# Patient Record
Sex: Male | Born: 1955 | Race: Black or African American | Hispanic: No | Smoking: Current every day smoker
Health system: Southern US, Community
[De-identification: ages and names within clinical notes are randomized; demographics above are authoritative.]

## PROBLEM LIST (undated history)

## (undated) DIAGNOSIS — R635 Abnormal weight gain: Secondary | ICD-10-CM

## (undated) DIAGNOSIS — I498 Other specified cardiac arrhythmias: Secondary | ICD-10-CM

## (undated) DIAGNOSIS — I10 Essential (primary) hypertension: Secondary | ICD-10-CM

## (undated) DIAGNOSIS — I509 Heart failure, unspecified: Secondary | ICD-10-CM

## (undated) DIAGNOSIS — J449 Chronic obstructive pulmonary disease, unspecified: Secondary | ICD-10-CM

## (undated) DIAGNOSIS — E785 Hyperlipidemia, unspecified: Secondary | ICD-10-CM

## (undated) DIAGNOSIS — I160 Hypertensive urgency: Secondary | ICD-10-CM

## (undated) DIAGNOSIS — N529 Male erectile dysfunction, unspecified: Secondary | ICD-10-CM

## (undated) DIAGNOSIS — Z720 Tobacco use: Secondary | ICD-10-CM

## (undated) DIAGNOSIS — R05 Cough: Secondary | ICD-10-CM

## (undated) DIAGNOSIS — F1010 Alcohol abuse, uncomplicated: Secondary | ICD-10-CM

## (undated) DIAGNOSIS — M25511 Pain in right shoulder: Secondary | ICD-10-CM

## (undated) HISTORY — DX: Pain in right shoulder: M25.511

## (undated) HISTORY — DX: Essential (primary) hypertension: I10

## (undated) HISTORY — DX: Tobacco use: Z72.0

## (undated) HISTORY — PX: COLONOSCOPY: SHX174

## (undated) HISTORY — DX: Hypertensive urgency: I16.0

## (undated) HISTORY — DX: Hyperlipidemia, unspecified: E78.5

## (undated) HISTORY — DX: Abnormal weight gain: R63.5

## (undated) HISTORY — DX: Heart failure, unspecified: I50.9

## (undated) HISTORY — DX: Male erectile dysfunction, unspecified: N52.9

## (undated) HISTORY — DX: Cough: R05

## (undated) HISTORY — DX: Other specified cardiac arrhythmias: I49.8

## (undated) HISTORY — DX: Alcohol abuse, uncomplicated: F10.10

---

## 1998-01-30 ENCOUNTER — Encounter: Admission: RE | Admit: 1998-01-30 | Discharge: 1998-01-30 | Payer: Self-pay | Admitting: Internal Medicine

## 1998-05-21 ENCOUNTER — Encounter: Admission: RE | Admit: 1998-05-21 | Discharge: 1998-05-21 | Payer: Self-pay | Admitting: Internal Medicine

## 1999-01-25 ENCOUNTER — Encounter: Admission: RE | Admit: 1999-01-25 | Discharge: 1999-01-25 | Payer: Self-pay | Admitting: Internal Medicine

## 1999-02-12 ENCOUNTER — Encounter: Admission: RE | Admit: 1999-02-12 | Discharge: 1999-02-12 | Payer: Self-pay | Admitting: Internal Medicine

## 2000-01-21 ENCOUNTER — Encounter: Admission: RE | Admit: 2000-01-21 | Discharge: 2000-01-21 | Payer: Self-pay | Admitting: Internal Medicine

## 2001-01-12 ENCOUNTER — Encounter: Admission: RE | Admit: 2001-01-12 | Discharge: 2001-01-12 | Payer: Self-pay | Admitting: Internal Medicine

## 2002-01-25 ENCOUNTER — Encounter: Admission: RE | Admit: 2002-01-25 | Discharge: 2002-01-25 | Payer: Self-pay | Admitting: Internal Medicine

## 2002-10-18 ENCOUNTER — Encounter: Admission: RE | Admit: 2002-10-18 | Discharge: 2002-10-18 | Payer: Self-pay | Admitting: Internal Medicine

## 2002-10-28 ENCOUNTER — Encounter: Admission: RE | Admit: 2002-10-28 | Discharge: 2002-10-28 | Payer: Self-pay | Admitting: Internal Medicine

## 2003-02-28 ENCOUNTER — Encounter: Admission: RE | Admit: 2003-02-28 | Discharge: 2003-02-28 | Payer: Self-pay | Admitting: Internal Medicine

## 2003-05-30 ENCOUNTER — Encounter: Admission: RE | Admit: 2003-05-30 | Discharge: 2003-05-30 | Payer: Self-pay | Admitting: Internal Medicine

## 2003-05-30 ENCOUNTER — Ambulatory Visit (HOSPITAL_COMMUNITY): Admission: RE | Admit: 2003-05-30 | Discharge: 2003-05-30 | Payer: Self-pay | Admitting: Internal Medicine

## 2003-06-16 ENCOUNTER — Encounter: Admission: RE | Admit: 2003-06-16 | Discharge: 2003-06-16 | Payer: Self-pay | Admitting: Internal Medicine

## 2003-10-31 ENCOUNTER — Encounter: Admission: RE | Admit: 2003-10-31 | Discharge: 2003-10-31 | Payer: Self-pay | Admitting: Internal Medicine

## 2004-01-01 ENCOUNTER — Encounter: Admission: RE | Admit: 2004-01-01 | Discharge: 2004-01-01 | Payer: Self-pay | Admitting: Internal Medicine

## 2004-01-05 ENCOUNTER — Encounter: Admission: RE | Admit: 2004-01-05 | Discharge: 2004-01-05 | Payer: Self-pay | Admitting: Internal Medicine

## 2004-04-08 ENCOUNTER — Ambulatory Visit: Payer: Self-pay | Admitting: Internal Medicine

## 2004-12-24 ENCOUNTER — Ambulatory Visit: Payer: Self-pay | Admitting: Internal Medicine

## 2004-12-27 ENCOUNTER — Ambulatory Visit: Payer: Self-pay | Admitting: Internal Medicine

## 2005-09-19 ENCOUNTER — Ambulatory Visit: Payer: Self-pay | Admitting: Internal Medicine

## 2005-10-28 ENCOUNTER — Ambulatory Visit (HOSPITAL_COMMUNITY): Admission: RE | Admit: 2005-10-28 | Discharge: 2005-10-28 | Payer: Self-pay | Admitting: Internal Medicine

## 2005-10-28 ENCOUNTER — Ambulatory Visit: Payer: Self-pay | Admitting: Internal Medicine

## 2005-11-11 ENCOUNTER — Ambulatory Visit (HOSPITAL_COMMUNITY): Admission: RE | Admit: 2005-11-11 | Discharge: 2005-11-11 | Payer: Self-pay | Admitting: Internal Medicine

## 2005-11-11 ENCOUNTER — Encounter (INDEPENDENT_AMBULATORY_CARE_PROVIDER_SITE_OTHER): Payer: Self-pay | Admitting: Cardiology

## 2005-11-28 ENCOUNTER — Ambulatory Visit: Payer: Self-pay | Admitting: Internal Medicine

## 2006-07-27 DIAGNOSIS — F17200 Nicotine dependence, unspecified, uncomplicated: Secondary | ICD-10-CM | POA: Insufficient documentation

## 2006-07-27 DIAGNOSIS — E785 Hyperlipidemia, unspecified: Secondary | ICD-10-CM | POA: Insufficient documentation

## 2006-07-27 DIAGNOSIS — F1021 Alcohol dependence, in remission: Secondary | ICD-10-CM | POA: Insufficient documentation

## 2006-07-27 DIAGNOSIS — F528 Other sexual dysfunction not due to a substance or known physiological condition: Secondary | ICD-10-CM | POA: Insufficient documentation

## 2006-07-27 DIAGNOSIS — R635 Abnormal weight gain: Secondary | ICD-10-CM | POA: Insufficient documentation

## 2006-07-27 DIAGNOSIS — I10 Essential (primary) hypertension: Secondary | ICD-10-CM | POA: Insufficient documentation

## 2006-11-16 ENCOUNTER — Encounter (INDEPENDENT_AMBULATORY_CARE_PROVIDER_SITE_OTHER): Payer: Self-pay | Admitting: Internal Medicine

## 2006-11-20 ENCOUNTER — Telehealth (INDEPENDENT_AMBULATORY_CARE_PROVIDER_SITE_OTHER): Payer: Self-pay | Admitting: *Deleted

## 2006-11-26 ENCOUNTER — Telehealth: Payer: Self-pay | Admitting: *Deleted

## 2006-12-15 ENCOUNTER — Ambulatory Visit: Payer: Self-pay | Admitting: Internal Medicine

## 2006-12-23 LAB — CONVERTED CEMR LAB
ALT: 30 units/L (ref 0–53)
AST: 28 units/L (ref 0–37)
Albumin: 4.7 g/dL (ref 3.5–5.2)
Alkaline Phosphatase: 106 units/L (ref 39–117)
BUN: 9 mg/dL (ref 6–23)
CO2: 27 meq/L (ref 19–32)
Calcium: 9.9 mg/dL (ref 8.4–10.5)
Chloride: 104 meq/L (ref 96–112)
Cholesterol: 120 mg/dL (ref 0–200)
Creatinine, Ser: 1.02 mg/dL (ref 0.40–1.50)
Glucose, Bld: 92 mg/dL (ref 70–99)
HDL: 41 mg/dL (ref >39)
LDL Cholesterol: 61 mg/dL (ref 0–99)
Potassium: 4.5 meq/L (ref 3.5–5.3)
Sodium: 142 meq/L (ref 135–145)
Total Bilirubin: 0.4 mg/dL (ref 0.3–1.2)
Total CHOL/HDL Ratio: 2.9
Total Protein: 7.2 g/dL (ref 6.0–8.3)
Triglycerides: 89 mg/dL (ref <150)
VLDL: 18 mg/dL (ref 0–40)

## 2007-01-06 ENCOUNTER — Encounter (INDEPENDENT_AMBULATORY_CARE_PROVIDER_SITE_OTHER): Payer: Self-pay | Admitting: Internal Medicine

## 2007-01-22 ENCOUNTER — Encounter (INDEPENDENT_AMBULATORY_CARE_PROVIDER_SITE_OTHER): Payer: Self-pay | Admitting: Internal Medicine

## 2007-08-24 ENCOUNTER — Ambulatory Visit: Payer: Self-pay | Admitting: Internal Medicine

## 2007-08-24 DIAGNOSIS — T7840XA Allergy, unspecified, initial encounter: Secondary | ICD-10-CM | POA: Insufficient documentation

## 2007-10-15 ENCOUNTER — Telehealth: Payer: Self-pay | Admitting: *Deleted

## 2007-12-20 ENCOUNTER — Telehealth: Payer: Self-pay | Admitting: *Deleted

## 2007-12-28 ENCOUNTER — Ambulatory Visit: Payer: Self-pay | Admitting: Internal Medicine

## 2007-12-31 ENCOUNTER — Ambulatory Visit: Payer: Self-pay | Admitting: Internal Medicine

## 2007-12-31 LAB — CONVERTED CEMR LAB
ALT: 20 units/L (ref 0–53)
AST: 27 units/L (ref 0–37)
Albumin: 4.8 g/dL (ref 3.5–5.2)
Alkaline Phosphatase: 102 units/L (ref 39–117)
BUN: 15 mg/dL (ref 6–23)
CO2: 23 meq/L (ref 19–32)
Calcium: 9.5 mg/dL (ref 8.4–10.5)
Chloride: 108 meq/L (ref 96–112)
Cholesterol: 119 mg/dL (ref 0–200)
Creatinine, Ser: 0.93 mg/dL (ref 0.40–1.50)
Glucose, Bld: 99 mg/dL (ref 70–99)
HDL: 39 mg/dL — ABNORMAL LOW (ref >39)
LDL Cholesterol: 67 mg/dL (ref 0–99)
Potassium: 4.3 meq/L (ref 3.5–5.3)
Sodium: 144 meq/L (ref 135–145)
Total Bilirubin: 0.4 mg/dL (ref 0.3–1.2)
Total CHOL/HDL Ratio: 3.1
Total Protein: 7.5 g/dL (ref 6.0–8.3)
Triglycerides: 67 mg/dL (ref <150)
VLDL: 13 mg/dL (ref 0–40)

## 2008-01-11 ENCOUNTER — Ambulatory Visit: Payer: Self-pay | Admitting: Internal Medicine

## 2008-01-12 LAB — CONVERTED CEMR LAB
BUN: 12 mg/dL (ref 6–23)
CO2: 26 meq/L (ref 19–32)
Calcium: 9.6 mg/dL (ref 8.4–10.5)
Chloride: 107 meq/L (ref 96–112)
Creatinine, Ser: 0.97 mg/dL (ref 0.40–1.50)
Glucose, Bld: 110 mg/dL — ABNORMAL HIGH (ref 70–99)
Potassium: 4 meq/L (ref 3.5–5.3)
Sodium: 143 meq/L (ref 135–145)

## 2008-03-07 ENCOUNTER — Ambulatory Visit: Payer: Self-pay | Admitting: Internal Medicine

## 2008-03-07 DIAGNOSIS — M79609 Pain in unspecified limb: Secondary | ICD-10-CM | POA: Insufficient documentation

## 2008-03-08 LAB — CONVERTED CEMR LAB
BUN: 11 mg/dL (ref 6–23)
CO2: 26 meq/L (ref 19–32)
Calcium: 9.9 mg/dL (ref 8.4–10.5)
Chloride: 107 meq/L (ref 96–112)
Creatinine, Ser: 1.1 mg/dL (ref 0.40–1.50)
Glucose, Bld: 96 mg/dL (ref 70–99)
Magnesium: 2.2 mg/dL (ref 1.5–2.5)
Potassium: 4 meq/L (ref 3.5–5.3)
Sodium: 142 meq/L (ref 135–145)
TSH: 1.227 microintl units/mL (ref 0.350–4.50)
Total CK: 439 units/L — ABNORMAL HIGH (ref 7–232)
Vitamin B-12: 613 pg/mL (ref 211–911)

## 2008-04-25 ENCOUNTER — Ambulatory Visit: Payer: Self-pay | Admitting: Internal Medicine

## 2008-04-28 ENCOUNTER — Ambulatory Visit: Payer: Self-pay | Admitting: Internal Medicine

## 2008-05-05 LAB — CONVERTED CEMR LAB
Cholesterol: 194 mg/dL (ref 0–200)
HDL: 45 mg/dL (ref >39)
LDL Cholesterol: 127 mg/dL — ABNORMAL HIGH (ref 0–99)
Total CHOL/HDL Ratio: 4.3
Total CK: 498 units/L — ABNORMAL HIGH (ref 7–232)
Triglycerides: 110 mg/dL (ref <150)
VLDL: 22 mg/dL (ref 0–40)

## 2008-05-22 ENCOUNTER — Encounter (INDEPENDENT_AMBULATORY_CARE_PROVIDER_SITE_OTHER): Payer: Self-pay | Admitting: Internal Medicine

## 2008-06-30 ENCOUNTER — Ambulatory Visit: Payer: Self-pay | Admitting: Internal Medicine

## 2008-07-03 LAB — CONVERTED CEMR LAB
Cholesterol: 116 mg/dL (ref 0–200)
HDL: 36 mg/dL — ABNORMAL LOW (ref >39)
LDL Cholesterol: 64 mg/dL (ref 0–99)
Total CHOL/HDL Ratio: 3.2
Total CK: 499 units/L — ABNORMAL HIGH (ref 7–232)
Triglycerides: 80 mg/dL (ref <150)
VLDL: 16 mg/dL (ref 0–40)

## 2008-12-26 ENCOUNTER — Ambulatory Visit: Payer: Self-pay | Admitting: Internal Medicine

## 2008-12-27 DIAGNOSIS — R748 Abnormal levels of other serum enzymes: Secondary | ICD-10-CM | POA: Insufficient documentation

## 2008-12-27 LAB — CONVERTED CEMR LAB
ALT: 26 units/L (ref 0–53)
AST: 33 units/L (ref 0–37)
Albumin: 4.3 g/dL (ref 3.5–5.2)
Alkaline Phosphatase: 107 units/L (ref 39–117)
BUN: 12 mg/dL (ref 6–23)
CO2: 24 meq/L (ref 19–32)
Calcium: 9.6 mg/dL (ref 8.4–10.5)
Chloride: 106 meq/L (ref 96–112)
Creatinine, Ser: 1 mg/dL (ref 0.40–1.50)
GFR calc Af Amer: >60 mL/min (ref >60)
GFR calc non Af Amer: >60 mL/min (ref >60)
Glucose, Bld: 101 mg/dL — ABNORMAL HIGH (ref 70–99)
Potassium: 4.1 meq/L (ref 3.5–5.3)
Sodium: 141 meq/L (ref 135–145)
Total Bilirubin: 0.3 mg/dL (ref 0.3–1.2)
Total CK: 606 units/L — ABNORMAL HIGH (ref 7–232)
Total Protein: 6.9 g/dL (ref 6.0–8.3)

## 2009-01-01 ENCOUNTER — Telehealth: Payer: Self-pay

## 2009-02-13 ENCOUNTER — Ambulatory Visit: Payer: Self-pay | Admitting: Internal Medicine

## 2009-02-14 LAB — CONVERTED CEMR LAB
BUN: 12 mg/dL (ref 6–23)
CO2: 26 meq/L (ref 19–32)
Calcium: 9.1 mg/dL (ref 8.4–10.5)
Chloride: 107 meq/L (ref 96–112)
Creatinine, Ser: 0.94 mg/dL (ref 0.40–1.50)
Glucose, Bld: 88 mg/dL (ref 70–99)
Potassium: 4.1 meq/L (ref 3.5–5.3)
Sodium: 138 meq/L (ref 135–145)

## 2009-03-08 ENCOUNTER — Encounter (INDEPENDENT_AMBULATORY_CARE_PROVIDER_SITE_OTHER): Payer: Self-pay | Admitting: Internal Medicine

## 2009-03-08 LAB — HM COLONOSCOPY

## 2009-07-14 HISTORY — PX: LYMPH NODE BIOPSY: SHX201

## 2009-11-15 ENCOUNTER — Telehealth: Payer: Self-pay | Admitting: Internal Medicine

## 2010-02-22 ENCOUNTER — Ambulatory Visit: Payer: Self-pay | Admitting: Internal Medicine

## 2010-02-28 ENCOUNTER — Telehealth: Payer: Self-pay | Admitting: Licensed Clinical Social Worker

## 2010-03-06 ENCOUNTER — Encounter: Payer: Self-pay | Admitting: Licensed Clinical Social Worker

## 2010-03-29 ENCOUNTER — Ambulatory Visit: Payer: Self-pay | Admitting: Internal Medicine

## 2010-04-12 ENCOUNTER — Ambulatory Visit: Payer: Self-pay | Admitting: Internal Medicine

## 2010-04-12 LAB — CONVERTED CEMR LAB
BUN: 9 mg/dL (ref 6–23)
CO2: 27 meq/L (ref 19–32)
Calcium: 9.4 mg/dL (ref 8.4–10.5)
Chloride: 104 meq/L (ref 96–112)
Cholesterol: 100 mg/dL (ref 0–200)
Creatinine, Ser: 1.04 mg/dL (ref 0.40–1.50)
Glucose, Bld: 90 mg/dL (ref 70–99)
HDL: 42 mg/dL (ref >39)
LDL Cholesterol: 48 mg/dL (ref 0–99)
Potassium: 4.5 meq/L (ref 3.5–5.3)
Sodium: 141 meq/L (ref 135–145)
Total CHOL/HDL Ratio: 2.4
Total CK: 453 units/L — ABNORMAL HIGH (ref 7–232)
Triglycerides: 48 mg/dL (ref <150)
VLDL: 10 mg/dL (ref 0–40)

## 2010-07-30 ENCOUNTER — Telehealth (INDEPENDENT_AMBULATORY_CARE_PROVIDER_SITE_OTHER): Payer: Self-pay | Admitting: *Deleted

## 2010-07-30 ENCOUNTER — Telehealth: Payer: Self-pay | Admitting: *Deleted

## 2010-08-05 ENCOUNTER — Emergency Department (HOSPITAL_COMMUNITY)
Admission: EM | Admit: 2010-08-05 | Discharge: 2010-08-05 | Payer: Self-pay | Source: Home / Self Care | Admitting: Family Medicine

## 2010-08-15 NOTE — Progress Notes (Addendum)
Summary: refill/gg  Phone Note Refill Request  on July 30, 2010 4:51 PM  Refills Requested: Medication #1:  NORVASC 10 MG TABS Take 1 tablet by mouth once a day  Medication #2:  CRESTOR 10 MG TABS Take 1 tablet by mouth once a day  Medication #3:  COREG 6.25 MG TABS Take 1 tablet by mouth two times a day  Method Requested: Electronic Initial call taken by: Merrie Roof RN,  July 30, 2010 4:52 PM  Follow-up for Phone Call        Refilled electronically. Please schedule an appointment with patient's primary MD.  Follow-up by: Margarito Liner MD,  August 02, 2010 3:43 PM    Prescriptions: COREG 6.25 MG TABS (CARVEDILOL) Take 1 tablet by mouth two times a day  #62 x 1   Entered and Authorized by:   Margarito Liner MD   Signed by:   Margarito Liner MD on 08/02/2010   Method used:   Electronically to        [facility] (retail)       [address]       [place], [place]  [id number]       Ph: [phone]       Fax: [phone]   RxID:   [id number] CRESTOR 10 MG TABS (ROSUVASTATIN CALCIUM) Take 1 tablet by mouth once a day  #31 Tablet x 1   Entered and Authorized by:   Margarito Liner MD   Signed by:   Margarito Liner MD on 08/02/2010   Method used:   Electronically to        [facility] (retail)       [address]       [place], [place]  [address]       Ph: [phone]       Fax: [phone]   RxID:   [id number] NORVASC 10 MG TABS (AMLODIPINE BESYLATE) Take 1 tablet by mouth once a day  #31 Tablet x 1   Entered and Authorized by:   Margarito Liner MD   Signed by:   Margarito Liner MD on 08/02/2010   Method used:   Electronically to        [facility] (retail)       [address]       [place], [place]  [address]       Ph: [phone]       Fax: [phone]   RxID:   [id number]

## 2010-08-15 NOTE — Progress Notes (Signed)
Summary: medrefill/gp  Phone Note Refill Request Message from:  Fax from Pharmacy on December 20, 2007 9:57 AM  Refills Requested: Medication #1:  CRESTOR 10 MG TABS Take 1 tablet by mouth once a day   Last Refilled: 11/04/2007  Method Requested: electronic Initial call taken by: Chinita Pester RN,  December 20, 2007 9:57 AM  Follow-up for Phone Call        I sent refills but please call pt and tell him he needs to come in for FASTING labs in the next 1-2 weeks. Doesn't have to see me, just get labs done. Thanks. Follow-up by: Ned Grace MD,  December 20, 2007 10:18 AM  Additional Follow-up for Phone Call Additional follow up Details #1::        Pt. called - no one at home/ no answering machine. I will send flag to Chilon's desk top to make an appt. Additional Follow-up by: Chinita Pester RN,  December 20, 2007 3:12 PM      Prescriptions: CRESTOR 10 MG TABS (ROSUVASTATIN CALCIUM) Take 1 tablet by mouth once a day  #30 x 5   Entered and Authorized by:   Ned Grace MD   Signed by:   Ned Grace MD on 12/20/2007   Method used:   Electronically sent to ...       [id number]       [address]       [place], [place]  [address]       Ph: [phone] or [phone]       Fax: [phone]   RxID:   [id number]

## 2010-08-15 NOTE — Consult Note (Signed)
Summary: Ortho-Dr. Yisroel Ramming  Ortho-Dr. Yisroel Ramming   Imported By: Dorice Lamas 12/16/2006 10:31:54  _____________________________________________________________________  External Attachment:    Type:   Image     Comment:   External Document

## 2010-08-15 NOTE — Assessment & Plan Note (Signed)
Summary: Devon Vega/2 WEEK RECHECK/CH   Vital Signs:  Patient profile:   55 year old male Height:      66 inches (167.64 cm) Weight:      167.0 pounds (75.91 kg) BMI:     27.05 Temp:     98.5 degrees F (36.94 degrees C) oral Pulse rate:   65 / minute BP sitting:   130 / 83  (left arm)  Vitals Entered By: Chinita Pester RN (April 12, 2010 11:22 AM) CC: F/u visit- started on new BP at the last visit; need refill. Request something for cold or allergies. Is Patient Diabetic? No Pain Assessment Patient in pain? no      Nutritional Status BMI of 25 - 29 = overweight  Have you ever been in a relationship where you felt threatened, hurt or afraid?No   Does patient need assistance? Functional Status Self care Ambulation Normal   Primary Care Provider:  Vassie Loll MD  CC:  F/u visit- started on new BP at the last visit; need refill. Request something for cold or allergies..  History of Present Illness: 55 y/o man with HTN, HLD return for follow up. Recently started on coreg for BP control after he could not tolerate other 1st line medication. continues to drink 6 beers every weekend. no other complaints.       Depression History:      The patient denies a depressed mood most of the day and a diminished interest in his usual daily activities.         Preventive Screening-Counseling & Management  Alcohol-Tobacco     Alcohol drinks/day: moderation     Smoking Status: current     Smoking Cessation Counseling: yes     Packs/Day: 1/2 a day     Year Started: 35 years  Caffeine-Diet-Exercise     Does Patient Exercise: yes     Type of exercise: BIKE  Current Medications (verified): 1)  Norvasc 10 Mg Tabs (Amlodipine Besylate) .... Take 1 Tablet By Mouth Once A Day 2)  Crestor 10 Mg Tabs (Rosuvastatin Calcium) .... Take 1 Tablet By Mouth Once A Day 3)  Nasonex 50 Mcg/act Susp (Mometasone Furoate) .... 2 Sprays Each Nostril Once Daily As Needed For Allergy Symptoms 4)   Coreg 6.25 Mg Tabs (Carvedilol) .... Take 1 Tablet By Mouth Two Times A Day  Allergies (verified): 1)  ! Codeine 2)  ! Prinivil (Lisinopril) 3)  ! Hydrochlorothiazide (Hydrochlorothiazide)  Review of Systems  The patient denies anorexia, fever, weight loss, weight gain, vision loss, decreased hearing, hoarseness, chest pain, syncope, dyspnea on exertion, peripheral edema, prolonged cough, headaches, hemoptysis, abdominal pain, melena, hematochezia, severe indigestion/heartburn, hematuria, incontinence, genital sores, muscle weakness, suspicious skin lesions, transient blindness, difficulty walking, depression, unusual weight change, abnormal bleeding, enlarged lymph nodes, angioedema, breast masses, and testicular masses.    Physical Exam  General:  alert and well-developed.  alert and well-developed.   Head:  normocephalic.  normocephalic.   Neck:  supple.   Lungs:  normal respiratory effort and normal breath sounds.  normal respiratory effort and normal breath sounds.   Heart:  normal rate and regular rhythm.  normal rate and regular rhythm.   Abdomen:  soft and non-tender.  soft and non-tender.   Pulses:  dorsalis pedis pulses normal bilaterally  Extremities:  no edema Neurologic:  OrientedX3, cranial nerver 2-12 intact,strength good in all extremities, sensations normal to light touch, reflexes 2+ b/l, gait normal    Impression & Recommendations:  Problem #  1:  HYPERTENSION (ICD-401.9) well controlled/   His updated medication list for this problem includes:    Norvasc 10 Mg Tabs (Amlodipine besylate) .Marland Kitchen... Take 1 tablet by mouth once a day    Coreg 6.25 Mg Tabs (Carvedilol) .Marland Kitchen... Take 1 tablet by mouth two times a day  Orders: T-Basic Metabolic Panel ([id number])  BP today: 130/83 Prior BP: 152/87 (03/29/2010)  Labs Reviewed: K+: 4.1 (02/13/2009) Creat: : 0.94 (02/13/2009)   Chol: 116 (06/30/2008)   HDL: 36 (06/30/2008)   LDL: 64 (06/30/2008)   TG: 80  (06/30/2008)  Problem # 2:  HYPERLIPIDEMIA (ICD-272.4) will check lipid profile   His updated medication list for this problem includes:    Crestor 10 Mg Tabs (Rosuvastatin calcium) .Marland Kitchen... Take 1 tablet by mouth once a day  Orders: T-Lipid Profile ([id number])  Problem # 3:  CPK, ABNORMAL (ICD-790.5) will check level again as statin has been restarted.   Orders: T-CK Total [phone])  Problem # 4:  ALCOHOL ABUSE, HX OF (ICD-V11.3) counselled extensively agains alcahol and smoking.   Complete Medication List: 1)  Norvasc 10 Mg Tabs (Amlodipine besylate) .... Take 1 tablet by mouth once a day 2)  Crestor 10 Mg Tabs (Rosuvastatin calcium) .... Take 1 tablet by mouth once a day 3)  Nasonex 50 Mcg/act Susp (Mometasone furoate) .... 2 sprays each nostril once daily as needed for allergy symptoms 4)  Coreg 6.25 Mg Tabs (Carvedilol) .... Take 1 tablet by mouth two times a day 5)  Guaifenesin 100 Mg/29ml Syrp (Guaifenesin) .Marland Kitchen.. 1-2 times a day for cough  Patient Instructions: 1)  Please schedule a follow-up appointment in 2 months. 2)  Get plenty of rest, drink lots of clear liquids, and use Tylenol or Ibuprofen for fever and comfort. Return in 7-10 days if you're not better:sooner if you're feeling worse. Prescriptions: COREG 6.25 MG TABS (CARVEDILOL) Take 1 tablet by mouth two times a day  #62 x 3   Entered and Authorized by:   Bethel Born MD   Signed by:   Bethel Born MD on 04/12/2010   Method used:   Electronically to        [facility] (retail)       [address]       [place], [place]  [id number]       Ph: [phone]       Fax: [phone]   RxID:   [id number] GUAIFENESIN 100 MG/5ML SYRP (GUAIFENESIN) 1-2 times a day for cough  #1 x 1   Entered and Authorized by:   Bethel Born MD   Signed by:   Bethel Born MD on 04/12/2010   Method used:   Electronically to        [facility] (retail)       [address]       [place], [place]  [id number]       Ph: [phone]       Fax: [phone]   RxID:   [id number] CHERATUSSIN AC 100-10 MG/5ML SYRP (GUAIFENESIN-CODEINE) Take 1 tablet by mouth once a day  #15 x 0   Entered and Authorized by:   Bethel Born MD   Signed by:   Bethel Born MD on 04/12/2010   Method used:   Print then Give to Patient   RxID:   [id number]   Prevention & Chronic Care Immunizations   Influenza vaccine: Not documented   Influenza vaccine deferral: Refused  (04/12/2010)  Tetanus booster: Not documented   Td booster deferral: Refused  (04/12/2010)    Pneumococcal vaccine: Not documented  Colorectal Screening   Hemoccult: Not documented    Colonoscopy: Results: Hemorrhoids.  Results: Diverticulosis.   Location:  [facility].     (03/08/2009)   Colonoscopy action/deferral: Repeat colonoscopy in 10 years.    (03/08/2009)  Other Screening   PSA: Not documented   Smoking status: current  (04/12/2010)   Smoking cessation counseling: yes  (04/12/2010)  Lipids   Total Cholesterol: 116  (06/30/2008)   Lipid panel action/deferral: Lipid Panel ordered   LDL: 64  (06/30/2008)   LDL Direct: Not documented   HDL: 36  (06/30/2008)   Triglycerides: 80  (06/30/2008)    SGOT (AST): 33  (12/26/2008)   BMP action: Ordered   SGPT (ALT): 26  (12/26/2008)   Alkaline phosphatase: 107  (12/26/2008)   Total bilirubin: 0.3  (12/26/2008)  Hypertension   Last Blood Pressure: 130 / 83  (04/12/2010)   Serum creatinine: 0.94  (02/13/2009)   BMP action: Ordered   Serum potassium 4.1  (02/13/2009)  Self-Management Support :   Personal Goals (by the next clinic visit) :      Personal blood pressure goal: 140/90  (02/22/2010)     Personal LDL goal: 70  (02/22/2010)    Patient will work on the following items until the next clinic visit to reach self-care goals:     Medications and monitoring: take my medicines every day, check my blood pressure,  bring all of my medications to every visit  (04/12/2010)     Eating: eat more vegetables, use fresh or frozen vegetables, eat foods that are low in salt, eat baked foods instead of fried foods  (04/12/2010)     Activity: take a 30 minute walk every day  (04/12/2010)    Hypertension self-management support: Education handout, Written self-care plan  (04/12/2010)   Hypertension self-care plan printed.   Hypertension education handout printed    Lipid self-management support: Education handout, Written self-care plan  (04/12/2010)   Lipid self-care plan printed.   Lipid education handout printed  Process Orders Check Orders Results:     [facility]: ABN not required for this insurance Tests Sent for requisitioning (April 12, 2010 5:48 PM):     04/12/2010: [facility] -- T-Lipid Profile [id number] (signed)     04/12/2010: [facility] -- T-Basic Metabolic Panel [id number] (signed)     04/12/2010: [facility] -- T-CK Total [[id number]] (signed)     Process Orders Check Orders Results:     [facility]: ABN not required for this insurance Tests Sent for requisitioning (April 12, 2010 5:48 PM):     04/12/2010: [facility] -- T-Lipid Profile [phone] (signed)     04/12/2010: [facility] -- T-Basic Metabolic Panel [phone] (signed)     04/12/2010: [facility] -- T-CK Total [[id number]] (signed)

## 2010-08-15 NOTE — Assessment & Plan Note (Signed)
Summary: physical   Vital Signs:  Patient Profile:   55 Years Old Male Height:     66 inches (167.64 cm) Weight:      172.9 pounds BMI:     28.01 Temp:     97.6 degrees F oral Pulse rate:   66 / minute BP sitting:   150 / 88  (right arm)  Pt. in pain?   no  Vitals Entered By: Filomena Jungling NT II (December 28, 2007 4:54 PM)              Nutritional Status BMI of 25 - 29 = overweight  Have you ever been in a relationship where you felt threatened, hurt or afraid?No   Does patient need assistance? Functional Status Self care Ambulation Normal     Chief Complaint:  CHECKUP.  History of Present Illness: Mr Devon Vega is a 55 yo man who is in today for follow up of his medical problems. 1. HTN - Taking Norvasc 10 mg once daily. Last saw pt 2/09 and BP was elevated. Pt instructed to use his home BP monitor and call with BP's over a 2 week period. He did not do this and is just returning for follow up. Has home BP monitor but only occassionally checks his BP. Thinks the numbers have been   ~ 160-170's/ DBP's  ~ 70-82.  2. Tobacco abuse - Continues to smoke  ~ 1ppd. 3. Hyperlipidemia - Taking statin daily without side effects.     Prior Medications Reviewed Using: Patient Recall  Prior Medication List:  NORVASC 10 MG TABS (AMLODIPINE BESYLATE) Take 1 tablet by mouth once a day CRESTOR 10 MG TABS (ROSUVASTATIN CALCIUM) Take 1 tablet by mouth once a day NASONEX 50 MCG/ACT  SUSP (MOMETASONE FUROATE) 2 sprays each nostril once daily TESSALON PERLES 100 MG  CAPS (BENZONATATE) Take 1 tablet by mouth three times a day FEXOFENADINE HCL 180 MG  TABS (FEXOFENADINE HCL) Take 1 tablet by mouth once a day   Current Allergies: ! CODEINE    Risk Factors: Tobacco use:  current    Year started:  35 years    Cigarettes:  Yes -- 1/2 a day pack(s) per day Alcohol use:  no Exercise:  no   Review of Systems  General      Denies chills and fever.  Eyes      Denies vision loss-both eyes.  CV      Denies chest pain or discomfort and shortness of breath with exertion.  Resp      Denies cough, shortness of breath, and sputum productive.  GI      Denies abdominal pain and change in bowel habits.  GU      Denies discharge and dysuria.  MS      Denies joint pain.  Derm      Denies rash.  Neuro      Denies numbness and weakness.  Psych      Denies depression.   Physical Exam  General:     alert, well-developed, and well-nourished.   Lungs:     normal respiratory effort, normal breath sounds, no crackles, and no wheezes.   Heart:     normal rate and regular rhythm.   Abdomen:     soft, non-tender, and normal bowel sounds.   Extremities:     No edema Neurologic:     alert & oriented X3, strength normal in all extremities, and gait normal.   Skin:     no  edema.   Psych:     Oriented X3, normally interactive, good eye contact, and not depressed appearing.      Impression & Recommendations:  Problem # 1:  HYPERTENSION (ICD-401.9) Pt's BP remains poorly controlled on Norvasc at maximum dose. Will add additional med. Pt had erectile dysfunction on HCTZ/Reserpine. Both meds can cause this and pt may tolerate HCTZ alone. However, he does not want to try this. Therefore, will add Lasix 20 mg once daily. Will return in 3 days for labs (need fasting labs as well). Then will follow up for labs and BP check in 2 weeks. Rx sent. His updated medication list for this problem includes:    Norvasc 10 Mg Tabs (Amlodipine besylate) .Marland Kitchen... Take 1 tablet by mouth once a day    Furosemide 20 Mg Tabs (Furosemide) .Marland Kitchen... Take 1 tablet by mouth once a day   Problem # 2:  TOBACCO ABUSE (ICD-305.1) I once again discussed the importance of smoking cessation with pt, including medications to help. He states he is not interested in trying to quit at this time.  Problem # 3:  HYPERLIPIDEMIA (ICD-272.4) Return for FLP as well as hepatic labs. Will adjust med if needed pending results  of labs. His updated medication list for this problem includes:    Crestor 10 Mg Tabs (Rosuvastatin calcium) .Marland Kitchen... Take 1 tablet by mouth once a day  Future Orders: T-Lipid Profile ([id number]) ... 12/31/2007   Complete Medication List: 1)  Norvasc 10 Mg Tabs (Amlodipine besylate) .... Take 1 tablet by mouth once a day 2)  Crestor 10 Mg Tabs (Rosuvastatin calcium) .... Take 1 tablet by mouth once a day 3)  Nasonex 50 Mcg/act Susp (Mometasone furoate) .... 2 sprays each nostril once daily 4)  Tessalon Perles 100 Mg Caps (Benzonatate) .... Take 1 tablet by mouth three times a day 5)  Fexofenadine Hcl 180 Mg Tabs (Fexofenadine hcl) .... Take 1 tablet by mouth once a day 6)  Furosemide 20 Mg Tabs (Furosemide) .... Take 1 tablet by mouth once a day  Other Orders: Future Orders: T-Comprehensive Metabolic Panel ([id number]) ... 12/31/2007   Patient Instructions: 1)  Please schedule a follow-up appointment in 2 weeks (in June, not July) for a blood pressure check and labs. 2)  Please schedule an appointment for labs on June 19th (Friday). Do not eat anything before you come. 3)  Start taking the Lasix pill once a day in the morning. 4)  Keep taking the Norvasc as well.   Prescriptions: NORVASC 10 MG TABS (AMLODIPINE BESYLATE) Take 1 tablet by mouth once a day  #31 x 5   Entered and Authorized by:   Ned Grace MD   Signed by:   Ned Grace MD on 12/28/2007   Method used:   Electronically sent to ...       [id number]       [address]       [place], [place]  [address]       Ph: [phone] or [phone]       Fax: [phone]   RxID:   [id number] FUROSEMIDE 20 MG  TABS (FUROSEMIDE) Take 1 tablet by mouth once a day  #31 x 5   Entered and Authorized by:   Ned Grace MD   Signed by:   Ned Grace MD on 12/28/2007   Method used:   Electronically sent to .[facility] Kitchen[id number]       [address]       [place], [place]  [address]       Ph: [phone] or [phone]       Fax: [phone]   RxID:   [id number]  ]  Appended Document: physical   Impression & Recommendations:  Problem # 1:  HYPERTENSION (ICD-401.9) Potassium 4.3 and creatinine 0.93. Repeat in 2 weeks after starting Lasix. His updated medication list for this problem includes:    Norvasc 10 Mg Tabs (Amlodipine besylate) .Marland Kitchen... Take 1 tablet by mouth once a day    Furosemide 20 Mg Tabs (Furosemide) .Marland Kitchen... Take 1 tablet by mouth once a day   Problem # 2:  HYPERLIPIDEMIA (ICD-272.4) FLP: total chol 119/ LDL 67/ HDL 39/ TG 67. Hepatic labs WNL. Continue current dose of Crestor. His updated medication list for this problem includes:    Crestor 10 Mg Tabs (Rosuvastatin calcium) .Marland Kitchen... Take 1 tablet by mouth once a day   Complete Medication List: 1)  Norvasc 10 Mg Tabs (Amlodipine besylate) .... Take 1 tablet by mouth once a day 2)  Crestor 10 Mg Tabs (Rosuvastatin calcium) .... Take 1 tablet by mouth once a day 3)  Nasonex 50 Mcg/act Susp (Mometasone furoate) .... 2 sprays each nostril once daily 4)  Tessalon Perles 100 Mg Caps (Benzonatate) .... Take 1 tablet by mouth three times a day 5)  Fexofenadine Hcl 180 Mg Tabs (Fexofenadine hcl) .... Take 1 tablet by mouth once a day 6)  Furosemide 20 Mg Tabs (Furosemide) .... Take 1 tablet by mouth once a day

## 2010-08-15 NOTE — Assessment & Plan Note (Signed)
Summary: Soc. Work  Smoking Cessation Counseling. 40 min.  Met with patient in my office for counseling.   The patient reported smoking since age 55.  He smokes about 10 cigs per day.  He tried to quit about 4 years ago using the patches and had some success.  He quit for 3 months and then went back.   His reasons for quitting include to reduce coughing.  Also, his father died from lung cancer three years ago.  He said that he smokes because of habit and stress.   The patient works second shift and can smoke on breaks.  He lives with his 10 yo aunt who is a non-smoker. He does not have children and is divorced.   The patient is not yet ready to come up with a quit date and so we discussed preparation for a quit date which would include pricing out the patches, coming up with substitute activities, walking, cleaning out both his home and car and making smokefree. The patient also seems receptive to joining the quitline.    Tip sheet given to patient with my card and quitline info.  He also has letter that he can give friends and family so that they know how to best support him in his quest to quit smoking.   Appended Document: Soc. Work Patient will contact me by phone when he comes up with a quitdate for follow-up counseling.

## 2010-08-15 NOTE — Progress Notes (Signed)
Summary: meds/gg  Phone Note Call from Patient   Caller: Patient Summary of Call: Devon Vega does not like the tessalon perles for his cough, they are not helping.  He called and requested a syrup.  please advise. Initial call taken by: Merrie Roof RN,  October 15, 2007 2:06 PM  Follow-up for Phone Call        His cough is due to allergies. Tell him to be sure he is using the allegra and nasonex. He can try on OTC cough syrup if he likes but I don't want to prescribe a Rx cough syrup like Tussionex to be used for seasonal allergies. Follow-up by: Ned Grace MD,  October 15, 2007 4:27 PM  Additional Follow-up for Phone Call Additional follow up Details #1::        Pt informed Additional Follow-up by: Merrie Roof RN,  October 15, 2007 4:33 PM

## 2010-08-15 NOTE — Assessment & Plan Note (Signed)
Summary: follow-uo visit   Vital Signs:  Patient Profile:   55 Years Old Male Height:     66 inches (167.64 cm) Weight:      169.3 pounds (76.95 kg) BMI:     27.42 Temp:     98.1 degrees F (36.72 degrees C) oral Pulse rate:   5 / minute BP sitting:   152 / 88  (right arm)  Pt. in pain?   no  Vitals Entered By: Filomena Jungling NT II (April 25, 2008 11:57 AM)              Is Patient Diabetic? No Nutritional Status BMI of 25 - 29 = overweight  Have you ever been in a relationship where you felt threatened, hurt or afraid?No   Does patient need assistance? Functional Status Self care Ambulation Normal     Chief Complaint:  talkabout cholersterol medications.  History of Present Illness: Devon Vega is a 55 yo man who is in today for follow up of his blood pressure and leg pain.  1. Leg pain - Pt had CK of 439 so Crestor held. Pain has completely gone away after stopping Crestor. 2. HTN - Pt taking Norvasc and Lasix daily. 3. Hyperlipidemia - Currently on no meds. Has also had to stop Lipitor in the past for the same reason (leg pain).  4. Tobacco abuse - Continues to smoke.     Updated Prior Medication List: NORVASC 10 MG TABS (AMLODIPINE BESYLATE) Take 1 tablet by mouth once a day NASONEX 50 MCG/ACT  SUSP (MOMETASONE FUROATE) 2 sprays each nostril once daily TESSALON PERLES 100 MG  CAPS (BENZONATATE) Take 1 tablet by mouth three times a day FEXOFENADINE HCL 180 MG  TABS (FEXOFENADINE HCL) Take 1 tablet by mouth once a day FUROSEMIDE 20 MG  TABS (FUROSEMIDE) Take 1/2 tablet by mouth once a day  Current Allergies: ! CODEINE    Risk Factors: Tobacco use:  current    Year started:  35 years    Cigarettes:  Yes -- 1/2 a day pack(s) per day Alcohol use:  no Exercise:  no   Review of Systems  General      Denies chills and fever.  CV      Denies chest pain or discomfort and palpitations.  Resp      Denies cough and sputum productive.  GI      Denies  abdominal pain and change in bowel habits.  GU      Denies dysuria.  MS      Denies joint pain and muscle aches.  Derm      Denies rash.  Neuro      Denies numbness and weakness.  Psych      Denies depression.   Physical Exam  General:     alert, well-developed, well-nourished, and normal appearance.   Lungs:     normal respiratory effort and normal breath sounds.   Heart:     normal rate and regular rhythm.   Abdomen:     soft, non-tender, and normal bowel sounds.   Msk:     normal ROM, no joint tenderness, and no joint swelling.   Neurologic:     alert & oriented X3, strength normal in all extremities, and gait normal.      Impression & Recommendations:  Problem # 1:  LEG PAIN, RIGHT (ICD-729.5) Suspect related to statin as pain resolved when Crestor stopped.  Check CK to be sure it has normalized. Orders: T-CK Total (  [phone])   Problem # 2:  HYPERTENSION (ICD-401.9) BP today not optimally controlled though was better at previous visit on same meds. Will likely need additional med.  Pt refuses HCTZ (impotence when on HCTZ & Reserpine in past). HR too slow for beta-blocker. Will  try increased dose of Lasix. If this doesn't control BP, will add ACE.  His updated medication list for this problem includes:    Norvasc 10 Mg Tabs (Amlodipine besylate) .Marland Kitchen... Take 1 tablet by mouth once a day    Furosemide 20 Mg Tabs (Furosemide) .Marland Kitchen... Take 1 tablet by mouth once a day  BP today: 152/88 Prior BP: 135/84 (03/07/2008)  Labs Reviewed: Creat: 1.10 (03/07/2008) Chol: 119 (12/31/2007)   HDL: 39 (12/31/2007)   LDL: 67 (12/31/2007)   TG: 67 (12/31/2007)   Problem # 3:  HYPERLIPIDEMIA (ICD-272.4) Suspect pt will have problems with other statins (already failed 2). Discussed the option of Niacin but pt hesitant when side effects described. Will check FLP now that he is off Crestor and then make decision re restarting med.  The following medications were removed from  the medication list:    Crestor 10 Mg Tabs (Rosuvastatin calcium) .Marland Kitchen... Take 1 tablet by mouth once a day  Orders: T-Lipid Profile [phone])  Labs Reviewed: Chol: 119 (12/31/2007)   HDL: 39 (12/31/2007)   LDL: 67 (12/31/2007)   TG: 67 (12/31/2007) SGOT: 27 (12/31/2007)   SGPT: 20 (12/31/2007)   Problem # 4:  TOBACCO ABUSE (ICD-305.1) Once again discussed the importance of smoking cessation and he again expresses understanding.   Complete Medication List: 1)  Norvasc 10 Mg Tabs (Amlodipine besylate) .... Take 1 tablet by mouth once a day 2)  Nasonex 50 Mcg/act Susp (Mometasone furoate) .... 2 sprays each nostril once daily 3)  Tessalon Perles 100 Mg Caps (Benzonatate) .... Take 1 tablet by mouth three times a day 4)  Fexofenadine Hcl 180 Mg Tabs (Fexofenadine hcl) .... Take 1 tablet by mouth once a day 5)  Furosemide 20 Mg Tabs (Furosemide) .... Take 1 tablet by mouth once a day   Patient Instructions: 1)  Please schedule an appointment for Friday, October 16 to have labs done. 2)  Do not eat or drink anything after midnight on Thursday until you have your labs drawn on Friday morning.    ]

## 2010-08-15 NOTE — Progress Notes (Signed)
Summary: Soc. 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 Work Call placed to: Patient Summary of Call: Patient will visit with me on Wed Aug 24th at 11 AM.

## 2010-08-15 NOTE — Assessment & Plan Note (Signed)
Summary: 2WK FU/EST/VS   Vital Signs:  Patient Profile:   55 Years Old Male Height:     66 inches (167.64 cm) Weight:      173.3 pounds (78.77 kg) BMI:     28.07 Temp:     97.0 degrees F (36.11 degrees C) oral Pulse rate:   69 / minute BP sitting:   137 / 81  (right arm)  Pt. in pain?   no  Vitals Entered By: Filomena Jungling NT II (January 11, 2008 4:44 PM)              Is Patient Diabetic? No Nutritional Status BMI of 25 - 29 = overweight  Have you ever been in a relationship where you felt threatened, hurt or afraid?No   Does patient need assistance? Functional Status Self care Ambulation Normal     Chief Complaint:  FOLLOW-UP.  History of Present Illness: Mr Devon Vega is a 55 yo man who is in today for BP check after starting Lasix. Already on Norvasc. Doing well. No problems with Lasix.     Current Allergies: ! 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   Lungs:     normal respiratory effort and normal breath sounds.   Heart:     normal rate and regular rhythm.      Impression & Recommendations:  Problem # 1:  HYPERTENSION (ICD-401.9) Good response to addition of Lasix. Will continue current dose for now. BMET to check K and creatinine. Follow up 4-6 weeks. His updated medication list for this problem includes:    Norvasc 10 Mg Tabs (Amlodipine besylate) .Marland Kitchen... Take 1 tablet by mouth once a day    Furosemide 20 Mg Tabs (Furosemide) .Marland Kitchen... Take 1 tablet by mouth once a day   Problem # 2:  HYPERLIPIDEMIA (ICD-272.4) FLP just done: Total  chol 119 / LDL 67 / HDL 39. Hepatic labs ok. Continue Crestor. His updated medication list for this problem includes:    Crestor 10 Mg Tabs (Rosuvastatin calcium) .Marland Kitchen... Take 1 tablet by mouth once a day   Problem # 3:  TOBACCO ABUSE (ICD-305.1) Once again discussed smoking cessation. Gave 1-800 quit # for help with this. Also pointed out money he would save which seemed to get his attention.  Complete Medication List: 1)  Norvasc 10 Mg Tabs (Amlodipine besylate) .... Take 1 tablet by mouth once a day 2)  Crestor 10 Mg Tabs (Rosuvastatin calcium) .... Take 1 tablet by mouth once a day 3)  Nasonex 50 Mcg/act Susp (Mometasone furoate) .... 2 sprays each nostril once daily 4)  Tessalon Perles 100 Mg Caps (Benzonatate) .... Take 1 tablet by mouth three times a day 5)  Fexofenadine Hcl 180 Mg Tabs (Fexofenadine hcl) .... Take 1 tablet by mouth once a day 6)  Furosemide 20 Mg Tabs (Furosemide) .... Take 1 tablet by mouth once a day  Other Orders: T-Basic Metabolic Panel [id number])   Patient Instructions: 1)  Please schedule a follow-up appointment in 4-6 weeks. 2)  Keep taking both the Lasix and the Norvasc. 3)  Tobacco is very bad for your health and your loved ones! You Should stop smoking!. 4)  Stop Smoking Tips: Choose a Quit date. Cut down before the Quit date. decide what you will do as a substitute when you feel the urge to smoke(gum,toothpick,exercise). 5)  CALL 1-800- QUIT NOW for help when you are ready to quit smoking.   ]

## 2010-08-15 NOTE — Progress Notes (Signed)
  Phone Note Call from Patient Call back at Home Phone [phone]   Caller: Patient Call For: Devon Sine Phifer MD Reason for Call: Talk to Doctor Details for Reason: MEDICATION Summary of Call: MR. Mcgourty CALLED IN ON FRIDAY JUNE 19,2010 about the new medication he was perscribed for his blood pressure is making him cough the patient did stop the medication I told the patient I would let DR.PHIFER know. Initial call taken by: Filomena Jungling NT II,  January 01, 2009 10:32 AM  Follow-up for Phone Call        Please call and ask if the cough went away when he stopped the medicine. If so, I will call him in a different one that won't make him cough. If not, he can continue the medicine. Also, ask if that is why he is requesting a cough medicine?  Follow-up by: Ned Grace MD,  January 01, 2009 3:31 PM  Additional Follow-up for Phone Call Additional follow up Details #1::        patient said even thugh he stoped the medicine he is still coughing he thinks it is the new medicine,he wouldliketotry something else. Additional Follow-up by: Berish Bohman NT II,  January 02, 2009 8:59 AM    Additional Follow-up for Phone Call Additional follow up Details #2::    PATIENT COUGHPlease ask him to wait until the cough is completely gone then retry the Lisinopril. If the cough comes back then, we will change him to a different medicine. I want to be sure it is the Lisinopril causing the cough. I called in Tessalon Perles for him to take for the cough. Follow-up by: Ned Grace MD,  January 02, 2009 12:11 PM

## 2010-08-15 NOTE — Assessment & Plan Note (Signed)
Summary: ACUTE-MEDICATION REFILLS/CFB([facility]ERA)   Vital Signs:  Patient profile:   55 year old male Height:      66 inches (167.64 cm) Weight:      166.5 pounds (75.68 kg) BMI:     26.97 Temp:     98.1 degrees F (36.72 degrees C) oral Pulse rate:   81 / minute BP sitting:   156 / 87  (left arm) Cuff size:   regular  Vitals Entered By: Theotis Barrio NT II (February 22, 2010 3:27 PM) CC: YEARLY MEDICATION REFILL Is Patient Diabetic? No Pain Assessment Patient in pain? no      Nutritional Status BMI of 25 - 29 = overweight  Have you ever been in a relationship where you felt threatened, hurt or afraid?No   Does patient need assistance? Functional Status Self care Ambulation Normal Comments YEARLY MEDICATION REFILL    CC:  YEARLY MEDICATION REFILL.  History of Present Illness: Mr Hitsman is a 55 yo man who is in today for a blood pressure check.   1. HTN - Taking all meds as directed. BP elevated.   3. Tobacco use - Says he now realizes he needs to quit smoking.   Patient is feeling well and denies CP, abdominal pain, nausea, vomiting, HA's, palpitations, blurred vision. fever, chills, diarrhea, constipation or SOB.   Preventive Screening-Counseling & Management  Alcohol-Tobacco     Smoking Status: current     Smoking Cessation Counseling: yes     Packs/Day: 1/2 a day     Year Started: 35 years  Caffeine-Diet-Exercise     Does Patient Exercise: yes     Type of exercise: BIKE  Current Medications (verified): 1)  Norvasc 10 Mg Tabs (Amlodipine Besylate) .... Take 1 Tablet By Mouth Once A Day 2)  Fexofenadine Hcl 180 Mg  Tabs (Fexofenadine Hcl) .... Take 1 Tablet By Mouth Once A Day 3)  Crestor 10 Mg Tabs (Rosuvastatin Calcium) .... Take 1 Tablet By Mouth Once A Day 4)  Nasonex 50 Mcg/act Susp (Mometasone Furoate) .... 2 Sprays Each Nostril Once Daily As Needed For Allergy Symptoms 5)  Losartan Potassium 50 Mg Tabs (Losartan Potassium) .... Take 1 Tablet By Mouth  Once A Day  Allergies: 1)  ! Codeine 2)  ! Prinivil (Lisinopril) 3)  ! Hydrochlorothiazide (Hydrochlorothiazide)  Social History: Does Patient Exercise:  yes  Review of Systems       As per HPI  Physical Exam  General:  alert and healthy-appearing.   Neck:  no cervical lymphadenopathy.   Lungs:  normal respiratory effort, normal breath sounds, and no wheezes.   Heart:  normal rate and regular rhythm.   Abdomen:  soft, non-tender, and normal bowel sounds.   Msk:  normal ROM, no joint tenderness, and no joint swelling.   Extremities:  No edema Neurologic:  alert & oriented X3, strength normal in all extremities, and gait normal.   Skin:  no rashes.   Psych:  Oriented X3 and normally interactive.     Impression & Recommendations:  Problem # 1:  HYPERTENSION (ICD-401.9) BP elevated, will add losartan 50, and bring back in 1 month for BMET, and make any adjustments needed.   The following medications were removed from the medication list:    Furosemide 20 Mg Tabs (Furosemide) .Marland Kitchen... Take 1 tablet by mouth once a day    Lisinopril 20 Mg Tabs (Lisinopril) .Marland Kitchen... Take 1 tablet by mouth once a day His updated medication list for this problem includes:  Norvasc 10 Mg Tabs (Amlodipine besylate) .Marland Kitchen... Take 1 tablet by mouth once a day    Losartan Potassium 50 Mg Tabs (Losartan potassium) .Marland Kitchen... Take 1 tablet by mouth once a day  Future Orders: T-Basic Metabolic Panel [id number]) ... 03/22/2010  BP today: 156/87 Prior BP: 130/80 (02/13/2009)  Labs Reviewed: K+: 4.1 (02/13/2009) Creat: : 0.94 (02/13/2009)   Chol: 116 (06/30/2008)   HDL: 36 (06/30/2008)   LDL: 64 (06/30/2008)   TG: 80 (06/30/2008)  Problem # 2:  TOBACCO ABUSE (ICD-305.1) Patient was counseled on smoking cessation strategies including medications and behavior modification options. Patient said he was ready to stop smoking at this time.  will make SW referral.   Orders: Social Work Referral (Social )  Problem  # 3:  HYPERLIPIDEMIA (ICD-272.4) last checked was well controlled, will bring back in one month for lft and flp.   His updated medication list for this problem includes:    Crestor 10 Mg Tabs (Rosuvastatin calcium) .Marland Kitchen... Take 1 tablet by mouth once a day  Future Orders: T-Lipid Profile ([id number]) ... 03/22/2010  Labs Reviewed: SGOT: 33 (12/26/2008)   SGPT: 26 (12/26/2008)   HDL:36 (06/30/2008), 45 (04/28/2008)  LDL:64 (06/30/2008), 127 (25/36/6440)  Chol:116 (06/30/2008), 194 (04/28/2008)  Trig:80 (06/30/2008), 110 (04/28/2008)  Problem # 4:  IMPOTENCE (ICD-302.72) resolved off hctz.   Complete Medication List: 1)  Norvasc 10 Mg Tabs (Amlodipine besylate) .... Take 1 tablet by mouth once a day 2)  Fexofenadine Hcl 180 Mg Tabs (Fexofenadine hcl) .... Take 1 tablet by mouth once a day 3)  Crestor 10 Mg Tabs (Rosuvastatin calcium) .... Take 1 tablet by mouth once a day 4)  Nasonex 50 Mcg/act Susp (Mometasone furoate) .... 2 sprays each nostril once daily as needed for allergy symptoms 5)  Losartan Potassium 50 Mg Tabs (Losartan potassium) .... Take 1 tablet by mouth once a day  Other Orders: Future Orders: T-Hepatic Function ([id number]) ... 03/22/2010  Patient Instructions: 1)  Please schedule a follow-up appointment in 1 month. Prescriptions: LOSARTAN POTASSIUM 50 MG TABS (LOSARTAN POTASSIUM) Take 1 tablet by mouth once a day  #30 x 0   Entered and Authorized by:   Darnelle Maffucci MD   Signed by:   Darnelle Maffucci MD on 02/22/2010   Method used:   Electronically to        [facility] [address] (retail)       [address]       [place], [place]  [id number]       Ph: [phone]       Fax: [phone]   RxID:   [id number]  Process Orders Check Orders Results:     [facility]: ABN not required for this insurance Tests Sent for requisitioning (February 22, 2010 4:38 PM):     03/22/2010: [facility] -- T-Lipid Profile  [phone] (signed)     03/22/2010: [facility] -- T-Hepatic Function [phone] (signed)     03/22/2010: [facility] -- T-Basic Metabolic Panel [phone] (signed)     Prevention & Chronic Care Immunizations   Influenza vaccine: Not documented    Tetanus booster: Not documented    Pneumococcal vaccine: Not documented  Colorectal Screening   Hemoccult: Not documented    Colonoscopy: Results: Hemorrhoids.  Results: Diverticulosis.   Location:  [facility].     (03/08/2009)   Colonoscopy action/deferral: Repeat colonoscopy in 10 years.    (03/08/2009)  Other Screening   PSA: Not documented  Smoking status: current  (02/22/2010)   Smoking cessation counseling: yes  (02/22/2010)  Lipids   Total Cholesterol: 116  (06/30/2008)   Lipid panel action/deferral: Lipid Panel ordered   LDL: 64  (06/30/2008)   LDL Direct: Not documented   HDL: 36  (06/30/2008)   Triglycerides: 80  (06/30/2008)    SGOT (AST): 33  (12/26/2008)   BMP action: Ordered   SGPT (ALT): 26  (12/26/2008)   Alkaline phosphatase: 107  (12/26/2008)   Total bilirubin: 0.3  (12/26/2008)    Lipid flowsheet reviewed?: Yes   Progress toward LDL goal: At goal  Hypertension   Last Blood Pressure: 156 / 87  (02/22/2010)   Serum creatinine: 0.94  (02/13/2009)   Serum potassium 4.1  (02/13/2009)    Hypertension flowsheet reviewed?: Yes   Progress toward BP goal: Unchanged  Self-Management Support :   Personal Goals (by the next clinic visit) :      Personal blood pressure goal: 140/90  (02/22/2010)     Personal LDL goal: 70  (02/22/2010)    Patient will work on the following items until the next clinic visit to reach self-care goals:     Medications and monitoring: take my medicines every day  (02/22/2010)     Eating: drink diet soda or water instead of juice or soda, eat more vegetables, use fresh or frozen vegetables, eat foods that are low in salt,  eat baked foods instead of fried foods, eat fruit for snacks and desserts, limit or avoid alcohol  (02/22/2010)    Hypertension self-management support: Resources for patients handout, Written self-care plan  (02/22/2010)   Hypertension self-care plan printed.    Lipid self-management support: Resources for patients handout, Written self-care plan  (02/22/2010)   Lipid self-care plan printed.    Self-management comments: PATIENT RIDE A Stage manager.

## 2010-08-15 NOTE — Miscellaneous (Signed)
  Clinical Lists Changes  Medications: Added new medication of CRESTOR 10 MG TABS (ROSUVASTATIN CALCIUM) Take 1 tablet by mouth once a day

## 2010-08-15 NOTE — Miscellaneous (Signed)
Summary: Orders Update  Clinical Lists Changes  Orders: Added new Test order of T-Lipid Profile [phone]) - Signed Added new Test order of T-CK Total [phone]) - Signed

## 2010-08-15 NOTE — Consult Note (Signed)
Summary: [facility]:Dr. Jerl Santos  [facility]:Dr. Jerl Santos   Imported By: Florinda Marker 03/05/2007 09:34:59  _____________________________________________________________________  External Attachment:    Type:   Image     Comment:   External Document

## 2010-08-15 NOTE — Progress Notes (Signed)
Summary: Refill/gh  Phone Note Refill Request Message from:  Fax from Pharmacy on Nov 15, 2009 11:08 AM  Refills Requested: Medication #1:  NORVASC 10 MG TABS Take 1 tablet by mouth once a day  Medication #2:  LISINOPRIL 20 MG TABS Take 1 tablet by mouth once a day  Medication #3:  CRESTOR 10 MG TABS Take 1 tablet by mouth once a day  Method Requested: Electronic Initial call taken by: Angelina Ok RN,  Nov 15, 2009 11:09 AM  Follow-up for Phone Call        Refill approved. Make sure patient is follow in the clinic near soon; he has not been follow since August 2010.    Prescriptions: LISINOPRIL 20 MG TABS (LISINOPRIL) Take 1 tablet by mouth once a day  #31 x 3   Entered and Authorized by:   Vassie Loll MD   Signed by:   Vassie Loll MD on 11/15/2009   Method used:   Electronically to        [facility] (retail)       [address]       [place], [place]  [id number]       Ph: [phone]       Fax: [phone]   RxID:   [id number] CRESTOR 10 MG TABS (ROSUVASTATIN CALCIUM) Take 1 tablet by mouth once a day  #31 Tablet x 3   Entered and Authorized by:   Vassie Loll MD   Signed by:   Vassie Loll MD on 11/15/2009   Method used:   Electronically to        [facility] (retail)       [address]       [place], [place]  [id number]       Ph: [phone]       Fax: [phone]   RxID:   [id number] NORVASC 10 MG TABS (AMLODIPINE BESYLATE) Take 1 tablet by mouth once a day  #31 Tablet x 3   Entered and Authorized by:   Vassie Loll MD   Signed by:   Vassie Loll MD on 11/15/2009   Method used:   Electronically to        [facility] (retail)       [address]       [place], [place]  [id number]       Ph: [phone]       Fax: [phone]   RxID:   [id number]

## 2010-08-15 NOTE — Assessment & Plan Note (Signed)
Summary: ACUTE/MADERA/1 MONTH F/U VISIT PER TOBBIA/CH   Vital Signs:  Patient profile:   55 year old male Height:      66 inches Weight:      167.5 pounds BMI:     27.13 Temp:     98.7 degrees F oral Pulse rate:   71 / minute BP sitting:   152 / 87  (right arm)  Vitals Entered By: Filomena Jungling NT II (March 29, 2010 3:34 PM) CC: BLOOD PRESSURE FOLLOW-UP Is Patient Diabetic? No Pain Assessment Patient in pain? no      Nutritional Status BMI of 25 - 29 = overweight  Does patient need assistance? Functional Status Self care Ambulation Normal   CC:  BLOOD PRESSURE FOLLOW-UP.  History of Present Illness: Devon Vega is a 55 yo man with pmh significant for HTN and HLD who presents today for a blood pressure check.   1. HTN - Pt is taking Norvasc as directed but was not able to take the losartan due to nausea and sluggish feeling. States he took losartan for 1 week then stopped.  In the past patient has taken lisinopril (stopped due to cough) and HCTZ (also stopped due to impotence).  Patient is feeling well and denies CP, abdominal pain, nausea, vomiting, HA's, palpitations, blurred vision. fever, chills, diarrhea, constipation or SOB.   Preventive Screening-Counseling & Management  Alcohol-Tobacco     Smoking Status: current     Smoking Cessation Counseling: yes     Packs/Day: 1/2 a day     Year Started: 35 years  Caffeine-Diet-Exercise     Does Patient Exercise: yes     Type of exercise: BIKE  Current Medications (verified): 1)  Norvasc 10 Mg Tabs (Amlodipine Besylate) .... Take 1 Tablet By Mouth Once A Day 2)  Crestor 10 Mg Tabs (Rosuvastatin Calcium) .... Take 1 Tablet By Mouth Once A Day 3)  Nasonex 50 Mcg/act Susp (Mometasone Furoate) .... 2 Sprays Each Nostril Once Daily As Needed For Allergy Symptoms  Allergies (verified): 1)  ! Codeine 2)  ! Prinivil (Lisinopril) 3)  ! 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) Current Smoker- 1 PPD x 40 years Alcohol use-6 pk beer over the weekend  Regular exercise-walks  ~30 minutes daily Divorced - married 1yrs, no children  Risk Factors: Exercise: yes (03/29/2010)  Risk Factors: Smoking Status: current (03/29/2010) Packs/Day: 1/2 a day (03/29/2010)  Social History: 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  alert and well-developed.   Head:  normocephalic.  normocephalic.   Neck:  supple.  supple.   Lungs:  normal respiratory effort and normal breath sounds.  normal respiratory effort and normal breath sounds.   Heart:  normal rate and regular rhythm.  normal rate and regular rhythm.   Abdomen:  soft and non-tender.  soft and non-tender.   Msk:  normal  ROM.  normal ROM.   Extremities:  no LE edema present  Neurologic:  alert & oriented X3.  alert & oriented X3.     Impression & Recommendations:  Problem # 1:  HYPERTENSION (ICD-401.9) Assessment Unchanged BP unchanged due to pt not taking 2nd antihypertensive. As mentioned in HPI, Losartan was causing nausea and "sluggish" feelings. Unsure if this is a true medication side effect. Will start patient on Coreg at low dose, and have pt follow up in 2 weeks. Would also consider telmisartan if pt does not tolerate Coreg, as  I'm not sure if his side effects is secondary to Losartan itself or family of ARBs.  Will check BMet at next visit.   The following medications were removed from the medication list:    Losartan Potassium 50 Mg Tabs (Losartan potassium) .Marland Kitchen... Take 1 tablet by mouth once a day His updated medication list for this problem includes:    Norvasc 10 Mg Tabs (Amlodipine besylate) .Marland Kitchen... Take 1 tablet by mouth once a day    Coreg 6.25 Mg Tabs (Carvedilol) .Marland Kitchen... Take 1 tablet by mouth two times a day  Problem # 2:  TOBACCO ABUSE (ICD-305.1) Once again stressed the importance of smoking cessation. Pt voices understanding and states he is trying to cut back.  Complete Medication List: 1)  Norvasc 10 Mg Tabs (Amlodipine besylate) .... Take 1 tablet by mouth once a day 2)  Crestor 10 Mg Tabs (Rosuvastatin calcium) .... Take 1 tablet by mouth once a day 3)  Nasonex 50 Mcg/act Susp (Mometasone furoate) .... 2 sprays each nostril once daily as needed for allergy symptoms 4)  Coreg 6.25 Mg Tabs (Carvedilol) .... Take 1 tablet by mouth two times a day  Patient Instructions: 1)  Please schedule a follow-up appointment in 2 weeks with DR [id number].  2)  You will also have blood work done at appointment.  Prescriptions: COREG 6.25 MG TABS (CARVEDILOL) Take 1 tablet by mouth two times a day  #14 x 0   Entered and Authorized by:   Melida Quitter MD   Signed by:   Melida Quitter MD on 03/29/2010   Method used:   Electronically to        [facility] (retail)       [address]       [place], [place]  [id number]       Ph: [phone]       Fax: [phone]   RxID:   [id number]   Prevention & Chronic Care Immunizations   Influenza vaccine: Not documented   Influenza vaccine deferral: Deferred  (03/29/2010)    Tetanus booster: Not documented    Pneumococcal vaccine: Not documented    Immunization comments: gets flu shot at work  Colorectal Screening   Hemoccult: Not  documented    Colonoscopy: Results: Hemorrhoids.  Results: Diverticulosis.   Location:  [facility].     (03/08/2009)   Colonoscopy action/deferral: Repeat colonoscopy in 10 years.    (03/08/2009)  Other Screening   PSA: Not documented   Smoking status: current  (03/29/2010)   Smoking cessation counseling: yes  (03/29/2010)  Lipids   Total Cholesterol: 116  (06/30/2008)   Lipid panel action/deferral: Lipid Panel ordered   LDL: 64  (06/30/2008)   LDL Direct: Not documented   HDL: 36  (06/30/2008)   Triglycerides: 80  (06/30/2008)    SGOT (AST): 33  (12/26/2008)   BMP action: Ordered   SGPT (ALT): 26  (  12/26/2008)   Alkaline phosphatase: 107  (12/26/2008)   Total bilirubin: 0.3  (12/26/2008)  Hypertension   Last Blood Pressure: 152 / 87  (03/29/2010)   Serum creatinine: 0.94  (02/13/2009)   BMP action: Ordered   Serum potassium 4.1  (02/13/2009)    Hypertension flowsheet reviewed?: Yes   Progress toward BP goal: Unchanged  Self-Management Support :   Personal Goals (by the next clinic visit) :      Personal blood pressure goal: 140/90  (02/22/2010)     Personal LDL goal: 70  (02/22/2010)    Hypertension self-management support: Resources for patients handout, Written self-care plan  (02/22/2010)    Lipid self-management support: Resources for patients handout, Written self-care plan  (02/22/2010)

## 2010-08-15 NOTE — Consult Note (Signed)
Summary: [facility].: Colonoscopy  [facility].: Colonoscopy   Imported By: Florinda Marker 03/09/2009 14:27:53  _____________________________________________________________________  External Attachment:    Type:   Image     Comment:   External Document  Appended Document: [facility].: Colonoscopy   Colonoscopy  Procedure date:  03/08/2009  Findings:      Results: Hemorrhoids.  Results: Diverticulosis.   Location:  [facility]     Comments:      Repeat colonoscopy in 10 years.     Colonoscopy  Procedure date:  03/08/2009  Findings:      Results: Hemorrhoids.  Results: Diverticulosis.   Location:  [facility]     Comments:      Repeat colonoscopy in 10 years.

## 2010-08-15 NOTE — Consult Note (Signed)
Summary: Ortho: Dr. Jerl Santos  Ortho: Dr. Jerl Santos   Imported By: Florinda Marker 01/21/2007 10:30:29  _____________________________________________________________________  External Attachment:    Type:   Image     Comment:   External Document

## 2010-08-15 NOTE — Progress Notes (Signed)
Summary: Refill request  Phone Note Refill Request Message from:  Fax from Pharmacy on Nov 20, 2006 9:18 AM  Refills Requested: Medication #1:  NORVASC 10 MG TABS Take 1 tablet by mouth once a day   Dosage confirmed as above?Dosage Confirmed   Brand Name Necessary? No   Last Refilled: 10/14/2006 Last office visit and BMET was 10/28/05.   Method Requested: Fax to Local Pharmacy Initial call taken by: Henderson Cloud,  Nov 20, 2006 9:18 AM  Follow-up for Phone Call        When was pt last seen in our office?  See prior page.  Long time.  Needs visit. Follow-up by: Ulyess Mort MD,  Nov 20, 2006 12:37 PM  Additional Follow-up for Phone Call Additional follow up Details #1::        Rx faxed to pharmacy  flag sent to [facility] to schedule visit Additional Follow-up by: Merrie Roof RN,  Nov 20, 2006 2:53 PM    Prescriptions: NORVASC 10 MG TABS (AMLODIPINE BESYLATE) Take 1 tablet by mouth once a day  #32 x 1   Entered and Authorized by:   Ulyess Mort MD   Signed by:   Ulyess Mort MD on 11/20/2006   Method used:   Telephoned to ...       [facility]       [address] [place], [place]  [address]  [place]       Ph: [phone]       Fax: [phone]   RxID:   [id number]

## 2010-08-15 NOTE — Assessment & Plan Note (Signed)
Summary: checkup   Vital Signs:  Patient profile:   55 year old male Height:      66 inches (167.64 cm) Weight:      166.5 pounds (75.68 kg) BMI:     26.97 O2 Sat:      100 % on Room air Temp:     99.1 degrees F (37.28 degrees C) oral Pulse rate:   82 / minute BP sitting:   163 / 83  (right arm)  Vitals Entered By: Filomena Jungling NT II (December 26, 2008 9:02 AM)  O2 Flow:  Room air Is Patient Diabetic? No Pain Assessment Patient in pain? no      Nutritional Status BMI of 25 - 29 = overweight  Have you ever been in a relationship where you felt threatened, hurt or afraid?No   Does patient need assistance? Functional Status Self care Ambulation Normal   History of Present Illness: Mr Devon Vega is a 55 yo man who is in today for follow up of his medical problems. 1. Allergies - Taking Allegra with some relief. Used nasal steroid inhaler in the past with relief but no longer has Rx for this. 2. Hyperlipidemia - Taking Crestor without any side effects. Denies leg pain, cramping.  3. HTN - Taking Lasix and Norvasc as directed.  4. Tobacco abuse - Still smoking. Smokes  ~1 pack/ 2 days.  Preventive Screening-Counseling & Management  Alcohol-Tobacco     Smoking Status: current     Smoking Cessation Counseling: yes     Packs/Day: 1/2 a day     Year Started: 35 years  Caffeine-Diet-Exercise     Does Patient Exercise: no  Current Medications (verified): 1)  Norvasc 10 Mg Tabs (Amlodipine Besylate) .... Take 1 Tablet By Mouth Once A Day 2)  Nasonex 50 Mcg/act  Susp (Mometasone Furoate) .... 2 Sprays Each Nostril Once Daily 3)  Tessalon Perles 100 Mg  Caps (Benzonatate) .... Take 1 Tablet By Mouth Three Times A Day 4)  Fexofenadine Hcl 180 Mg  Tabs (Fexofenadine Hcl) .... Take 1 Tablet By Mouth Once A Day 5)  Furosemide 20 Mg  Tabs (Furosemide) .... Take 1 Tablet By Mouth Once A Day 6)  Crestor 10 Mg Tabs (Rosuvastatin Calcium) .... Take 1 Tablet By Mouth Once A Day  Allergies  (verified): 1)  ! Codeine  Review of Systems General:  Denies chills and fever. CV:  Denies chest pain or discomfort and palpitations. Resp:  Denies cough and shortness of breath. GI:  Denies abdominal pain and change in bowel habits. GU:  Denies dysuria. MS:  Denies joint pain. Psych:  Denies depression.  Physical Exam  General:  alert, well-developed, and well-nourished.   Lungs:  normal respiratory effort and normal breath sounds.   Heart:  normal rate and regular rhythm.   Abdomen:  soft, non-tender, and normal bowel sounds.   Psych:  Oriented X3, memory intact for recent and remote, and normally interactive.     Impression & Recommendations:  Problem # 1:  HYPERTENSION (ICD-401.9) Uncontrolled on current regimen. Will add Lisinopril. Check K and creatinine today and in 2 weeks. Return visit for BP check in 1 month. His updated medication list for this problem includes:    Norvasc 10 Mg Tabs (Amlodipine besylate) .Marland Kitchen... Take 1 tablet by mouth once a day    Furosemide 20 Mg Tabs (Furosemide) .Marland Kitchen... Take 1 tablet by mouth once a day    Lisinopril 20 Mg Tabs (Lisinopril) .Marland Kitchen... Take 1 tablet by  mouth once a day  BP today: 163/83 Prior BP: 152/88 (04/25/2008)  Labs Reviewed: K+: 4.0 (03/07/2008) Creat: : 1.10 (03/07/2008)   Chol: 116 (06/30/2008)   HDL: 36 (06/30/2008)   LDL: 64 (06/30/2008)   TG: 80 (06/30/2008)  Problem # 2:  HYPERLIPIDEMIA (ICD-272.4) Good response to Crestor and pt tolerating med. Check transaminases today.  His updated medication list for this problem includes:    Crestor 10 Mg Tabs (Rosuvastatin calcium) .Marland Kitchen... Take 1 tablet by mouth once a day  Labs Reviewed: SGOT: 27 (12/31/2007)   SGPT: 20 (12/31/2007)   HDL:36 (06/30/2008), 45 (04/28/2008)  LDL:64 (06/30/2008), 127 (16/04/9603)  Chol:116 (06/30/2008), 194 (04/28/2008)  Trig:80 (06/30/2008), 110 (04/28/2008)  Problem # 3:  ALLERGY, ENVIRONMENTAL (ICD-995.3) Rx for Nasonex sent. Continue Allegra.    Problem # 4:  TOBACCO ABUSE (ICD-305.1) Once again discussed importance of smoking cessation at length. Gave pt information on smoking cessation class at [facility] in July and encouraged him to attend. Not interested in medications for help.  Problem # 5:  LEG PAIN, RIGHT (ICD-729.5) Unknown etiology and now resolved. Statin stopped with relief of pain but restarted and leg pain did not recur. Total CK was elevated but did not normalize with stopping statin. Will check CK today. Continue statin.   Problem # 6:  Preventive Health Care (ICD-V70.0) Refer for screening colonoscopy.   Complete Medication List: 1)  Norvasc 10 Mg Tabs (Amlodipine besylate) .... Take 1 tablet by mouth once a day 2)  Fexofenadine Hcl 180 Mg Tabs (Fexofenadine hcl) .... Take 1 tablet by mouth once a day 3)  Furosemide 20 Mg Tabs (Furosemide) .... Take 1 tablet by mouth once a day 4)  Crestor 10 Mg Tabs (Rosuvastatin calcium) .... Take 1 tablet by mouth once a day 5)  Lisinopril 20 Mg Tabs (Lisinopril) .... Take 1 tablet by mouth once a day 6)  Nasonex 50 Mcg/act Susp (Mometasone furoate) .... 2 sprays each nostril once daily as needed for allergy symptoms  Other Orders: T-CK Total ([id number]) T-Comprehensive Metabolic Panel ([id number]) Future Orders: T-Basic Metabolic Panel [id number]) ... 01/02/2009  Patient Instructions: 1)  Please schedule a follow-up appointment in 1- 2 weeks for labs. 2)  Please schedule a follow-up appointment to check your blood pressure in 1 month. 3)  Start taking Lisinopril once a day for your blood pressure. Keep taking your other blood pressure medicines as well.  4)  Use the Nasonex nasal spray 2 sprays once a day for your allergies. 5)  Tobacco is very bad for your health and your loved ones! You Should stop smoking!. 6)  Stop Smoking Tips: Choose a Quit date. Cut down before the Quit date. decide what you will do as a substitute when you feel the urge to  smoke(gum,toothpick,exercise). Prescriptions: NASONEX 50 MCG/ACT SUSP (MOMETASONE FUROATE) 2 sprays each nostril once daily as needed for allergy symptoms  #1 inhaler x 3   Entered and Authorized by:   Harriett Sine Aadhira Heffernan MD   Signed by:   Ned Grace MD on 12/26/2008   Method used:   Electronically to        [id number] (retail)       [address]       [place], [place]  [address]       Ph: [phone] or [phone]       Fax: [phone]   RxID:   [id number] LISINOPRIL 20  MG TABS (LISINOPRIL) Take 1 tablet by mouth once a day  #30 x 5   Entered and Authorized by:   Ned Grace MD   Signed by:   Ned Grace MD on 12/26/2008   Method used:   Electronically to        [id number] (retail)       [address]       [place], [place]  [address]       Ph: [phone] or [phone]       Fax: [phone]   RxID:   [id number]

## 2010-08-15 NOTE — Assessment & Plan Note (Signed)
Summary: followup visit   Vital Signs:  Patient Profile:   55 Years Old Male Height:     66 inches (167.64 cm) Weight:      170.7 pounds (77.59 kg) BMI:     27.65 Temp:     97.5 degrees F (36.39 degrees C) oral Pulse rate:   68 / minute BP sitting:   135 / 84  (right arm)  Pt. in pain?   no  Vitals Entered By: Filomena Jungling NT II (March 07, 2008 11:41 AM)              Is Patient Diabetic? No Nutritional Status BMI of 25 - 29 = overweight  Have you ever been in a relationship where you felt threatened, hurt or afraid?No   Does patient need assistance? Functional Status Self care Ambulation Normal     Chief Complaint:  BONES HURT and FEELS LIKE CRAMPS .  History of Present Illness: Devon Vega is a 55 yo man who is in today complaining of "bone pain". States that R. leg aches "to the bone". Not leg cramps. Stiffness of leg, NOT joint pain. Pt associates sx's with starting lasix. No rash. Has continued to take the Lasix. Also on Norvasc and Crestor (neither new).     Prior Medications Reviewed Using: Patient Recall  Prior Medication List:  NORVASC 10 MG TABS (AMLODIPINE BESYLATE) Take 1 tablet by mouth once a day CRESTOR 10 MG TABS (ROSUVASTATIN CALCIUM) Take 1 tablet by mouth once a day NASONEX 50 MCG/ACT  SUSP (MOMETASONE FUROATE) 2 sprays each nostril once daily TESSALON PERLES 100 MG  CAPS (BENZONATATE) Take 1 tablet by mouth three times a day FEXOFENADINE HCL 180 MG  TABS (FEXOFENADINE HCL) Take 1 tablet by mouth once a day FUROSEMIDE 20 MG  TABS (FUROSEMIDE) Take 1 tablet by mouth once a day   Current Allergies: ! CODEINE    Risk Factors: Tobacco use:  current    Year started:  33 years    Cigarettes:  Yes -- 1/2 a day pack(s) per day Alcohol use:  no Exercise:  no   Review of Systems  General      Denies chills and fever.  MS      Complains of stiffness.      Denies joint pain, cramps, and muscle weakness.      see hpi  Derm      Denies  rash.  Neuro      Denies falling down, numbness, and weakness.   Physical Exam  General:     alert, well-developed, and healthy-appearing.   Msk:     normal ROM, no joint tenderness, no joint swelling, no joint warmth, no redness over joints, and no muscle atrophy.   Neurologic:     alert & oriented X3, strength normal in all extremities, sensation intact to light touch, and gait normal.   Skin:     no rashes.      Impression & Recommendations:  Problem # 1:  LEG PAIN, RIGHT (ICD-729.5) It is not at all clear to me what is causing the pt's sx's. I cannot imagine that this is due to the lasix but the pt is convinced that it is. Agrees to try 1/2 dose lasix (see #2). Will check labs (K, Mag, CK, B-12, TSH). Follow up 1 month. Exam entirely WNL. Orders: T-CK Total [id number]) T-TSH [id number]) T-Vitamin B12 [id number])   Problem # 2:  HYPERTENSION (ICD-401.9) BP controlled on current meds but pt may not tolerate  lasix (see #1). Pt took HCTZ (in combination with reserpine) in past and had problem with impotence so not willing to try HCTZ alone. HR 60 so beta blocker not an option. Will likely add ACE if need to stop Lasix. RTC 1 mo for follow up. His updated medication list for this problem includes:    Norvasc 10 Mg Tabs (Amlodipine besylate) .Marland Kitchen... Take 1 tablet by mouth once a day    Furosemide 20 Mg Tabs (Furosemide) .Marland Kitchen... Take 1/2 tablet by mouth once a day  BP today: 135/84 Prior BP: 137/81 (01/11/2008)  Labs Reviewed: Creat: 0.97 (01/11/2008) Chol: 119 (12/31/2007)   HDL: 39 (12/31/2007)   LDL: 67 (12/31/2007)   TG: 67 (12/31/2007)   Problem # 3:  HYPERLIPIDEMIA (ICD-272.4) Has been on Crestor for sometime without any SE's. Continue.  His updated medication list for this problem includes:    Crestor 10 Mg Tabs (Rosuvastatin calcium) .Marland Kitchen... Take 1 tablet by mouth once a day   Complete Medication List: 1)  Norvasc 10 Mg Tabs (Amlodipine besylate) .... Take 1  tablet by mouth once a day 2)  Crestor 10 Mg Tabs (Rosuvastatin calcium) .... Take 1 tablet by mouth once a day 3)  Nasonex 50 Mcg/act Susp (Mometasone furoate) .... 2 sprays each nostril once daily 4)  Tessalon Perles 100 Mg Caps (Benzonatate) .... Take 1 tablet by mouth three times a day 5)  Fexofenadine Hcl 180 Mg Tabs (Fexofenadine hcl) .... Take 1 tablet by mouth once a day 6)  Furosemide 20 Mg Tabs (Furosemide) .... Take 1/2 tablet by mouth once a day  Other Orders: T-Basic Metabolic Panel [id number]) T-Magnesium [phone] [phone])   Patient Instructions: 1)  Please schedule a follow-up appointment in 1 month for a blood pressure check. 2)  Take 1/2 of a lasix pill and see if your symptoms resolve. 3)  Keep taking your other medicines as before.   ]  Vital Signs:  Patient Profile:   55 Years Old Male Height:     66 inches (167.64 cm) Weight:      170.7 pounds (77.59 kg) BMI:     27.65 Temp:     97.5 degrees F (36.39 degrees C) oral Pulse rate:   68 / minute BP sitting:   135 / 84

## 2010-08-15 NOTE — Progress Notes (Signed)
Summary: need refills on his blood pressure and cholesterol  Phone Note Call from Patient   Caller: Patient Reason for Call: Refill Medication Details for Reason: refill meds Summary of Call: Devon Vega called in for refills on his blood pressure medicines and his cholesterole mmedicine. i will send this to the refill nurse. Initial call taken by: [facility] NT II,  July 30, 2010 12:16 PM

## 2010-08-19 ENCOUNTER — Emergency Department (HOSPITAL_COMMUNITY)
Admission: EM | Admit: 2010-08-19 | Discharge: 2010-08-19 | Disposition: A | Discharge status: Home / Self Care | Payer: 59 | Attending: Emergency Medicine | Admitting: Emergency Medicine

## 2010-08-19 DIAGNOSIS — I10 Essential (primary) hypertension: Secondary | ICD-10-CM | POA: Insufficient documentation

## 2010-08-19 DIAGNOSIS — M79609 Pain in unspecified limb: Secondary | ICD-10-CM | POA: Insufficient documentation

## 2010-08-19 DIAGNOSIS — E7800 Pure hypercholesterolemia, unspecified: Secondary | ICD-10-CM | POA: Insufficient documentation

## 2010-08-19 DIAGNOSIS — M542 Cervicalgia: Secondary | ICD-10-CM | POA: Insufficient documentation

## 2010-09-09 ENCOUNTER — Other Ambulatory Visit: Payer: Self-pay | Admitting: Dermatology

## 2010-09-25 ENCOUNTER — Encounter: Payer: Self-pay | Admitting: Internal Medicine

## 2010-10-02 ENCOUNTER — Encounter: Payer: Self-pay | Admitting: Internal Medicine

## 2010-10-02 ENCOUNTER — Ambulatory Visit (INDEPENDENT_AMBULATORY_CARE_PROVIDER_SITE_OTHER): Payer: 59 | Admitting: Internal Medicine

## 2010-10-02 DIAGNOSIS — I10 Essential (primary) hypertension: Secondary | ICD-10-CM

## 2010-10-02 DIAGNOSIS — M5030 Other cervical disc degeneration, unspecified cervical region: Secondary | ICD-10-CM | POA: Insufficient documentation

## 2010-10-02 DIAGNOSIS — IMO0002 Reserved for concepts with insufficient information to code with codable children: Secondary | ICD-10-CM

## 2010-10-02 MED ORDER — AMLODIPINE BESYLATE 10 MG PO TABS: 10.0000 mg | ORAL_TABLET | Freq: Every day | ORAL | Status: DC

## 2010-10-02 MED ORDER — HYDROCODONE-ACETAMINOPHEN 5-500 MG PO TABS: 1.0000 | ORAL_TABLET | Freq: Every day | ORAL | Status: AC

## 2010-10-02 NOTE — Assessment & Plan Note (Addendum)
His BP is moderately elevated today, but he states that he hasn't taken his pills. Will ressess during next visit after 2 month. Also will refill Amlodipine today.

## 2010-10-02 NOTE — Assessment & Plan Note (Signed)
Devon Vega has Cervical Disk Degeneration since 2005 and is been followed by Orthopedic surgeons for that and also had MRI's.  He has intermittent painful episodes and his last one started in 07/2010 as per HPI. He was prescribed Oxycodone by Dr. Regino Schultze, Orthopedic and is on Gabapentin and Meloxicam prescibed by the same. Considering his pain, which is getting better, but also hampering his work, will give 30 Vicodin tablets which he intends to take 1 a day to help him work pain free. He has a f/u appointment with Dr. Regino Schultze after 2months. He is also getting PT for his neck pain now.

## 2010-10-02 NOTE — Progress Notes (Signed)
  Subjective:    Patient ID: Devon Vega, male    DOB: Nov 18, 1955, 55 y.o.   MRN: [id number]  HPI Devon Vega is a 55 yo man with PMH of HTN, HLD, cervical spine DDD , who comes to the clinic with CC of L side of neck since 07/2010.  He is been followed by Dr. Regino Schultze, Orthopedic surgeon for this and he saw him last week. He c/o the pain not letting him do his work properly and also Gabapentin makes him sleepy at work and so he doesn't want to take that in morning. His pain is dull, on L side of neck, doesn't radiate anywhere else. He had tingling and numbness in her forearm and hand initially in 07/2010, but has none now.     Review of Systems  Constitutional: Negative.   HENT: Positive for neck pain. Negative for hearing loss, ear pain, facial swelling and neck stiffness.   Eyes: Negative for pain, discharge and itching.  Respiratory: Negative.   Cardiovascular: Negative.   Gastrointestinal: Negative.   Genitourinary: Negative.   Neurological: Negative.        Objective:   Physical Exam  Constitutional: He is oriented to person, place, and time. He appears well-developed and well-nourished.  HENT:  Head: Normocephalic and atraumatic.  Nose: Nose normal.  Mouth/Throat: Oropharynx is clear and moist.  Eyes: Conjunctivae are normal. Pupils are equal, round, and reactive to light.  Neck: Normal range of motion. Neck supple. No JVD present.       Pain on increased flexion or extension.  Cardiovascular: Normal rate, regular rhythm and normal heart sounds.   No murmur heard. Pulmonary/Chest: Effort normal and breath sounds normal. No respiratory distress. He has no wheezes.  Abdominal: Soft. Bowel sounds are normal. He exhibits no distension. There is no tenderness.  Musculoskeletal: He exhibits no edema and no tenderness.  Neurological: He is alert and oriented to person, place, and time.  Skin: Skin is warm and dry.          Assessment & Plan:

## 2010-10-02 NOTE — Patient Instructions (Signed)
Please make a f/u appointment after 2 months. Please take all your medicines regularly. Also go for the physical therapy as per your Orthopedic doctor.

## 2010-10-08 ENCOUNTER — Encounter: Payer: Self-pay | Admitting: Internal Medicine

## 2010-11-04 ENCOUNTER — Other Ambulatory Visit: Payer: Self-pay | Admitting: Internal Medicine

## 2010-11-05 ENCOUNTER — Other Ambulatory Visit: Payer: Self-pay | Admitting: Internal Medicine

## 2010-11-08 NOTE — Telephone Encounter (Signed)
Rx called in 

## 2010-12-02 ENCOUNTER — Encounter: Payer: 59 | Admitting: Internal Medicine

## 2010-12-02 ENCOUNTER — Encounter: Payer: Self-pay | Admitting: Internal Medicine

## 2010-12-02 ENCOUNTER — Ambulatory Visit (INDEPENDENT_AMBULATORY_CARE_PROVIDER_SITE_OTHER): Payer: 59 | Admitting: Internal Medicine

## 2010-12-02 DIAGNOSIS — F17200 Nicotine dependence, unspecified, uncomplicated: Secondary | ICD-10-CM

## 2010-12-02 DIAGNOSIS — IMO0002 Reserved for concepts with insufficient information to code with codable children: Secondary | ICD-10-CM

## 2010-12-02 DIAGNOSIS — I10 Essential (primary) hypertension: Secondary | ICD-10-CM

## 2010-12-02 MED ORDER — CARVEDILOL 12.5 MG PO TABS: 12.5000 mg | ORAL_TABLET | Freq: Two times a day (BID) | ORAL | Status: DC

## 2010-12-02 NOTE — Patient Instructions (Addendum)
Please make a f/u Appointment in 4-6 weeks for a blood pressure check and 4-5 months followup with Dr. Allena Katz. Please continue taking all your medications regularly as you do. Please follow low salt diet and do exercise. Please start taking Coreg 12.5 mg twice daily instead of 6.5.

## 2010-12-02 NOTE — Assessment & Plan Note (Addendum)
Highly motivated to quit smoking. Already talked with Dorothe Pea for smoking cessation counseling. He says that he is working on that and has tried once but he got back up to smoking and he said that he'll try again. Explained him to call any time for help if needed for his smoking cessation.

## 2010-12-02 NOTE — Assessment & Plan Note (Signed)
Blood pressure severely elevated-180/90 and repeat blood pressure 171/100. Will increase dose of Coreg to 12.5 mg by mouth twice a day and unfortunately cannot add HCTZ or lisinopril due to allergies. Explained him to follow a low-salt diet and to do exercise yeast 30 minutes 5 times a week and also quit smoking-all of this will help lowering his blood pressure with medications. He is highly motivated to follow diet and exercise and quit smoking. Will call him back in 4-6 weeks for blood pressure check and if his elevated he will see a doctor. Otherwise I will see him back in 4-5 months.

## 2010-12-02 NOTE — Assessment & Plan Note (Signed)
Been followed by Dr. Regino Schultze for management. Getting steroid injections. Has next appointment in June 1st week.

## 2010-12-02 NOTE — Progress Notes (Signed)
  Subjective:    Patient ID: Devon Vega, male    DOB: 1955/10/17, 55 y.o.   MRN: [id number]  HPI Devon Vega is a pleasant 55 gentleman with past with a history of cervical DDD, hypertension, hyper lipidemia who comes for regular followup visit. History has some neck stiffness on the left side and tingling starting from the neck and going of his fingers. He is been followed by Dr. Regino Schultze, orthopedics and had one steroid injection recently and is going to have one more in June. The injections are helping him and he is taking meloxicam as needed for his pain. Denies any weakness of the left upper extremity. Denies any fever, chills, chest pain, short of breath, abdominal pain, cough, headache, nausea, vomiting, recent weight loss.    Review of Systems    as per history of present illness Objective:   Physical Exam    Constitutional: Vital signs reviewed.  Patient is a well-developed and well-nourishedin no acute distress and cooperative with exam. Alert and oriented x3.  Mouth: no erythema or exudates, MMM Eyes: PERRL, EOMI, conjunctivae normal, No scleral icterus.  Neck: Supple, Trachea midline normal ROM, No JVD, mass, thyromegaly, or carotid bruit present.  Cardiovascular: RRR, S1 normal, S2 normal, no MRG, pulses symmetric and intact bilaterally Pulmonary/Chest: CTAB, no wheezes, rales, or rhonchi Abdominal: Soft. Non-tender, non-distended, bowel sounds are normal, no masses, organomegaly, or guarding present.  GU: no CVA tenderness Musculoskeletal: No joint deformities, erythema, or stiffness, ROM full and no nontender Neurological: A&O x3, Strenght is normal and symmetric bilaterally, cranial nerve II-XII are grossly intact, no focal motor deficit, sensory intact to light touch bilaterally.  Skin: Warm, dry and intact. No rash, cyanosis, or clubbing.  Psychiatric: Normal mood and affect. speech and behavior is normal. Judgment and thought content normal. Cognition and memory  are normal.       Assessment & Plan:

## 2010-12-09 ENCOUNTER — Encounter: Payer: Self-pay | Admitting: Internal Medicine

## 2010-12-18 NOTE — Telephone Encounter (Signed)
Called to pharmacy 

## 2010-12-31 ENCOUNTER — Other Ambulatory Visit: Payer: Self-pay | Admitting: Internal Medicine

## 2011-01-06 ENCOUNTER — Encounter: Payer: 59 | Admitting: Internal Medicine

## 2011-01-30 NOTE — Telephone Encounter (Signed)
Refill was called to [facility] on [address].

## 2011-03-19 ENCOUNTER — Ambulatory Visit (INDEPENDENT_AMBULATORY_CARE_PROVIDER_SITE_OTHER): Payer: 59 | Admitting: Internal Medicine

## 2011-03-19 ENCOUNTER — Encounter: Payer: Self-pay | Admitting: Internal Medicine

## 2011-03-19 DIAGNOSIS — I10 Essential (primary) hypertension: Secondary | ICD-10-CM

## 2011-03-19 DIAGNOSIS — M549 Dorsalgia, unspecified: Secondary | ICD-10-CM

## 2011-03-19 DIAGNOSIS — IMO0002 Reserved for concepts with insufficient information to code with codable children: Secondary | ICD-10-CM

## 2011-03-19 MED ORDER — AMLODIPINE BESYLATE 10 MG PO TABS: 10.0000 mg | ORAL_TABLET | Freq: Every day | ORAL | Status: DC

## 2011-03-19 MED ORDER — CARVEDILOL 12.5 MG PO TABS: 12.5000 mg | ORAL_TABLET | Freq: Two times a day (BID) | ORAL | Status: DC

## 2011-03-19 MED ORDER — SPIRONOLACTONE 25 MG PO TABS: 25.0000 mg | ORAL_TABLET | Freq: Every day | ORAL | Status: DC

## 2011-03-19 MED ORDER — ROSUVASTATIN CALCIUM 10 MG PO TABS: 10.0000 mg | ORAL_TABLET | Freq: Every day | ORAL | Status: DC

## 2011-03-19 MED ORDER — HYDROCODONE-ACETAMINOPHEN 5-500 MG PO TABS: 1.0000 | ORAL_TABLET | Freq: Every day | ORAL | Status: DC | PRN

## 2011-03-19 MED ORDER — GABAPENTIN 300 MG PO CAPS: 300.0000 mg | ORAL_CAPSULE | Freq: Every day | ORAL | Status: DC

## 2011-03-19 NOTE — Progress Notes (Signed)
  Subjective:    Patient ID: Devon Vega, male    DOB: 12-17-1955, 55 y.o.   MRN: [id number]  HPI Devon Vega is a pleasant 55 year old man with past with history significant for hypertension, degenerative disc disease of cervical spine who comes the clinic for regular followup and medication refills. He denies any complaints and says that he is pretty active working regularly full-time, does walk for exercise as much as he can almost 3 to 4 times a week. Although his blood pressure today is 18293 and was 180/91 in may 2012. At that time he was advised to follow low salt diet and continue exercise but his blood pressure seems to be persistently elevated today. He denies any significant symptoms in terms of chest pain, short of breath, headache, vision changes, abdominal pain, fever, nausea, vomiting, chills. He is going to see Dr. Regino Schultze, orthopedics on coming Friday for his DDD for which he had a steroid injection earlier this year.    Review of Systems    as per history of present illness, all other systems reviewed and negative Objective:   Physical Exam Constitutional: Vital signs reviewed.  Patient is a well-developed and well-nourishedin no acute distress and cooperative with exam. Alert and oriented x3.  Head: Normocephalic and atraumatic Mouth: no erythema or exudates, MMM Eyes: PERRL, EOMI, conjunctivae normal, No scleral icterus.  Neck: Supple, Trachea midline normal ROM, No JVD, mass, thyromegaly, or carotid bruit present.  Cardiovascular: RRR, S1 normal, S2 normal, no MRG, pulses symmetric and intact bilaterally Pulmonary/Chest: CTAB, no wheezes, rales, or rhonchi Abdominal: Soft. Non-tender, non-distended, bowel sounds are normal, no masses, organomegaly, or guarding present.  Musculoskeletal: No joint deformities, erythema, or stiffness, ROM full and no nontender Neurological: A&O x3, Strenght is normal and symmetric bilaterally, cranial nerve II-XII are grossly intact, no  focal motor deficit, sensory intact to light touch bilaterally.  Skin: Warm, dry and intact. No rash, cyanosis, or clubbing.          Assessment & Plan:

## 2011-03-19 NOTE — Assessment & Plan Note (Signed)
Followed by Dr. Romero Liner appointment coming Friday. I will prescribe 30 tablets of Vicodin today for breakthrough pain-of which he requires one tablet a day. Also refill his Neurontin which was originally prescribed by Dr. Regino Schultze.

## 2011-03-19 NOTE — Assessment & Plan Note (Signed)
Conservative management after severely elevated systolic blood pressure during last visit in may 2012 did not lower her blood pressure. He says that he is rushing to go to work today and that might be the reason for his blood pressure being high-showed him the flow sheet showing his last 3 readings from March 2012 bring persistent elevated. Discussed with him the need to add new antihypertensive and unfortunately cannot add HCTZ or lisinopril due to allergies. I will check a BMP today and start him on spironolactone 25 mg by mouth daily and recheck his potassium level after a week. He will come on 03/28/2011 at 1:30 PM to get his lab test done and check his blood pressure. He would not see a doctor at that time unless otherwise required.

## 2011-03-19 NOTE — Patient Instructions (Addendum)
Please start taking Spirinolactone 25 mg once daily. Come Friday 9/14 for the blood test and also blood pressure check. Continue exercise as much as you can an and low salt diet. Make an appointment in 3-4 months.

## 2011-03-20 LAB — BASIC METABOLIC PANEL WITH GFR
BUN: 10 mg/dL (ref 6–23)
CO2: 26 mEq/L (ref 19–32)
Calcium: 9.4 mg/dL (ref 8.4–10.5)
Chloride: 104 mEq/L (ref 96–112)
Creat: 0.86 mg/dL (ref 0.50–1.35)
GFR, Est African American: >60 mL/min (ref >60)
GFR, Est Non African American: >60 mL/min (ref >60)
Glucose, Bld: 100 mg/dL — ABNORMAL HIGH (ref 70–99)
Potassium: 4.7 mEq/L (ref 3.5–5.3)
Sodium: 140 mEq/L (ref 135–145)

## 2011-03-28 ENCOUNTER — Ambulatory Visit (INDEPENDENT_AMBULATORY_CARE_PROVIDER_SITE_OTHER): Payer: 59 | Admitting: Internal Medicine

## 2011-03-28 ENCOUNTER — Encounter: Payer: Self-pay | Admitting: Internal Medicine

## 2011-03-28 VITALS — BP 150/82 | HR 69 | Temp 98.5°F | Ht 65.0 in | Wt 158.6 lb

## 2011-03-28 DIAGNOSIS — E785 Hyperlipidemia, unspecified: Secondary | ICD-10-CM

## 2011-03-28 DIAGNOSIS — I10 Essential (primary) hypertension: Secondary | ICD-10-CM

## 2011-03-28 LAB — BASIC METABOLIC PANEL WITH GFR
BUN: 9 mg/dL (ref 6–23)
CO2: 30 mEq/L (ref 19–32)
Calcium: 9.8 mg/dL (ref 8.4–10.5)
Chloride: 104 mEq/L (ref 96–112)
Creat: 0.82 mg/dL (ref 0.50–1.35)
GFR, Est African American: >60 mL/min (ref >60)
GFR, Est Non African American: >60 mL/min (ref >60)
Glucose, Bld: 100 mg/dL — ABNORMAL HIGH (ref 70–99)
Potassium: 4 mEq/L (ref 3.5–5.3)
Sodium: 140 mEq/L (ref 135–145)

## 2011-03-28 MED ORDER — FUROSEMIDE 20 MG PO TABS: 20.0000 mg | ORAL_TABLET | Freq: Two times a day (BID) | ORAL | Status: DC

## 2011-03-28 MED ORDER — FUROSEMIDE 40 MG PO TABS: 40.0000 mg | ORAL_TABLET | Freq: Every day | ORAL | Status: DC

## 2011-03-28 NOTE — Patient Instructions (Signed)
Please make followup appointment with Dr. Allena Katz in 10-14 days. Please stop taking all the tone from today and start taking Lasix 40 mg by mouth daily. Also take some more banana and drink orange juice until the next visit to keep your potassium level balanced in body as Lasix makes you pee out more potassium. Will check lab test next appointment and discuss medication.

## 2011-03-28 NOTE — Assessment & Plan Note (Signed)
Starting Aldactone on 03/19/2011 worked pretty well for Mr. Paluch is his blood pressure dropped 150/82. In any case, he probably needed a diuretic to control her blood pressure- it may be Lasix, HCTZ or Aldactone-which is the weakest diuretic of 3. Even though he reported using bathroom more frequently since he started taking Aldactone. Considering the fact that diuresis might have helped him more than renin angiotensin system effect of Aldactone- I will change Aldactone to Lasix 40 mg by mouth daily- considering it to be more effective diuretic and him having issues of impotence with HCTZ in past. I discussed this with Dr. Coralee Pesa and then discussed with Mr. Geisen and he agreed to plan. I will see him back in 10-14 days we'll check his blood pressure and BMP. If his blood pressure goes back high, I would start him back on Aldactone.

## 2011-03-28 NOTE — Progress Notes (Signed)
  Subjective:    Patient ID: Devon Vega, male    DOB: Sep 13, 1955, 55 y.o.   MRN: [id number]  HPI Devon Vega is a pleasant 55 woman with past with history of hypertension,  hyperlipidemia who comes the clinic for followup for hypertension and lab work. During last visit he was started on Aldactone due to 3 uncontrolled blood pressure readings. His blood pressure today is 150/82 and is in great control compared to previous tracings. He is feeling better and says that he uses bathroom more frequent after starting Aldactone. He denies any chest pain, short of breath abdominal pain, nausea vomiting, headache, diarrhea, and fever, chills.   Review of Systems    as per history of present illness, all other systems reviewed and negative. Objective:   Physical Exam Constitutional: Vital signs reviewed.  Patient is a well-developed and well-nourished in no acute distress and cooperative with exam. Alert and oriented x3.  Head: Normocephalic and atraumatic Mouth: no erythema or exudates, MMM Eyes: PERRL, EOMI, conjunctivae normal, No scleral icterus.  Neck: Supple, Trachea midline normal ROM Cardiovascular: RRR, S1 normal, S2 normal, no MRG, pulses symmetric and intact bilaterally Pulmonary/Chest: CTAB, no wheezes, rales, or rhonchi Abdominal: Soft. Non-tender, non-distended, bowel sounds are normal, no masses, organomegaly, or guarding present.  Musculoskeletal: No joint deformities, erythema, or stiffness, ROM full and no nontender Neurological: A&O x3, Strenght is normal and symmetric bilaterally, cranial nerve II-XII are grossly intact, no focal motor deficit, sensory intact to light touch bilaterally.  Skin: Warm, dry and intact. No rash, cyanosis, or clubbing.           Assessment & Plan:

## 2011-03-28 NOTE — Assessment & Plan Note (Signed)
Continue Crestor 

## 2011-04-11 ENCOUNTER — Encounter: Payer: Self-pay | Admitting: Internal Medicine

## 2011-04-11 ENCOUNTER — Ambulatory Visit (INDEPENDENT_AMBULATORY_CARE_PROVIDER_SITE_OTHER): Payer: 59 | Admitting: Internal Medicine

## 2011-04-11 VITALS — BP 148/81 | HR 53 | Temp 98.5°F | Ht 65.0 in | Wt 158.2 lb

## 2011-04-11 DIAGNOSIS — E785 Hyperlipidemia, unspecified: Secondary | ICD-10-CM

## 2011-04-11 DIAGNOSIS — I10 Essential (primary) hypertension: Secondary | ICD-10-CM

## 2011-04-11 LAB — BASIC METABOLIC PANEL WITH GFR
BUN: 11 mg/dL (ref 6–23)
CO2: 30 mEq/L (ref 19–32)
Calcium: 9.7 mg/dL (ref 8.4–10.5)
Chloride: 104 mEq/L (ref 96–112)
Creat: 0.95 mg/dL (ref 0.50–1.35)
GFR, Est African American: >60 mL/min (ref >60)
GFR, Est Non African American: >60 mL/min (ref >60)
Glucose, Bld: 121 mg/dL — ABNORMAL HIGH (ref 70–99)
Potassium: 4.1 mEq/L (ref 3.5–5.3)
Sodium: 140 mEq/L (ref 135–145)

## 2011-04-11 NOTE — Assessment & Plan Note (Signed)
Continue Crestor 

## 2011-04-11 NOTE — Assessment & Plan Note (Addendum)
Blood pressure beautifully responded to diuretics. Spironolactone stopped during last visit and Lasix started. Systolic Blood pressure mildly elevated today, but is much better than before and so I will not make any changes today. I will continue him on Lasix, Coreg and Norvasc. I will check his BMP today to check electrolyte levels and effect on creatinine. Also advised him to eat few bananas everyday. I will see him back in 2 to 3 months.

## 2011-04-11 NOTE — Patient Instructions (Signed)
Please make followup appointment in 2-3 months. Continue taking Lasix 40 mg daily.  Eat one or 2 bananas daily.  I will call you if anything needs to be changed after the blood test. If I don't call you then everything is okay. Meanwhile if you need an early appointment call the clinic. Also if you want to get a flu shot, just come to the clinic and you can get the shot. You don't have to see a Dr. for that.

## 2011-04-11 NOTE — Progress Notes (Signed)
  Subjective:    Patient ID: Devon Vega, male    DOB: 08-09-1955, 55 y.o.   MRN: [id number]  HPI Devon Vega is a pleasant 35 year man with past with history of hypertension, cervical DDD, hyperlipidemia who comes to the clinic for followup visit for hypertension.  I saw him 2 times this month on 03/19/2011 to 03/28/2011. His blood pressure was severely elevated on previous visits- so I started him on spironolactone to which he beautifully responded and pressure dropped to 150 over 80s. This was considered to be effect of diuresis more than RAS effect and so we decided to change his diuretic to Lasix during last visit. So I stopped his spironolactone 03/28/2011 and started him on Lasix 40 mg daily. Today his blood pressure is 148/81 and is under much better control than before. He denies any symptoms as usual. Denies any fever, chills, nausea vomiting, abdominal pain, chest pain, short of breath.     Review of Systems     as per history of present illness, all other systems reviewed and negative.  Objective:   Physical Exam Constitutional: Vital signs reviewed.  Patient is a well-developed and well-nourished in no acute distress and cooperative with exam. Alert and oriented x3.  Head: Normocephalic and atraumatic Mouth: no erythema or exudates, MMM Eyes: PERRL, EOMI, conjunctivae normal, No scleral icterus.  Neck: Supple, Trachea midline normal ROM, No JVD Cardiovascular: RRR, S1 normal, S2 normal, no MRG, pulses symmetric and intact bilaterally Pulmonary/Chest: CTAB, no wheezes, rales, or rhonchi Abdominal: Soft. Non-tender, non-distended, bowel sounds are normal Musculoskeletal: No joint deformities, erythema, or stiffness, ROM full and no nontender Neurological: A&O x3, Strenght is normal and symmetric bilaterally, cranial nerve II-XII are grossly intact, no focal motor deficit, sensory intact to light touch bilaterally.  Skin: Warm, dry and intact. No rash, cyanosis, or  clubbing.         Assessment & Plan:

## 2011-04-15 ENCOUNTER — Other Ambulatory Visit: Payer: Self-pay | Admitting: Internal Medicine

## 2011-04-15 ENCOUNTER — Telehealth: Payer: Self-pay | Admitting: *Deleted

## 2011-04-15 DIAGNOSIS — M549 Dorsalgia, unspecified: Secondary | ICD-10-CM

## 2011-04-15 MED ORDER — HYDROCODONE-ACETAMINOPHEN 5-500 MG PO TABS: 1.0000 | ORAL_TABLET | Freq: Every day | ORAL | Status: DC | PRN

## 2011-04-15 NOTE — Telephone Encounter (Signed)
Hydrocodone-ACET 5-500mg  rxs x 3 faxed to [facility] pharmacy per Dr. Allena Katz. Do not refill before 10/5, 11/4, and 12/3.

## 2011-05-26 ENCOUNTER — Other Ambulatory Visit: Payer: Self-pay | Admitting: Internal Medicine

## 2011-05-29 NOTE — Telephone Encounter (Signed)
Refill for Vicodin 5/500 # 30 called to the [facility] per order of Dr. Allena Katz.

## 2011-07-10 ENCOUNTER — Other Ambulatory Visit: Payer: Self-pay | Admitting: Internal Medicine

## 2011-07-10 NOTE — Telephone Encounter (Signed)
Vicodin rx faxed to [facility] pharmacy.

## 2011-08-13 ENCOUNTER — Other Ambulatory Visit: Payer: Self-pay | Admitting: Internal Medicine

## 2011-08-14 ENCOUNTER — Other Ambulatory Visit: Payer: Self-pay | Admitting: *Deleted

## 2011-08-14 NOTE — Telephone Encounter (Signed)
Vicodin 5/500mg  rx faxed to [facility].

## 2011-08-15 NOTE — Telephone Encounter (Signed)
Called to pharm 

## 2011-10-16 ENCOUNTER — Other Ambulatory Visit: Payer: Self-pay | Admitting: Internal Medicine

## 2011-10-23 ENCOUNTER — Telehealth: Payer: Self-pay | Admitting: *Deleted

## 2011-10-23 NOTE — Telephone Encounter (Signed)
Rx generic Vicodin called to [facility]. Pt aware. Dr Allena Katz had signed written Rx for same medication - written Rx destroyed.  Stanton Kidney Aalliyah Kilker RN 10/23/11 1:50PM

## 2011-10-29 ENCOUNTER — Ambulatory Visit (INDEPENDENT_AMBULATORY_CARE_PROVIDER_SITE_OTHER): Payer: 59 | Admitting: Family Medicine

## 2011-10-29 VITALS — BP 155/89 | HR 72 | Temp 98.2°F | Resp 16 | Ht 65.0 in | Wt 168.0 lb

## 2011-10-29 DIAGNOSIS — J309 Allergic rhinitis, unspecified: Secondary | ICD-10-CM

## 2011-10-29 DIAGNOSIS — I10 Essential (primary) hypertension: Secondary | ICD-10-CM

## 2011-10-29 DIAGNOSIS — R05 Cough: Secondary | ICD-10-CM

## 2011-10-29 DIAGNOSIS — R059 Cough, unspecified: Secondary | ICD-10-CM

## 2011-10-29 MED ORDER — HYDROCODONE-HOMATROPINE 5-1.5 MG/5ML PO SYRP: 5.0000 mL | ORAL_SOLUTION | Freq: Every evening | ORAL | Status: AC | PRN

## 2011-10-29 NOTE — Progress Notes (Signed)
[facility]:  Office Visit  Chief Complaint:  Chief Complaint  Patient presents with  . Sore Throat    x 4 days  . Cough    x 4 days    HPI: Devon Vega is a 56 y.o. male who complains of  Sore throat x 4 days, odynophagia with food and liquids, smoker ( 1/2 ppd x 40). White sputum, productive cough. Denies fevers, chills, sinus pain, or ear pain. Took Robitussin cough syrup with minimal releif. Has a h/o allergies but does not take his Nasonex.  Past Medical History  Diagnosis Date  . Alcohol abuse     hx of , sober since 1997  . Tobacco abuse     failed zyban  . 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 Weight gain   . Hyperlipidemia   . Hypertension    History reviewed. No pertinent past surgical history. History   Social History  . Marital Status: Single    Spouse Name: N/A    Number of Children: N/A  . Years of Education: N/A   Social History Main Topics  . Smoking status: Current Everyday Smoker -- 0.5 packs/day    Types: Cigarettes  . Smokeless tobacco: None  . Alcohol Use: Yes     1 -2 beers on the w/e  . Drug Use: No  . Sexually Active: None   Other Topics Concern  . None   Social History Narrative   Works for [employer]. As an Heritage manager (primarily a Librarian, academic smoker- 1ppdx40 yrsAlcohol use- 6 pk beer over a weekendRegular exercise- walks about 30 miny=utes a dayDivorced, married 17 yrs- no children   Family History  Problem Relation Age of Onset  . Cancer Father    Allergies  Allergen Reactions  . Codeine     REACTION: Unknown reaction  . Hydrochlorothiazide     REACTION: impotence  . Lisinopril     REACTION: cough   Prior to Admission medications   Medication Sig Start Date End Date Taking? Authorizing Provider  amLODipine (NORVASC) 10 MG tablet Take 1 tablet (10 mg total) by mouth daily. 03/19/11  Yes Sunday Spillers, MD  carvedilol (COREG) 12.5 MG tablet Take 1 tablet (12.5 mg total) by mouth  2 (two) times daily with a meal. 03/19/11  Yes Sunday Spillers, MD  furosemide (LASIX) 40 MG tablet Take 1 tablet (40 mg total) by mouth daily. 03/28/11 03/27/12 Yes Sunday Spillers, MD  gabapentin (NEURONTIN) 300 MG capsule Take 1 capsule (300 mg total) by mouth daily. 03/19/11  Yes Sunday Spillers, MD  guaifenesin (ROBITUSSIN) 100 MG/5ML syrup Take 100 mg by mouth 3 (three) times daily as needed.     Yes Historical Provider, MD  HYDROcodone-acetaminophen (VICODIN) 5-500 MG per tablet take 1 tablet by mouth once daily if needed for pain 10/16/11  Yes Sunday Spillers, MD  mometasone (NASONEX) 50 MCG/ACT nasal spray Place 2 sprays into the nose daily. As needed for allergy symptoms    Yes Historical Provider, MD  rosuvastatin (CRESTOR) 10 MG tablet Take 1 tablet (10 mg total) by mouth daily. 03/19/11  Yes Sunday Spillers, MD  meloxicam (MOBIC) 7.5 MG tablet  09/28/10   Historical Provider, MD     ROS: The patient denies fevers, chills, night sweats, unintentional weight loss, chest pain, palpitations, wheezing, dyspnea on exertion, nausea, vomiting, abdominal pain, dysuria, hematuria, melena, numbness, weakness, or tingling.   All other  systems have been reviewed and were otherwise negative with the exception of those mentioned in the HPI and as above.    PHYSICAL EXAM: Filed Vitals:   10/29/11 1543  BP: 155/89  Pulse: 72  Temp: 98.2 F (36.8 C)  Resp: 16   Filed Vitals:   10/29/11 1543  Height: 5\' 5"  (1.651 m)  Weight: 168 lb (76.204 kg)   Body mass index is 27.96 kg/(m^2).  General: Alert, no acute distress HEENT:  Normocephalic, atraumatic, oropharynx patent. PND, no exudates, erythematous throat, tonsils nl, TM nl, no sinus tenderness Cardiovascular:  Regular rate and rhythm, no rubs murmurs or gallops.  No Carotid bruits, radial pulse intact. No pedal edema.  Respiratory: Clear to auscultation bilaterally.  No wheezes, rales, or rhonchi.  No cyanosis, no use of accessory musculature GI: No organomegaly,  abdomen is soft and non-tender, positive bowel sounds.  No masses. Skin: No rashes. Neurologic: Facial musculature symmetric. Psychiatric: Patient is appropriate throughout our interaction. Lymphatic: No cervical lymphadenopathy Musculoskeletal: Gait intact.   LABS:    EKG/XRAY:   Primary read interpreted by Dr. Conley Rolls at [facility].   ASSESSMENT/PLAN: Encounter Diagnoses  Name Primary?  . Cough Yes  . Allergic rhinitis    Patient decline Tessalon Perles. Wants only Hycodan. Cough most likeley related to smoking and also allergies. He does not appear to have bronchitis based on exam and sxs Advise to do nasal flushes and use Nasonex and also OTC antihistamine without the D.     Crystalmarie Yasin PHUONG, DO 10/29/2011 4:13 PM

## 2011-11-23 ENCOUNTER — Other Ambulatory Visit: Payer: Self-pay | Admitting: Internal Medicine

## 2011-11-23 DIAGNOSIS — IMO0002 Reserved for concepts with insufficient information to code with codable children: Secondary | ICD-10-CM

## 2011-11-27 NOTE — Telephone Encounter (Signed)
vicodin 5/500 # 30 tabs, not 5/325

## 2011-11-27 NOTE — Telephone Encounter (Signed)
Written script given for Vicodin 5/325- 30 tabs with 0 refills.

## 2012-01-12 ENCOUNTER — Other Ambulatory Visit: Payer: Self-pay | Admitting: *Deleted

## 2012-01-12 DIAGNOSIS — IMO0002 Reserved for concepts with insufficient information to code with codable children: Secondary | ICD-10-CM

## 2012-01-12 NOTE — Telephone Encounter (Signed)
Last refill per pharmacy 5/16 # 30

## 2012-01-19 MED ORDER — HYDROCODONE-ACETAMINOPHEN 5-500 MG PO TABS: 1.0000 | ORAL_TABLET | Freq: Every day | ORAL | Status: DC | PRN

## 2012-01-19 NOTE — Telephone Encounter (Signed)
Refill given to [facility]. Thanks.

## 2012-02-23 ENCOUNTER — Other Ambulatory Visit: Payer: Self-pay | Admitting: Internal Medicine

## 2012-02-25 NOTE — Telephone Encounter (Signed)
Called to pharm 

## 2012-03-31 ENCOUNTER — Other Ambulatory Visit: Payer: Self-pay | Admitting: Internal Medicine

## 2012-04-01 ENCOUNTER — Other Ambulatory Visit: Payer: Self-pay | Admitting: Internal Medicine

## 2012-04-02 NOTE — Telephone Encounter (Signed)
Rx called in to pharmacy. 

## 2012-05-09 ENCOUNTER — Other Ambulatory Visit: Payer: Self-pay | Admitting: Internal Medicine

## 2012-05-17 NOTE — Telephone Encounter (Signed)
Medication called into pharmacy with no refills.  Pt needs to make appointment to re-eval pain and sign pain contract.Devon Spittle Cassady11/4/20134:41 PM

## 2012-05-28 ENCOUNTER — Encounter: Payer: Self-pay | Admitting: Internal Medicine

## 2012-05-28 ENCOUNTER — Ambulatory Visit (INDEPENDENT_AMBULATORY_CARE_PROVIDER_SITE_OTHER): Payer: 59 | Admitting: Internal Medicine

## 2012-05-28 VITALS — BP 153/75 | HR 70 | Temp 97.9°F | Resp 20 | Ht 65.5 in | Wt 162.6 lb

## 2012-05-28 DIAGNOSIS — F17200 Nicotine dependence, unspecified, uncomplicated: Secondary | ICD-10-CM

## 2012-05-28 DIAGNOSIS — IMO0002 Reserved for concepts with insufficient information to code with codable children: Secondary | ICD-10-CM

## 2012-05-28 DIAGNOSIS — E785 Hyperlipidemia, unspecified: Secondary | ICD-10-CM

## 2012-05-28 DIAGNOSIS — M5030 Other cervical disc degeneration, unspecified cervical region: Secondary | ICD-10-CM

## 2012-05-28 DIAGNOSIS — I10 Essential (primary) hypertension: Secondary | ICD-10-CM

## 2012-05-28 NOTE — Patient Instructions (Addendum)
Please make followup appointment in 6-8 months or earlier if needed.  I will call you if the lab report shows any abnormalities. We have signed the pain contract today for Vicodin. Continue taking Coreg and Norvasc for blood pressure and Crestor for cholesterol.

## 2012-05-28 NOTE — Assessment & Plan Note (Signed)
Lab Results  Component Value Date   NA 140 04/11/2011   K 4.1 04/11/2011   CL 104 04/11/2011   CO2 30 04/11/2011   BUN 11 04/11/2011   CREATININE 0.95 04/11/2011   CREATININE 1.04 04/12/2010    BP Readings from Last 3 Encounters:  05/28/12 153/75  10/29/11 155/89  04/11/11 148/81    Assessment: Hypertension control:  mildly elevated  Progress toward goals:  unchanged Barriers to meeting goals:  no barriers identified  Plan: Hypertension treatment:  continue current medications. Continue Norvasc, Coreg.

## 2012-05-28 NOTE — Assessment & Plan Note (Addendum)
Was followed by Dr. Regino Schultze with orthopedics- he saw him a year before. He had a steroid injections about 2 years before. Chronic stable disease.  Well-controlled one Vicodin tablet as needed daily. Although he is not taking Neurontin anymore as it makes him sleepy. Will change prescription to Vicodin 5-325- 30 tablets every month. Will sign a pain contract today and get UDS. He watched the pain contract video.

## 2012-05-28 NOTE — Progress Notes (Signed)
  Subjective:    Patient ID: Devon Vega, male    DOB: [birth year], 56 y.o.   MRN: [id number]  HPI patient is a pleasant 56 year old man with hypertension, hyperlipidemia, chronic neck pain from cervical DDD, the clinic for yearly visit and signing pain contract. I saw him last in September 2012 and his blood pressure was getting better at that time. He reports his pain has been under good control with one Vicodin everyday. He still reports intermittent worsening pain of cervical spine. He saw Dr. Regino Schultze year before and his last steroid injection was about 2 years before. He's not taking Neurontin anymore as it makes him sleepy. Today his blood pressure is 153/75 and he says that he smoked a cigar just before coming to clinic, which he does not smoke frequently, and might have elevated his blood pressure.  He denies any new major problems for last one year since I saw him. He denies any fever, chills, nausea vomiting, abdominal pain, chest pain, short of breath, diarrhea or urinary abnormalities. He denies using illicit drugs.     Review of Systems     as per history of present illness.  Objective:   Physical Exam  General: NAD HEENT: PERRL, EOMI, no scleral icterus Cardiac: S1, S2, RRR, no rubs, murmurs or gallops Pulm: clear to auscultation bilaterally, moving normal volumes of air Abd: soft, nontender, nondistended, BS present Ext: warm and well perfused, no pedal edema Neuro: alert and oriented X3, cranial nerves II-XII grossly intact       Assessment & Plan:

## 2012-05-28 NOTE — Assessment & Plan Note (Signed)
Will not check cholesterol today. Continue Crestor.

## 2012-05-28 NOTE — Assessment & Plan Note (Signed)
Still smoking. Encouraged to quit.

## 2012-05-29 LAB — PRESCRIPTION ABUSE MONITORING 15P, URINE
Amphetamine/Meth: NEGATIVE ng/mL
Barbiturate Screen, Urine: NEGATIVE ng/mL
Benzodiazepine Screen, Urine: NEGATIVE ng/mL
Buprenorphine, Urine: NEGATIVE ng/mL
Cannabinoid Scrn, Ur: NEGATIVE ng/mL
Carisoprodol, Urine: NEGATIVE ng/mL
Cocaine Metabolites: NEGATIVE ng/mL
Creatinine, Urine: 100.54 mg/dL (ref >20.0)
Fentanyl, Ur: NEGATIVE ng/mL
Meperidine, Ur: NEGATIVE ng/mL
Methadone Screen, Urine: NEGATIVE ng/mL
Oxycodone Screen, Ur: NEGATIVE ng/mL
Propoxyphene: NEGATIVE ng/mL
Tramadol Scrn, Ur: NEGATIVE ng/mL
Zolpidem, Urine: NEGATIVE ng/mL

## 2012-06-01 LAB — OPIATES/OPIOIDS (LC/MS-MS)
Codeine Urine: NEGATIVE ng/mL
Heroin (6-AM), UR: NEGATIVE ng/mL
Hydrocodone: 50 ng/mL
Hydromorphone: 66 ng/mL
Morphine Urine: NEGATIVE ng/mL
Norhydrocodone, Ur: 65 ng/mL
Noroxycodone, Ur: NEGATIVE ng/mL
Oxycodone, ur: NEGATIVE ng/mL
Oxymorphone: NEGATIVE ng/mL

## 2012-06-16 ENCOUNTER — Other Ambulatory Visit: Payer: Self-pay | Admitting: Internal Medicine

## 2012-06-17 NOTE — Telephone Encounter (Signed)
Vicodin called to [facility].

## 2012-07-19 ENCOUNTER — Other Ambulatory Visit: Payer: Self-pay | Admitting: *Deleted

## 2012-07-19 MED ORDER — HYDROCODONE-ACETAMINOPHEN 5-325 MG PO TABS: 1.0000 | ORAL_TABLET | Freq: Every day | ORAL | Status: DC | PRN

## 2012-07-19 NOTE — Telephone Encounter (Signed)
Rx called in to pharmacy. 

## 2012-09-03 ENCOUNTER — Other Ambulatory Visit: Payer: Self-pay | Admitting: Internal Medicine

## 2012-12-01 ENCOUNTER — Ambulatory Visit (INDEPENDENT_AMBULATORY_CARE_PROVIDER_SITE_OTHER): Payer: 59 | Admitting: Internal Medicine

## 2012-12-01 ENCOUNTER — Encounter: Payer: Self-pay | Admitting: Internal Medicine

## 2012-12-01 VITALS — BP 120/80 | HR 64 | Temp 98.6°F | Ht 65.5 in | Wt 161.1 lb

## 2012-12-01 DIAGNOSIS — D1730 Benign lipomatous neoplasm of skin and subcutaneous tissue of unspecified sites: Secondary | ICD-10-CM

## 2012-12-01 DIAGNOSIS — D219 Benign neoplasm of connective and other soft tissue, unspecified: Secondary | ICD-10-CM

## 2012-12-01 DIAGNOSIS — I10 Essential (primary) hypertension: Secondary | ICD-10-CM

## 2012-12-01 DIAGNOSIS — D1739 Benign lipomatous neoplasm of skin and subcutaneous tissue of other sites: Secondary | ICD-10-CM

## 2012-12-01 HISTORY — DX: Benign neoplasm of connective and other soft tissue, unspecified: D21.9

## 2012-12-01 NOTE — Assessment & Plan Note (Signed)
Fourth finger dorsal middle phalanx.  Dermatology referral for removal.

## 2012-12-01 NOTE — Assessment & Plan Note (Signed)
BP Readings from Last 3 Encounters:  12/01/12 120/80  05/28/12 153/75  10/29/11 155/89    Lab Results  Component Value Date   NA 140 04/11/2011   K 4.1 04/11/2011   CREATININE 0.95 04/11/2011    Assessment: Blood pressure control:   at goal Progress toward BP goal:    improved Comments:   Plan: Medications:  continue current medications, Coreg and amlodipine. Educational resources provided:   Self management tools provided:   Other plans: No changes in medications.

## 2012-12-01 NOTE — Progress Notes (Signed)
  Subjective:    Patient ID: Devon Vega, male    DOB: Jan 23, 1956, 57 y.o.   MRN: [id number]  HPI patient is a pleasant 57 year old man with history of cervical disc disease, hypertension, hyperlipidemia and other problems as per problem list who comes the clinic for regular followup visit.  He denies any new complaints or problems. Denies any fever, chills, nausea vomiting, vomiting, chest pain, short of breath, diarrhea, headache, palpitations.  Still has the neck pain which is well-controlled but on Vicodin daily. Hasn't seen his orthopedic surgeon for about a year. No steroid needed for more than a year now. Is been able to work properly with one Vicodin daily without any worsening pain or any side effects.  He has a cyst on his fourth right finger on second phalanx- lipoma- which he would like to get removed for cosmetic reasons. Does not have any symptoms of pain, bleeding or infection.  Review of Systems    as per history of present illness. Objective:   Physical Exam  General: resting in bed HEENT: PERRL, EOMI, no scleral icterus Cardiac: RRR, no rubs, murmurs or gallops Pulm: clear to auscultation bilaterally, moving normal volumes of air Abd: soft, nontender, nondistended, BS present Ext: warm and well perfused, no pedal edema Skin: No rashes. Right fourth finger lipoma on the dorsal aspect of middle phalanx fourth finger. Nontender, no redness or discharge. Neuro: alert and oriented X3, cranial nerves II-XII grossly intact       Assessment & Plan:

## 2012-12-01 NOTE — Patient Instructions (Signed)
Please make a followup appointment in 8-12 months.  Continue taking medications regularly as you do.  Call earlier if needed for any concerns or questions.

## 2012-12-01 NOTE — Assessment & Plan Note (Signed)
Stable cervical disc disease. Stable with one Vicodin daily.  Continue Vicodin. Intermittent followup with orthopedics if needed for flareups.

## 2013-01-17 ENCOUNTER — Other Ambulatory Visit: Payer: Self-pay | Admitting: Internal Medicine

## 2013-01-18 ENCOUNTER — Other Ambulatory Visit: Payer: Self-pay | Admitting: Orthopedic Surgery

## 2013-01-18 NOTE — Telephone Encounter (Signed)
Review of Narcotic database reveals compliance with pain contract.  Will prescribe with 2 refills until he is able to see his new PCP Dr. Aundria Rud.

## 2013-01-19 ENCOUNTER — Encounter (HOSPITAL_BASED_OUTPATIENT_CLINIC_OR_DEPARTMENT_OTHER): Payer: Self-pay | Admitting: *Deleted

## 2013-01-19 NOTE — Progress Notes (Signed)
To come in for bmet-ekg-goes to [facility]

## 2013-01-20 ENCOUNTER — Encounter (HOSPITAL_BASED_OUTPATIENT_CLINIC_OR_DEPARTMENT_OTHER)
Admission: RE | Admit: 2013-01-20 | Discharge: 2013-01-20 | Disposition: A | Discharge status: Home / Self Care | Payer: 59 | Source: Ambulatory Visit | Attending: Orthopedic Surgery | Admitting: Orthopedic Surgery

## 2013-01-20 LAB — BASIC METABOLIC PANEL
BUN: 9 mg/dL (ref 6–23)
CO2: 30 mEq/L (ref 19–32)
Calcium: 9.5 mg/dL (ref 8.4–10.5)
Chloride: 102 mEq/L (ref 96–112)
Creatinine, Ser: 0.93 mg/dL (ref 0.50–1.35)
GFR calc Af Amer: >90 mL/min (ref >90)
GFR calc non Af Amer: >90 mL/min (ref >90)
Glucose, Bld: 93 mg/dL (ref 70–99)
Potassium: 4 mEq/L (ref 3.5–5.1)
Sodium: 138 mEq/L (ref 135–145)

## 2013-01-20 MED ORDER — CHLORHEXIDINE GLUCONATE 4 % EX LIQD: 60.0000 mL | Freq: Once | CUTANEOUS | Status: DC

## 2013-01-21 ENCOUNTER — Ambulatory Visit (HOSPITAL_BASED_OUTPATIENT_CLINIC_OR_DEPARTMENT_OTHER)
Admission: RE | Admit: 2013-01-21 | Discharge: 2013-01-21 | Disposition: A | Discharge status: Home / Self Care | Payer: 59 | Source: Ambulatory Visit | Attending: Orthopedic Surgery | Admitting: Orthopedic Surgery

## 2013-01-21 ENCOUNTER — Encounter (HOSPITAL_BASED_OUTPATIENT_CLINIC_OR_DEPARTMENT_OTHER): Payer: Self-pay | Admitting: Anesthesiology

## 2013-01-21 ENCOUNTER — Ambulatory Visit (HOSPITAL_BASED_OUTPATIENT_CLINIC_OR_DEPARTMENT_OTHER): Payer: 59 | Admitting: Anesthesiology

## 2013-01-21 ENCOUNTER — Encounter (HOSPITAL_BASED_OUTPATIENT_CLINIC_OR_DEPARTMENT_OTHER)
Admission: RE | Disposition: A | Discharge status: Home / Self Care | Payer: Self-pay | Source: Ambulatory Visit | Attending: Orthopedic Surgery

## 2013-01-21 ENCOUNTER — Encounter (HOSPITAL_BASED_OUTPATIENT_CLINIC_OR_DEPARTMENT_OTHER): Payer: Self-pay | Admitting: *Deleted

## 2013-01-21 DIAGNOSIS — D2360 Other benign neoplasm of skin of unspecified upper limb, including shoulder: Secondary | ICD-10-CM | POA: Insufficient documentation

## 2013-01-21 DIAGNOSIS — I10 Essential (primary) hypertension: Secondary | ICD-10-CM | POA: Insufficient documentation

## 2013-01-21 DIAGNOSIS — F17200 Nicotine dependence, unspecified, uncomplicated: Secondary | ICD-10-CM | POA: Insufficient documentation

## 2013-01-21 DIAGNOSIS — E785 Hyperlipidemia, unspecified: Secondary | ICD-10-CM | POA: Insufficient documentation

## 2013-01-21 HISTORY — PX: MASS EXCISION: SHX2000

## 2013-01-21 LAB — POCT HEMOGLOBIN-HEMACUE: Hemoglobin: 15.8 g/dL (ref 13.0–17.0)

## 2013-01-21 SURGERY — EXCISION MASS
Anesthesia: Monitor Anesthesia Care | Site: Finger | Laterality: Right | Wound class: Clean

## 2013-01-21 MED ORDER — HYDROMORPHONE HCL 2 MG PO TABS: 2.0000 mg | ORAL_TABLET | ORAL | Status: DC | PRN

## 2013-01-21 MED ORDER — LIDOCAINE HCL 2 % IJ SOLN
INTRAMUSCULAR | Status: DC | PRN
  Administered 2013-01-21: 6 mL

## 2013-01-21 MED ORDER — DEXAMETHASONE SODIUM PHOSPHATE 4 MG/ML IJ SOLN
INTRAMUSCULAR | Status: DC | PRN
  Administered 2013-01-21: 10 mg via INTRAVENOUS

## 2013-01-21 MED ORDER — MIDAZOLAM HCL 5 MG/5ML IJ SOLN
INTRAMUSCULAR | Status: DC | PRN
  Administered 2013-01-21: 2 mg via INTRAVENOUS

## 2013-01-21 MED ORDER — LACTATED RINGERS IV SOLN
INTRAVENOUS | Status: DC
  Administered 2013-01-21: 11:00:00 via INTRAVENOUS

## 2013-01-21 MED ORDER — PROPOFOL INFUSION 10 MG/ML OPTIME
INTRAVENOUS | Status: DC | PRN
  Administered 2013-01-21: 100 ug/kg/min via INTRAVENOUS

## 2013-01-21 MED ORDER — FENTANYL CITRATE 0.05 MG/ML IJ SOLN
INTRAMUSCULAR | Status: DC | PRN
  Administered 2013-01-21 (×2): 100 ug via INTRAVENOUS

## 2013-01-21 MED ORDER — FENTANYL CITRATE 0.05 MG/ML IJ SOLN: 25.0000 ug | INTRAMUSCULAR | Status: DC | PRN

## 2013-01-21 SURGICAL SUPPLY — 48 items
BANDAGE ADHESIVE 1X3 (GAUZE/BANDAGES/DRESSINGS) IMPLANT
BANDAGE ELASTIC 3 VELCRO ST LF (GAUZE/BANDAGES/DRESSINGS) IMPLANT
BANDAGE GAUZE ELAST BULKY 4 IN (GAUZE/BANDAGES/DRESSINGS) IMPLANT
BLADE MINI RND TIP GREEN BEAV (BLADE) IMPLANT
BLADE SURG 15 STRL LF DISP TIS (BLADE) ×1 IMPLANT
BLADE SURG 15 STRL SS (BLADE) ×2
BNDG CMPR 9X4 STRL LF SNTH (GAUZE/BANDAGES/DRESSINGS) ×1
BNDG CMPR MD 5X2 ELC HKLP STRL (GAUZE/BANDAGES/DRESSINGS)
BNDG COHESIVE 1X5 TAN STRL LF (GAUZE/BANDAGES/DRESSINGS) IMPLANT
BNDG ELASTIC 2 VLCR STRL LF (GAUZE/BANDAGES/DRESSINGS) IMPLANT
BNDG ESMARK 4X9 LF (GAUZE/BANDAGES/DRESSINGS) ×1 IMPLANT
BRUSH SCRUB EZ PLAIN DRY (MISCELLANEOUS) ×2 IMPLANT
CLOTH BEACON ORANGE TIMEOUT ST (SAFETY) ×2 IMPLANT
CORDS BIPOLAR (ELECTRODE) ×1 IMPLANT
COVER MAYO STAND STRL (DRAPES) ×2 IMPLANT
COVER TABLE BACK 60X90 (DRAPES) ×2 IMPLANT
CUFF TOURNIQUET SINGLE 18IN (TOURNIQUET CUFF) IMPLANT
DECANTER SPIKE VIAL GLASS SM (MISCELLANEOUS) IMPLANT
DRAIN PENROSE 1/2X12 LTX STRL (WOUND CARE) IMPLANT
DRAIN PENROSE 1/4X12 LTX STRL (WOUND CARE) IMPLANT
DRAPE EXTREMITY T 121X128X90 (DRAPE) ×2 IMPLANT
DRAPE SURG 17X23 STRL (DRAPES) ×2 IMPLANT
GAUZE XEROFORM 1X8 LF (GAUZE/BANDAGES/DRESSINGS) IMPLANT
GLOVE BIOGEL M STRL SZ7.5 (GLOVE) ×2 IMPLANT
GLOVE ORTHO TXT STRL SZ7.5 (GLOVE) ×2 IMPLANT
GOWN BRE IMP PREV XXLGXLNG (GOWN DISPOSABLE) ×3 IMPLANT
GOWN PREVENTION PLUS XLARGE (GOWN DISPOSABLE) ×2 IMPLANT
NDL BLUNT 17GA (NEEDLE) IMPLANT
NEEDLE 27GAX1X1/2 (NEEDLE) ×1 IMPLANT
NEEDLE BLUNT 17GA (NEEDLE) IMPLANT
NS IRRIG 1000ML POUR BTL (IV SOLUTION) IMPLANT
PACK BASIN DAY SURGERY FS (CUSTOM PROCEDURE TRAY) ×2 IMPLANT
PADDING CAST ABS 4INX4YD NS (CAST SUPPLIES) ×1
PADDING CAST ABS COTTON 4X4 ST (CAST SUPPLIES) ×1 IMPLANT
PADDING UNDERCAST 2  STERILE (CAST SUPPLIES) IMPLANT
SPONGE GAUZE 4X4 12PLY (GAUZE/BANDAGES/DRESSINGS) IMPLANT
STOCKINETTE 4X48 STRL (DRAPES) ×2 IMPLANT
STRIP CLOSURE SKIN 1/2X4 (GAUZE/BANDAGES/DRESSINGS) IMPLANT
SUT ETHILON 5 0 P 3 18 (SUTURE)
SUT MERSILENE 4 0 P 3 (SUTURE) IMPLANT
SUT NYLON ETHILON 5-0 P-3 1X18 (SUTURE) IMPLANT
SUT PROLENE 3 0 PS 2 (SUTURE) IMPLANT
SYR 20CC LL (SYRINGE) IMPLANT
SYR 3ML 23GX1 SAFETY (SYRINGE) IMPLANT
SYR CONTROL 10ML LL (SYRINGE) ×1 IMPLANT
TOWEL OR 17X24 6PK STRL BLUE (TOWEL DISPOSABLE) ×4 IMPLANT
TRAY DSU PREP LF (CUSTOM PROCEDURE TRAY) ×2 IMPLANT
UNDERPAD 30X30 INCONTINENT (UNDERPADS AND DIAPERS) ×2 IMPLANT

## 2013-01-21 NOTE — H&P (Signed)
Devon Vega is an 57 y.o. male.   Chief Complaint: c/o mass dorsum right ring finger HPI: .  Devon Vega is a 57 year-old gentleman who is employed by [employer].  He presented for evaluation of a firm mass on the dorsal aspect of his right ring finger overlying the middle phalangeal segment.  He was advised to undergo an incisional biopsy.  This was accomplished under local anesthesia at your office on 12/31/12.  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  The slides were reviewed by Dr. 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  The mass had been present for two years.  He is now referred for an upper extremity orthopaedic consult.    Past Medical History  Diagnosis Date  . Alcohol abuse     hx of , sober since 1997  . Tobacco abuse     failed zyban  . 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 Weight gain   . Hyperlipidemia   . Hypertension     Past Surgical History  Procedure Laterality Date  . Lymph node biopsy  2011    lt axilla-negative  . Colonoscopy      Family History  Problem Relation Age of Onset  . Cancer Father    Social History:  reports that he has been smoking Cigarettes and Cigars.  He has been smoking about 0.50 packs per day. He does not have any smokeless tobacco history on file. He reports that  drinks alcohol. He reports that he does not use illicit drugs.  Allergies:  Allergies  Allergen Reactions  . Codeine     REACTION: Unknown reaction  . Hydrochlorothiazide     REACTION: impotence  . Lisinopril     REACTION: cough    No prescriptions prior to admission    Results for orders placed during the hospital encounter of 01/21/13 (from the past 48 hour(s))  BASIC METABOLIC PANEL     Status: None   Collection Time    01/20/13  2:58 PM      Result Value Range   Sodium 138  135 - 145 mEq/L   Potassium 4.0  3.5 - 5.1 mEq/L   Chloride 102  96 -  112 mEq/L   CO2 30  19 - 32 mEq/L   Glucose, Bld 93  70 - 99 mg/dL   BUN 9  6 - 23 mg/dL   Creatinine, Ser 1.61  0.50 - 1.35 mg/dL   Calcium 9.5  8.4 - 09.6 mg/dL   GFR calc non Af Amer >90  >90 mL/min   GFR calc Af Amer >90  >90 mL/min   Comment:            The eGFR has been calculated     using the CKD EPI equation.     This calculation has not been     validated in all clinical     situations.     eGFR's persistently     <90 mL/min signify     possible Chronic Kidney Disease.    No results found.   Pertinent items are noted in HPI.  Height 5' 5.5" (1.664 m), weight 72.576 kg (160 lb).  General appearance: alert Head: Normocephalic, without obvious abnormality Neck: supple, symmetrical, trachea midline Resp: clear to auscultation bilaterally Cardio: regular rate and rhythm GI: normal findings: bowel sounds normal Extremities:Inspection of his hand reveals  a healing biopsy site.  He has a firm mass measuring approximately 15 x 8 mm..  He has full range of motion in his fingers and thumb.  Pulse and cap refill are intact.    Four views of his finger demonstrate a soft tissue mass. There is no sign of bony erosion.    Pulses: 2+ and symmetric Skin: normal Neurologic: Grossly normal    Assessment/Plan Impression: Biopsy-proven granular cell tumor, right ring finger.   Plan:To the OR for excisional biopsy under local anesthesia and sedation.  We should be able to primarily close the wound. If there is a problem we could perform a rotation flap for closure.   The surgery and aftercare were described in detail. We will accomplish this under local anesthesia and sedation at the [facility] on 01/21/13. DASNOIT,Chastity Noland J 01/21/2013, 8:58 AM   H&P documentation: 01/21/2013  -History and Physical Reviewed  -Patient has been re-examined  -No change in the plan of care  Wyn Forster, MD

## 2013-01-21 NOTE — Transfer of Care (Signed)
Immediate Anesthesia Transfer of Care Note  Patient: Devon Vega  Procedure(s) Performed: Procedure(s): BIOPSY OF MASS RIGHT RING FINGER (Right)  Patient Location: PACU  Anesthesia Type:MAC  Level of Consciousness: awake and alert   Airway & Oxygen Therapy: Patient Spontanous Breathing and Patient connected to face mask oxygen  Post-op Assessment: Report given to PACU RN and Post -op Vital signs reviewed and stable  Post vital signs: Reviewed and stable  Complications: No apparent anesthesia complications

## 2013-01-21 NOTE — Brief Op Note (Signed)
01/21/2013  12:44 PM  PATIENT:  Devon Vega  57 y.o. 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  POST-OPERATIVE DIAGNOSIS:  granular cell tumor right ring finger  PROCEDURE:  Procedure(s): BIOPSY OF MASS RIGHT RING FINGER (Right)  SURGEON:  Surgeon(s) and Role:    * Wyn Forster., MD - Primary  PHYSICIAN ASSISTANT:   ASSISTANTS: surgical technician  ANESTHESIA:   IV sedation  EBL:  Total I/O In: 700 [I.V.:700] Out: -   BLOOD ADMINISTERED:none  DRAINS: none   LOCAL MEDICATIONS USED:  XYLOCAINE   SPECIMEN:  Biopsy / Limited Resection  DISPOSITION OF SPECIMEN:  PATHOLOGY  COUNTS:  YES  TOURNIQUET:   Total Tourniquet Time Documented: Upper Arm (Right) - 23 minutes Total: Upper Arm (Right) - 23 minutes   DICTATION: .Other Dictation: Dictation Number [id number]  PLAN OF CARE: Discharge to home after PACU  PATIENT DISPOSITION:  PACU - hemodynamically stable.   Delay start of Pharmacological VTE agent (>24hrs) due to surgical blood loss or risk of bleeding: not applicable

## 2013-01-21 NOTE — Anesthesia Postprocedure Evaluation (Signed)
  Anesthesia Post-op Note  Patient: Devon Vega  Procedure(s) Performed: Procedure(s): BIOPSY OF MASS RIGHT RING FINGER (Right)  Patient Location: PACU  Anesthesia Type:MAC  Level of Consciousness: awake and alert   Airway and Oxygen Therapy: Patient Spontanous Breathing  Post-op Pain: mild  Post-op Assessment: Post-op Vital signs reviewed, Patient's Cardiovascular Status Stable, Respiratory Function Stable, Patent Airway, No signs of Nausea or vomiting and Pain level controlled  Post-op Vital Signs: stable  Complications: No apparent anesthesia complications

## 2013-01-21 NOTE — Anesthesia Preprocedure Evaluation (Addendum)
Anesthesia Evaluation  Patient identified by MRN, date of birth, ID band Patient awake    Reviewed: Allergy & Precautions, H&P , NPO status , Patient's Chart, lab work & pertinent test results, reviewed documented beta blocker date and time   Airway Mallampati: II TM Distance: >3 FB Neck ROM: Full    Dental no notable dental hx. (+) Teeth Intact and Dental Advisory Given   Pulmonary neg pulmonary ROS,    Pulmonary exam normal       Cardiovascular hypertension, On Medications and On Home Beta Blockers     Neuro/Psych PSYCHIATRIC DISORDERS negative neurological ROS     GI/Hepatic negative GI ROS, Neg liver ROS,   Endo/Other  negative endocrine ROS  Renal/GU negative Renal ROS  negative genitourinary   Musculoskeletal   Abdominal   Peds  Hematology negative hematology ROS (+)   Anesthesia Other Findings   Reproductive/Obstetrics negative OB ROS                          Anesthesia Physical Anesthesia Plan  ASA: II  Anesthesia Plan: MAC   Post-op Pain Management:    Induction: Intravenous  Airway Management Planned: Mask  Additional Equipment:   Intra-op Plan:   Post-operative Plan:   Informed Consent: I have reviewed the patients History and Physical, chart, labs and discussed the procedure including the risks, benefits and alternatives for the proposed anesthesia with the patient or authorized representative who has indicated his/her understanding and acceptance.   Dental advisory given  Plan Discussed with: CRNA  Anesthesia Plan Comments:         Anesthesia Quick Evaluation

## 2013-01-24 ENCOUNTER — Encounter (HOSPITAL_BASED_OUTPATIENT_CLINIC_OR_DEPARTMENT_OTHER): Payer: Self-pay | Admitting: Orthopedic Surgery

## 2013-01-24 NOTE — Op Note (Signed)
NAME:  Devon Vega, Devon Vega NO.:  [id number]  MEDICAL RECORD NO.:  [id number]  LOCATION:                               FACILITY:  [facility]  PHYSICIAN:  Devon Vega. Devon Vega, M.D. DATE OF BIRTH:  06-Jun-1956  DATE OF PROCEDURE:  01/21/2013 DATE OF DISCHARGE:  01/21/2013                              OPERATIVE REPORT   PREOPERATIVE DIAGNOSIS:  Biopsy-proven granular cell tumor, right ring finger, dorsal middle phalanx.  POSTOPERATIVE DIAGNOSIS:  Significant granular cell tumor, undermining skin margins and tracking along the dorsal, radial, sensory branch of finger.  OPERATION:  Wide marginal section of granular cell tumor with partial primary closure.  OPERATING SURGEON:  Devon Mayer, MD.  ASSISTANT:  Surgical technician.  ANESTHESIA:  Lidocaine 2% metacarpal head level block of right ring finger supplemented by IV sedation.  SUPERVISING ANESTHESIOLOGIST:  Devon Mayo, MD.  INDICATION:  Devon Vega is a 57 year old gentleman referred through the courtesy of Dr. Cherlyn Vega, dermatologist for evaluation and management of a granular cell tumor of the right ring finger middle phalangeal segment.  Devon Vega had consulted Dr. Terri Vega due to a finger bump.  A shave biopsy was performed in the office confirming a granular cell tumor. The deep and lateral margins were still involved.  Devon Vega was referred for Hand Surgery consult for definitive resection.  We pointed out to Devon Vega and Devon Vega that this tumor has a relatively complex behavior.  It can track along nerves.  The recurrence rate for benign lesions is approximately 8% to 10%, the recurrence rate for malignant granular cell tumors can approach at 50% after proper resection.  We advised Devon Vega to undergo wide marginal resection with proper histopathology with a marked biopsy specimen for proper geometric orientation.  We planned a primary closure at this time  with the possibility of need for wider margins and skin graft or rotation flap at a later date.  After detailed informed consent, he was brought to the operating room at this time.  PROCEDURE IN DETAIL:  Devon Vega was interviewed in the holding area and the proper surgical site identified on the right ring finger with a marking pen.  After detailed anesthesia and informed consent, he selected monitored anesthesia care.  He was brought to room 1 of the [facility] and placed in supine position on the operating table.  Following IV sedation, the right hand and finger were prepped with Betadine followed by infiltration of 5 mL of 2% plain lidocaine at metacarpal head level to obtain a digital block.  After 5 minutes, excellent anesthesia of the right ring finger was achieved.  The right hand and arm were then prepped with Betadine soap and solution, sterilely draped.  A pneumatic tourniquet was applied to the proximal right brachium.  Following exsanguination, right arm with Esmarch bandage, arterial tourniquet was inflated to 250 mmHg due to mild systolic hypertension.  The procedure commenced with planning of 5 mm margins.  These were mapped out with a marking pencil, followed by planning a possible rotation flap.  The procedure commenced with a scalpel incisions down to the level of the extensor tendon.  There was noted to be  significant undermining of the tumor across the dorsum of the finger to the midline and approximately the dorsoradial sensory branch of the finger was noted be involved with tumor tracking along the nerve approximately 1 cm.  The lesion was excised on mass at the end with the cautery of the adjacent vessels.  This was passed off in formalin for pathologic evaluation.  We then carefully repaired the wound about 80% with trauma sutures of 4- 0 Prolene and placed an intradermal 4-0 Prolene suture for secondary closure in the office after  a biopsy was reviewed.  The concern of this lesion is possible tracking along the sensory branch.  We will wait margins before we were able to decide a further coverage is necessary or wider margins are necessary.  Devon Vega wound was dressed with Devon Vega sterile gauze and a Coban finger and wrist dressing.  There were no apparent complications.     Devon Vega, M.D.     RVS/MEDQ  D:  01/21/2013  T:  01/21/2013  Job:  [id number]

## 2013-01-28 ENCOUNTER — Other Ambulatory Visit: Payer: Self-pay | Admitting: Orthopedic Surgery

## 2013-01-28 ENCOUNTER — Encounter (HOSPITAL_BASED_OUTPATIENT_CLINIC_OR_DEPARTMENT_OTHER): Payer: Self-pay | Admitting: *Deleted

## 2013-01-28 NOTE — Progress Notes (Signed)
Pt here for bx finger 01/21/13-had ekg and bmet-no more labs needed

## 2013-02-03 ENCOUNTER — Encounter (HOSPITAL_BASED_OUTPATIENT_CLINIC_OR_DEPARTMENT_OTHER): Payer: Self-pay | Admitting: Orthopedic Surgery

## 2013-02-03 ENCOUNTER — Encounter (HOSPITAL_BASED_OUTPATIENT_CLINIC_OR_DEPARTMENT_OTHER)
Admission: RE | Disposition: A | Discharge status: Home / Self Care | Payer: Self-pay | Source: Ambulatory Visit | Attending: Orthopedic Surgery

## 2013-02-03 ENCOUNTER — Ambulatory Visit (HOSPITAL_BASED_OUTPATIENT_CLINIC_OR_DEPARTMENT_OTHER)
Admission: RE | Admit: 2013-02-03 | Discharge: 2013-02-03 | Disposition: A | Discharge status: Home / Self Care | Payer: 59 | Source: Ambulatory Visit | Attending: Orthopedic Surgery | Admitting: Orthopedic Surgery

## 2013-02-03 ENCOUNTER — Encounter (HOSPITAL_BASED_OUTPATIENT_CLINIC_OR_DEPARTMENT_OTHER): Payer: Self-pay | Admitting: Anesthesiology

## 2013-02-03 ENCOUNTER — Ambulatory Visit (HOSPITAL_BASED_OUTPATIENT_CLINIC_OR_DEPARTMENT_OTHER): Payer: 59 | Admitting: Anesthesiology

## 2013-02-03 DIAGNOSIS — D2360 Other benign neoplasm of skin of unspecified upper limb, including shoulder: Secondary | ICD-10-CM | POA: Insufficient documentation

## 2013-02-03 DIAGNOSIS — F1011 Alcohol abuse, in remission: Secondary | ICD-10-CM | POA: Insufficient documentation

## 2013-02-03 DIAGNOSIS — E785 Hyperlipidemia, unspecified: Secondary | ICD-10-CM | POA: Insufficient documentation

## 2013-02-03 DIAGNOSIS — I10 Essential (primary) hypertension: Secondary | ICD-10-CM | POA: Insufficient documentation

## 2013-02-03 DIAGNOSIS — Z79899 Other long term (current) drug therapy: Secondary | ICD-10-CM | POA: Insufficient documentation

## 2013-02-03 DIAGNOSIS — N529 Male erectile dysfunction, unspecified: Secondary | ICD-10-CM | POA: Insufficient documentation

## 2013-02-03 DIAGNOSIS — Z888 Allergy status to other drugs, medicaments and biological substances status: Secondary | ICD-10-CM | POA: Insufficient documentation

## 2013-02-03 DIAGNOSIS — Z885 Allergy status to narcotic agent status: Secondary | ICD-10-CM | POA: Insufficient documentation

## 2013-02-03 DIAGNOSIS — F17200 Nicotine dependence, unspecified, uncomplicated: Secondary | ICD-10-CM | POA: Insufficient documentation

## 2013-02-03 HISTORY — PX: SKIN FULL THICKNESS GRAFT: SHX442

## 2013-02-03 HISTORY — PX: EXCISION METACARPAL MASS: SHX6372

## 2013-02-03 LAB — POCT HEMOGLOBIN-HEMACUE: Hemoglobin: 15.1 g/dL (ref 13.0–17.0)

## 2013-02-03 SURGERY — EXCISION METACARPAL MASS
Anesthesia: General | Site: Hand | Laterality: Right | Wound class: Clean

## 2013-02-03 MED ORDER — LIDOCAINE HCL 2 % IJ SOLN
INTRAMUSCULAR | Status: DC | PRN
  Administered 2013-02-03: 5 mL

## 2013-02-03 MED ORDER — ONDANSETRON HCL 4 MG/2ML IJ SOLN
INTRAMUSCULAR | Status: DC | PRN
  Administered 2013-02-03: 4 mg via INTRAVENOUS

## 2013-02-03 MED ORDER — FENTANYL CITRATE 0.05 MG/ML IJ SOLN
INTRAMUSCULAR | Status: DC | PRN
  Administered 2013-02-03: 100 ug via INTRAVENOUS

## 2013-02-03 MED ORDER — DEXAMETHASONE SODIUM PHOSPHATE 10 MG/ML IJ SOLN
INTRAMUSCULAR | Status: DC | PRN
  Administered 2013-02-03: 8 mg via INTRAVENOUS

## 2013-02-03 MED ORDER — HYDROMORPHONE HCL 2 MG PO TABS: 2.0000 mg | ORAL_TABLET | ORAL | Status: DC | PRN

## 2013-02-03 MED ORDER — CHLORHEXIDINE GLUCONATE 4 % EX LIQD: 60.0000 mL | Freq: Once | CUTANEOUS | Status: DC

## 2013-02-03 MED ORDER — MIDAZOLAM HCL 5 MG/5ML IJ SOLN
INTRAMUSCULAR | Status: DC | PRN
  Administered 2013-02-03: 2 mg via INTRAVENOUS

## 2013-02-03 MED ORDER — PROPOFOL 10 MG/ML IV BOLUS
INTRAVENOUS | Status: DC | PRN
  Administered 2013-02-03: 200 mg via INTRAVENOUS

## 2013-02-03 MED ORDER — LACTATED RINGERS IV SOLN
INTRAVENOUS | Status: DC
  Administered 2013-02-03 (×2): via INTRAVENOUS

## 2013-02-03 MED ORDER — LIDOCAINE HCL (CARDIAC) 20 MG/ML IV SOLN
INTRAVENOUS | Status: DC | PRN
  Administered 2013-02-03: 80 mg via INTRAVENOUS

## 2013-02-03 SURGICAL SUPPLY — 58 items
BANDAGE ADHESIVE 1X3 (GAUZE/BANDAGES/DRESSINGS) IMPLANT
BANDAGE ELASTIC 3 VELCRO ST LF (GAUZE/BANDAGES/DRESSINGS) IMPLANT
BLADE MINI RND TIP GREEN BEAV (BLADE) ×1 IMPLANT
BLADE SURG 15 STRL LF DISP TIS (BLADE) ×1 IMPLANT
BLADE SURG 15 STRL SS (BLADE) ×2
BNDG CMPR 9X4 STRL LF SNTH (GAUZE/BANDAGES/DRESSINGS) ×1
BNDG CMPR MD 5X2 ELC HKLP STRL (GAUZE/BANDAGES/DRESSINGS) ×1
BNDG COHESIVE 1X5 TAN STRL LF (GAUZE/BANDAGES/DRESSINGS) ×1 IMPLANT
BNDG ELASTIC 2 VLCR STRL LF (GAUZE/BANDAGES/DRESSINGS) ×1 IMPLANT
BNDG ESMARK 4X9 LF (GAUZE/BANDAGES/DRESSINGS) ×1 IMPLANT
BRUSH SCRUB EZ PLAIN DRY (MISCELLANEOUS) ×2 IMPLANT
CLOTH BEACON ORANGE TIMEOUT ST (SAFETY) ×2 IMPLANT
CORDS BIPOLAR (ELECTRODE) ×1 IMPLANT
COVER MAYO STAND STRL (DRAPES) ×2 IMPLANT
COVER TABLE BACK 60X90 (DRAPES) ×2 IMPLANT
CUFF TOURNIQUET SINGLE 18IN (TOURNIQUET CUFF) ×1 IMPLANT
DECANTER SPIKE VIAL GLASS SM (MISCELLANEOUS) IMPLANT
DRAIN PENROSE 1/4X12 LTX STRL (WOUND CARE) IMPLANT
DRAPE EXTREMITY T 121X128X90 (DRAPE) ×2 IMPLANT
DRAPE SURG 17X23 STRL (DRAPES) ×2 IMPLANT
DRESSING TELFA 8X3 (GAUZE/BANDAGES/DRESSINGS) IMPLANT
DRSG EMULSION OIL 3X3 NADH (GAUZE/BANDAGES/DRESSINGS) ×1 IMPLANT
DRSG TEGADERM 4X4.75 (GAUZE/BANDAGES/DRESSINGS) ×1 IMPLANT
GLOVE BIOGEL M STRL SZ7.5 (GLOVE) ×1 IMPLANT
GLOVE ECLIPSE 6.5 STRL STRAW (GLOVE) ×2 IMPLANT
GLOVE EPREMIER NITRL EXT CFF L (GLOVE) IMPLANT
GLOVE EXAM NITRILE EXT CFF LRG (GLOVE) ×2 IMPLANT
GLOVE ORTHO TXT STRL SZ7.5 (GLOVE) ×2 IMPLANT
GOWN BRE IMP PREV XXLGXLNG (GOWN DISPOSABLE) ×3 IMPLANT
GOWN PREVENTION PLUS XLARGE (GOWN DISPOSABLE) ×2 IMPLANT
NDL SAFETY ECLIPSE 18X1.5 (NEEDLE) IMPLANT
NEEDLE 27GAX1X1/2 (NEEDLE) ×1 IMPLANT
NEEDLE HYPO 18GX1.5 SHARP (NEEDLE) ×2
NS IRRIG 1000ML POUR BTL (IV SOLUTION) ×2 IMPLANT
PACK BASIN DAY SURGERY FS (CUSTOM PROCEDURE TRAY) ×2 IMPLANT
PAD CAST 3X4 CTTN HI CHSV (CAST SUPPLIES) IMPLANT
PADDING CAST ABS 4INX4YD NS (CAST SUPPLIES)
PADDING CAST ABS COTTON 4X4 ST (CAST SUPPLIES) ×1 IMPLANT
PADDING CAST COTTON 3X4 STRL (CAST SUPPLIES)
SPONGE GAUZE 4X4 12PLY (GAUZE/BANDAGES/DRESSINGS) ×2 IMPLANT
STOCKINETTE 4X48 STRL (DRAPES) ×2 IMPLANT
STRIP CLOSURE SKIN 1/2X4 (GAUZE/BANDAGES/DRESSINGS) ×2 IMPLANT
SUT CHROMIC 5 0 P 3 (SUTURE) ×1 IMPLANT
SUT ETHILON 5 0 P 3 18 (SUTURE)
SUT ETHILON 5 0 PS 2 18 (SUTURE) IMPLANT
SUT NYLON ETHILON 5-0 P-3 1X18 (SUTURE) ×1 IMPLANT
SUT PROLENE 3 0 PS 2 (SUTURE) ×3 IMPLANT
SUT PROLENE 4 0 P 3 18 (SUTURE) ×1 IMPLANT
SUT VIC AB 2-0 SH 27 (SUTURE)
SUT VIC AB 2-0 SH 27XBRD (SUTURE) IMPLANT
SUT VIC AB 4-0 P-3 18XBRD (SUTURE) IMPLANT
SUT VIC AB 4-0 P3 18 (SUTURE) ×2
SUT VICRYL 4-0 PS2 18IN ABS (SUTURE) IMPLANT
SYR 3ML 23GX1 SAFETY (SYRINGE) IMPLANT
SYR CONTROL 10ML LL (SYRINGE) ×1 IMPLANT
TOWEL OR 17X24 6PK STRL BLUE (TOWEL DISPOSABLE) ×4 IMPLANT
TRAY DSU PREP LF (CUSTOM PROCEDURE TRAY) ×2 IMPLANT
UNDERPAD 30X30 INCONTINENT (UNDERPADS AND DIAPERS) ×2 IMPLANT

## 2013-02-03 NOTE — Anesthesia Procedure Notes (Signed)
Procedure Name: LMA Insertion Performed by: Malcolm Hetz, [place] Pre-anesthesia Checklist: Patient identified, Emergency Drugs available, Suction available and Patient being monitored Patient Re-evaluated:Patient Re-evaluated prior to inductionOxygen Delivery Method: Circle System Utilized Preoxygenation: Pre-oxygenation with 100% oxygen Intubation Type: IV induction Ventilation: Mask ventilation without difficulty LMA: LMA inserted LMA Size: 4.0 Number of attempts: 1 Airway Equipment and Method: bite block Placement Confirmation: positive ETCO2 Tube secured with: Tape Dental Injury: Teeth and Oropharynx as per pre-operative assessment      

## 2013-02-03 NOTE — Brief Op Note (Signed)
02/03/2013  10:22 AM  PATIENT:  Devon Vega  57 y.o. male  PRE-OPERATIVE DIAGNOSIS:  RESIDUAL GRANULAR CELL TUMOR right ring finger  POST-OPERATIVE DIAGNOSIS:  RESIDUAL GRANULAR CELL TUMOR right ring finger  PROCEDURE:  Procedure(s): WIDE RESECTION OF GRANULAR CELL TUMOR (Right) SKIN GRAFT FULL THICKNESS RIGHT RING FINGER (Right)  SURGEON:  Surgeon(s) and Role:    * Wyn Forster., MD - Primary  PHYSICIAN ASSISTANT:   ASSISTANTS: Surgical technician   ANESTHESIA:   general  EBL:  Total I/O In: 1000 [I.V.:1000] Out: -   BLOOD ADMINISTERED:none  DRAINS: none   LOCAL MEDICATIONS USED:  XYLOCAINE   SPECIMEN:  Biopsy / Limited Resection  DISPOSITION OF SPECIMEN:  PATHOLOGY  COUNTS:  YES  TOURNIQUET:   Total Tourniquet Time Documented: Upper Arm (Right) - 39 minutes Total: Upper Arm (Right) - 39 minutes   DICTATION: .[id number]  PLAN OF CARE: Discharge to home after PACU  PATIENT DISPOSITION:  PACU - hemodynamically stable.   Delay start of Pharmacological VTE agent (>24hrs) due to surgical blood loss or risk of bleeding: not applicable

## 2013-02-03 NOTE — Anesthesia Preprocedure Evaluation (Signed)
Anesthesia Evaluation  Patient identified by MRN, date of birth, ID band Patient awake    Reviewed: Allergy & Precautions, H&P , NPO status , Patient's Chart, lab work & pertinent test results, reviewed documented beta blocker date and time   Airway Mallampati: I TM Distance: >3 FB Neck ROM: Full    Dental  (+) Teeth Intact and Dental Advisory Given   Pulmonary  breath sounds clear to auscultation        Cardiovascular hypertension, Pt. on medications and Pt. on home beta blockers Rhythm:Regular Rate:Normal     Neuro/Psych    GI/Hepatic   Endo/Other    Renal/GU      Musculoskeletal   Abdominal   Peds  Hematology   Anesthesia Other Findings   Reproductive/Obstetrics                           Anesthesia Physical Anesthesia Plan  ASA: II  Anesthesia Plan: General   Post-op Pain Management:    Induction: Intravenous  Airway Management Planned: LMA  Additional Equipment:   Intra-op Plan:   Post-operative Plan: Extubation in OR  Informed Consent: I have reviewed the patients History and Physical, chart, labs and discussed the procedure including the risks, benefits and alternatives for the proposed anesthesia with the patient or authorized representative who has indicated his/her understanding and acceptance.   Dental advisory given  Plan Discussed with: CRNA, Anesthesiologist and Surgeon  Anesthesia Plan Comments:         Anesthesia Quick Evaluation

## 2013-02-03 NOTE — H&P (Addendum)
  Mr Easterday's biopsy revealed a inadequate margin on the dorsal and radial aspect from index biopsy. We need a wider resection margin and full thickness skin graft for coverage. Detailed informed consent was accomplished in the office.  The biopsy data has been reviewed with Mr Hemmelgarn. H&P documentation: 02/03/2013  -History and Physical Reviewed  -Patient has been re-examined  -No change in the plan of care  Wyn Forster, MD

## 2013-02-03 NOTE — Anesthesia Postprocedure Evaluation (Signed)
  Anesthesia Post-op Note  Patient: Devon Vega  Procedure(s) Performed: Procedure(s): WIDE RESECTION OF GRANULAR CELL TUMOR (Right) SKIN GRAFT FULL THICKNESS RIGHT RING FINGER (Right)  Patient Location: PACU  Anesthesia Type:General  Level of Consciousness: awake, alert  and oriented  Airway and Oxygen Therapy: Patient Spontanous Breathing  Post-op Pain: none  Post-op Assessment: Post-op Vital signs reviewed  Post-op Vital Signs: Reviewed  Complications: No apparent anesthesia complications

## 2013-02-03 NOTE — Op Note (Signed)
[id number] 

## 2013-02-03 NOTE — Transfer of Care (Signed)
Immediate Anesthesia Transfer of Care Note  Patient: Devon Vega  Procedure(s) Performed: Procedure(s): WIDE RESECTION OF GRANULAR CELL TUMOR (Right) SKIN GRAFT FULL THICKNESS RIGHT RING FINGER (Right)  Patient Location: PACU  Anesthesia Type:General  Level of Consciousness: awake and alert   Airway & Oxygen Therapy: Patient Spontanous Breathing and Patient connected to face mask oxygen  Post-op Assessment: Report given to PACU RN and Post -op Vital signs reviewed and stable  Post vital signs: Reviewed and stable  Complications: No apparent anesthesia complications

## 2013-02-04 ENCOUNTER — Encounter (HOSPITAL_BASED_OUTPATIENT_CLINIC_OR_DEPARTMENT_OTHER): Payer: Self-pay | Admitting: Orthopedic Surgery

## 2013-02-04 NOTE — Op Note (Signed)
NAME:  Devon, Vega                 ACCOUNT NO.:  MEDICAL RECORD NO.:  [id number]  LOCATION:                                 FACILITY:  PHYSICIAN:  Katy Fitch. Biannca Scantlin, M.D.      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  OPERATION: 1. Harvest of 3 x 2 cm full-thickness skin graft, with primary closure     of donor site. 2. Wide resection of the dorsal radial aspect of granular cell tumor     site, dorsum, right ring finger, middle phalanx. 3.  Full thickness graft coverage os biopsy site right dorsal ring finger,     middle phalanx.  SURGEON:  Katy Fitch. Ary Rudnick, M.D.  ASSISTANT:  Surgical tech.  ANESTHESIA:  General by LMA.  SUPERVISING ANESTHESIOLOGIST:  Sheldon Silvan, M.D.  INDICATIONS:  Devon Vega is a 57 year old gentleman referred through the courtesy of Dr. Cherlyn Roberts, dermatologist, for management of a granular cell tumor of the right ring finger.  Dr. Terri Piedra had performed a shave biopsy confirming the histopathologic diagnosis.  He referred Devon Vega for definitive resection.  On January 21, 2013, we performed resection of the mass with "normal" gross margins and a 5-mm clinical margin.  The biopsy specimen was labeled with Prolene suture at 12 o'clock.  We awaited the pathologic evaluation of our clinical pathology colleagues.  The specimen was reviewed by Dr. Colonel Bald and he noted some infiltrating intradermal tumor on the dorsal radial margin at 9 o'clock.  I contacted  the pathology department and had Dr. Hollice Espy perform a second look at the biopsy to determine margins.  I was particularly concerned about possible tracking along veins or nerves on the dorsum Where, grossly, there appeared to be some concern.  Dr. Dierdre Searles reported that the deep margins; the 12 o'clock margin, 3 o'clock margin, and 6 o'clock margin were clean but there was some infiltrating granular cell tumor at the 9 o'clock margin.  We advised Devon Vega that we did not have microscopic margins of his tumor and recommended repeat wide resection.  After informed consent, he was brought to the operating room at this time anticipating a full-thickness skin graft harvested from the medial right brachium for coverage.  DESCRIPTION OF PROCEDURE:  Devon Vega was interviewed in the holding area and the surgical plan reviewed once again.  His right upper extremity was marked per the proper site identification protocol with a marking pen.  Devon Vega was interviewed by Dr. Ivin Booty, who recommended general anesthesia by LMA technique.  He was brought to room 1 of the [facility] and placed in supine position on the operating table.  Under Dr. Ivin Booty direct supervision, general anesthesia by LMA technique was induced followed by routine Betadine  scrub and paint of the right upper extremity.  A pneumatic tourniquet was applied high on the proximal brachium.  Sterile stockinette and impervious arthroscopy drapes was applied followed by a routine surgical time-out.  The margins of the anticipated donor graft were marked with a marking pen followed by exsanguination of the right arm and hand with an Esmarch bandage, inflation of the arterial tourniquet to 220 mmHg.  The procedure commenced with the use of virgin instruments to harvest the skin graft from the medial brachium above the medial epicondyle.  A 3 x 2 cm full-thickness graft was harvested.  This was thinned to  a thick partial-thickness graft by defatting with scissors.  The graft was placed in a saline-soaked sponge and passed off.  The wound margins were undermined, and the wound repaired with subcutaneous 4-0 Vicryl and intradermal 3-0 Prolene segmental suture with Steri-Strips.  Lidocaine 2% was infiltrated into the margins of the donor site for postoperative analgesia.  Attention was then directed to the ring finger.  With the aid of a sterile ruler, we measured 5-mm margins around the radial aspect of the lesion and also took a 2 mm margin at the previous resection site.  The incisions were taken sharply to the level of the paratenon.  We carefully undermined the former biopsy site directly off the paratenon of the extensor mechanism.  All veins crossing the region were electrocauterized and transected and removed en bloc with the biopsy specimen.  With the aid of loupe magnification, I carefully examined the specimen as well as the resected skin margin.  I could identify no evidence of tumor along the margin.  By palpation and inspection this appeared to be clear.  An alternative to this strategy would be to perform Mohs surgery should there still be issues with margin.  Based on my discussion with Dr. Dierdre Searles preoperatively, she felt that the infiltration was microscopic along this margin, therefore, we should have an adequate margin with this resection.  The donor site was inspect for bleeding points followed by application of a full-thickness skin graft, with corner sutures of 4-0 Prolene and running 5 0 chromic.  The graft was then fixed with a bolster of folded Adaptic followed by Adaptic, sterile gauze, and a Coban finger dressing.  There were no apparent complications.  The finger had a digital block placed for postoperative comfort followed by release of the tourniquet.  Devon Vega was awakened from general anesthesia and transferred to recovery room with stable vital  signs.  For aftercare, he was provided prescription for Percocet 5 mg 1 or 2 tablets p.o. q.4-6 hours p.r.n. pain.  We will see him back for followup in our office in 7-8 days for dressing change and discussion of his pathology.     Katy Fitch Eira Alpert, M.D.     RVS/MEDQ  D:  02/03/2013  T:  02/04/2013  Job:  [id number]  cc:   Gelene Mink A. Worthy Rancher, M.D.

## 2013-04-22 ENCOUNTER — Encounter: Payer: Self-pay | Admitting: Internal Medicine

## 2013-04-22 ENCOUNTER — Ambulatory Visit (INDEPENDENT_AMBULATORY_CARE_PROVIDER_SITE_OTHER): Payer: 59 | Admitting: Internal Medicine

## 2013-04-22 VITALS — BP 182/97 | HR 69 | Temp 98.5°F | Ht 65.5 in | Wt 161.7 lb

## 2013-04-22 DIAGNOSIS — E785 Hyperlipidemia, unspecified: Secondary | ICD-10-CM

## 2013-04-22 DIAGNOSIS — I10 Essential (primary) hypertension: Secondary | ICD-10-CM

## 2013-04-22 DIAGNOSIS — IMO0002 Reserved for concepts with insufficient information to code with codable children: Secondary | ICD-10-CM

## 2013-04-22 DIAGNOSIS — D219 Benign neoplasm of connective and other soft tissue, unspecified: Secondary | ICD-10-CM

## 2013-04-22 DIAGNOSIS — F17200 Nicotine dependence, unspecified, uncomplicated: Secondary | ICD-10-CM

## 2013-04-22 MED ORDER — BLOOD PRESSURE CUFF MISC: 1.0000 | Freq: Two times a day (BID) | Status: DC

## 2013-04-22 MED ORDER — ROSUVASTATIN CALCIUM 10 MG PO TABS: 10.0000 mg | ORAL_TABLET | Freq: Every day | ORAL | Status: DC

## 2013-04-22 MED ORDER — AMLODIPINE BESYLATE 10 MG PO TABS: 10.0000 mg | ORAL_TABLET | Freq: Every day | ORAL | Status: DC

## 2013-04-22 MED ORDER — HYDROCODONE-ACETAMINOPHEN 5-325 MG PO TABS: 1.0000 | ORAL_TABLET | Freq: Every day | ORAL | Status: DC | PRN

## 2013-04-22 NOTE — Assessment & Plan Note (Signed)
No red flags. Patient has a pain contract. Refill Vicodin. Will follow up with his new PCP, Dr. Aundria Rud in one month.

## 2013-04-22 NOTE — Assessment & Plan Note (Signed)
BP Readings from Last 3 Encounters:  04/22/13 182/97  02/03/13 165/84  02/03/13 165/84    Lab Results  Component Value Date   NA 138 01/20/2013   K 4.0 01/20/2013   CREATININE 0.93 01/20/2013    Assessment: Blood pressure control: severely elevated Progress toward BP goal:  deteriorated Comments: Out of medications for 2 days. Smoked 5 cigarettes prior to clinic visit. Previous BPs have been fairly controlled.  Plan: Medications:  continue current medications Educational resources provided: brochure Self management tools provided: home blood pressure logbook Other plans:  1. Will not change his medications today, given noncompliance and cigarette smoking just before his clinic visit. 2. Showed video for management of hypertension 3. Called in her blood pressure cuff, and encouraged him to check his blood pressure twice daily, and bring up her pressure log to his clinic appointment in 2 weeks. 4. Patient expressed concern about co-pays for his clinic visits here. I informed him that if his home blood pressure is not more than 140/90 he may in 1 month instead of 2 weeks.

## 2013-04-22 NOTE — Patient Instructions (Signed)
General Instructions: Please take your medications  If your blood pressure is high than 140/90 at home, you should call for an appointment in 2 weeks. Otherwise, you will see Dr Aundria Rud in 1-2 months.  Please stop smoking before smoking stops you. I have called in your medications at [facility].  Treatment Goals:  Goals (1 Years of Data) as of 04/22/13   None      Progress Toward Treatment Goals:  Treatment Goal 04/22/2013  Blood pressure deteriorated  Stop smoking smoking the same amount    Self Care Goals & Plans:  Self Care Goal 04/22/2013  Manage my medications take my medicines as prescribed; bring my medications to every visit; refill my medications on time  Eat healthy foods drink diet soda or water instead of juice or soda; eat more vegetables; eat foods that are low in salt; eat baked foods instead of fried foods    No flowsheet data found.   Care Management & Community Referrals:  No flowsheet data found.

## 2013-04-22 NOTE — Assessment & Plan Note (Addendum)
  Assessment: Progress toward smoking cessation:  smoking the same amount Barriers to progress toward smoking cessation:  stress Comments: 5 cig prior to OV  Plan: Instruction/counseling given:  I counseled patient on the dangers of tobacco use, advised patient to stop smoking, and reviewed strategies to maximize success. Educational resources provided:  [employer]) brochure Self management tools provided:    Medications to assist with smoking cessation:  None Patient agreed to the following self-care plans for smoking cessation:    Other plans: I assessed patient's readiness for quitting - not ready. Provided counseling.

## 2013-04-22 NOTE — Assessment & Plan Note (Signed)
Followup with Dr. Teressa Senter when necessary

## 2013-04-22 NOTE — Progress Notes (Signed)
Patient ID: Devon Vega, male   DOB: 03-03-1956, 57 y.o.   MRN: [id number]   Subjective:   HPI: Devon Vega is a 57 y.o. 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  Patient reports that he ran out of his Vicodin on 4 days ago and since then his chronic pain has recurred. Patient has a pain contract with [place] and receives Vicodin 5-325 mg to take once daily as needed. No new symptoms.  He reports that he ran out of his amlodipine, and Coreg, together with Crestor 2 days ago. He has also smoked 5 cigarettes before coming to the clinic. He needs refills on his medications as well.  Kindly see the A&P for the status of the pt's chronic medical problems.   Past Medical History  Diagnosis Date  . Alcohol abuse     hx of , sober since 1997  . Tobacco abuse     failed zyban  . 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 Weight gain   . Hyperlipidemia   . Hypertension    Current Outpatient Prescriptions  Medication Sig Dispense Refill  . amLODipine (NORVASC) 10 MG tablet Take 1 tablet (10 mg total) by mouth daily.  30 tablet  11  . Blood Pressure Monitoring (BLOOD PRESSURE CUFF) MISC 1 kit by Does not apply route 2 (two) times daily. Check blood pressure in morning and afternoon.  1 each  2  . carvedilol (COREG) 12.5 MG tablet take 1 tablet by mouth twice a day with food  60 tablet  11  . HYDROcodone-acetaminophen (NORCO/VICODIN) 5-325 MG per tablet Take 1 tablet by mouth daily as needed for pain.  30 tablet  0  . mometasone (NASONEX) 50 MCG/ACT nasal spray Place 2 sprays into the nose daily. As needed for allergy symptoms       . rosuvastatin (CRESTOR) 10 MG tablet Take 1 tablet (10 mg total) by mouth daily.  30 tablet  11   No current facility-administered medications for this visit.    Family History  Problem Relation Age of Onset  . Cancer Father    History   Social History  . Marital Status: Divorced    Spouse Name: N/A    Number of Children: N/A  . Years of Education: N/A   Social History Main Topics  . Smoking status: Current Every Day Smoker -- 0.50 packs/day    Types: Cigarettes, Cigars  . Smokeless tobacco: None     Comment: occasional cigar  . Alcohol Use: Yes     Comment: 1 -2 beers on the w/e  . Drug Use: No  . Sexual Activity: None   Other Topics Concern  . 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  Respiratory: Denies SOB, DOE, cough, chest tightness, and wheezing. Denies chest pain. Cardiovascular: No chest pain, palpitations and leg swelling.  Gastrointestinal: No abdominal pain, nausea, vomiting, bloody stools Genitourinary: No dysuria, frequency, hematuria, or flank pain.  Musculoskeletal: chronic neck pain. No other  pains except his right fourth finger which was operated on in 01/2013. Psych: No depression symptoms. No SI or SA.   Objective:  Physical Exam: Filed Vitals:   04/22/13 0958 04/22/13 1044  BP: 199/88 182/97  Pulse: 70 69  Temp: 98.5 F (36.9 C)   TempSrc: Oral   Height: 5' 5.5" (1.664 m)   Weight: 161 lb 11.2 oz (73.347 kg)   SpO2: 100%    General: Well nourished. No acute distress.  HEENT: Normal oral mucosa. MMM.  Lungs: CTA bilaterally. Heart: RRR; no extra sounds or murmurs  Abdomen: Non-distended, normal BS, soft, nontender; no hepatosplenomegaly  Extremities: No pedal edema. No joint swelling or tenderness. Back: mild tenderness of palpation of the posterior cervical  spine Neurologic: Normal EOM,  Alert and oriented x3. No obvious neurologic/cranial nerve deficits.  Assessment & Plan:  I have discussed my assessment and plan  with  my attending in the clinic, Dr. Rogelia Boga as detailed under problem based charting.

## 2013-04-24 NOTE — Progress Notes (Signed)
Case discussed with Dr.Kazibwe at the time of the visit.  We reviewed the resident's history and exam and pertinent patient test results.  I agree with the assessment, diagnosis, and plan of care documented in the resident's note.    

## 2013-05-18 ENCOUNTER — Other Ambulatory Visit: Payer: Self-pay | Admitting: *Deleted

## 2013-05-18 ENCOUNTER — Encounter: Payer: Self-pay | Admitting: Internal Medicine

## 2013-05-18 ENCOUNTER — Other Ambulatory Visit: Payer: Self-pay | Admitting: Internal Medicine

## 2013-05-18 DIAGNOSIS — IMO0002 Reserved for concepts with insufficient information to code with codable children: Secondary | ICD-10-CM

## 2013-05-18 MED ORDER — HYDROCODONE-ACETAMINOPHEN 5-325 MG PO TABS: 1.0000 | ORAL_TABLET | Freq: Every day | ORAL | Status: DC | PRN

## 2013-05-18 NOTE — Telephone Encounter (Signed)
Last filled 10/10 for # 30  Pt # [phone]

## 2013-05-18 NOTE — Telephone Encounter (Signed)
Pt informed Rx is ready 

## 2013-07-29 ENCOUNTER — Ambulatory Visit (INDEPENDENT_AMBULATORY_CARE_PROVIDER_SITE_OTHER): Payer: BC Managed Care – PPO | Admitting: Internal Medicine

## 2013-07-29 ENCOUNTER — Encounter: Payer: Self-pay | Admitting: Internal Medicine

## 2013-07-29 VITALS — BP 187/84 | HR 71 | Temp 97.7°F | Ht 65.0 in | Wt 167.1 lb

## 2013-07-29 DIAGNOSIS — IMO0002 Reserved for concepts with insufficient information to code with codable children: Secondary | ICD-10-CM

## 2013-07-29 DIAGNOSIS — I10 Essential (primary) hypertension: Secondary | ICD-10-CM

## 2013-07-29 DIAGNOSIS — E785 Hyperlipidemia, unspecified: Secondary | ICD-10-CM

## 2013-07-29 DIAGNOSIS — M5030 Other cervical disc degeneration, unspecified cervical region: Secondary | ICD-10-CM

## 2013-07-29 DIAGNOSIS — F17200 Nicotine dependence, unspecified, uncomplicated: Secondary | ICD-10-CM

## 2013-07-29 DIAGNOSIS — Z299 Encounter for prophylactic measures, unspecified: Secondary | ICD-10-CM

## 2013-07-29 MED ORDER — CHLORTHALIDONE 25 MG PO TABS: 25.0000 mg | ORAL_TABLET | Freq: Every day | ORAL | Status: DC

## 2013-07-29 NOTE — Progress Notes (Signed)
Patient ID: Devon Vega, male   DOB: [birth year], 58 y.o.   MRN: [id number]   Subjective:   Patient ID: Devon Vega male   DOB: 08-Dec-1955 58 y.o.   MRN: [id number]  HPI: [name] is a 58 y.o. man with history of HTN, HL, DDD, tobacco abuse who presents for routine follow-up.   Patient states he doing well, no complaints.  Reports good medication compliance.    BP elevated at 187/84 today, similar on repeat.  No headaches, dizziness. He states that he checks his BP at home, usually about twice per week, has been running in 150s/80s at home.   He continues to smoke reportedly 1/2 PPD but states that he "smokes all day." He did smoke just prior to coming to clinic.  He is not interested in quitting or even cutting back at this time.    He already got his flu shot at work.   Past Medical History  Diagnosis Date  . Alcohol abuse     hx of , sober since 1997  . Tobacco abuse     failed zyban  . 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 Weight gain   . Hyperlipidemia   . Hypertension    Current Outpatient Prescriptions  Medication Sig Dispense Refill  . amLODipine (NORVASC) 10 MG tablet Take 1 tablet (10 mg total) by mouth daily.  30 tablet  11  . Blood Pressure Monitoring (BLOOD PRESSURE CUFF) MISC 1 kit by Does not apply route 2 (two) times daily. Check blood pressure in morning and afternoon.  1 each  2  . carvedilol (COREG) 12.5 MG tablet take 1 tablet by mouth twice a day with food  60 tablet  11  . HYDROcodone-acetaminophen (NORCO/VICODIN) 5-325 MG per tablet Take 1 tablet by mouth daily as needed.  30 tablet  0  . mometasone (NASONEX) 50 MCG/ACT nasal spray Place 2 sprays into the nose daily. As needed for allergy symptoms       . rosuvastatin (CRESTOR) 10 MG tablet Take 1 tablet (10 mg total) by mouth daily.  30 tablet  11   No current facility-administered medications for this visit.   Family History  Problem Relation Age of Onset  . Cancer  Father    History   Social History  . Marital Status: Divorced    Spouse Name: N/A    Number of Children: N/A  . Years of Education: N/A   Social History Main Topics  . Smoking status: Current Every Day Smoker -- 0.50 packs/day    Types: Cigarettes, Cigars  . Smokeless tobacco: None     Comment: occasional cigar  . Alcohol Use: Yes     Comment: 1 -2 beers on the w/e  . Drug Use: No  . Sexual Activity: None   Other Topics Concern  . 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  Eyes: Negative for blurred vision.  Respiratory: Negative for cough and shortness of breath.   Cardiovascular: Negative for chest pain and leg swelling.  Gastrointestinal: Negative for nausea, vomiting, abdominal pain, diarrhea, constipation and blood  in stool.  Genitourinary: Negative for dysuria.  Neurological: Negative for dizziness, loss of consciousness, weakness and headaches.    Objective:  Physical Exam: Filed Vitals:   07/29/13 1514  BP: 187/84  Pulse: 71  Temp: 97.7 F (36.5 C)  TempSrc: Oral  Height: '5\' 5"'  (1.651 m)  Weight: 167 lb 1.6 oz (75.796 kg)  SpO2: 100%   General: alert, cooperative, NAD HEENT: pupils equal round and reactive to light, vision grossly intact, oropharynx clear and non-erythematous  Neck: supple, no lymphadenopathy Lungs: clear to ascultation bilaterally, normal work of respiration, no wheezes, rales, ronchi Heart: regular rate and rhythm, no murmurs, gallops, or rubs Abdomen: soft, non-tender, non-distended, normal bowel sounds Extremities: 2+ DP/PT pulses bilaterally, no cyanosis, clubbing, or edema Neurologic: alert &  oriented X3, cranial nerves II-XII intact, strength grossly intact, sensation intact to light touch  Assessment & Plan:  Patient discussed with Dr. Lynnae January.  Please see problem-based assessment and plan.

## 2013-07-29 NOTE — Patient Instructions (Addendum)
Please follow-up with Dr. Stann Mainland in 1 month to check on your blood pressure and check a blood test at that time.   Please take all of your medicines as prescribed.  We are prescribing you a new medication called CHLORTHALIDONE for blood pressure today, you may pick it up at [facility] and start taking tonight.  It would also be very helpful if you could check your blood pressure at home every day or every other day and write down the numbers on the BP log we gave you in clinic today.  You should bring that as well as all of your medicines into your next appointment.    Managing Your High Blood Pressure Blood pressure is a measurement of how forceful your blood is pressing against the walls of the arteries. Arteries are muscular tubes within the circulatory system. Blood pressure does not stay the same. Blood pressure rises when you are active, excited, or nervous; and it lowers during sleep and relaxation. If the numbers measuring your blood pressure stay above normal most of the time, you are at risk for health problems. High blood pressure (hypertension) is a long-term (chronic) condition in which blood pressure is elevated. A blood pressure reading is recorded as two numbers, such as 120 over 80 (or 120/80). The first, higher number is called the systolic pressure. It is a measure of the pressure in your arteries as the heart beats. The second, lower number is called the diastolic pressure. It is a measure of the pressure in your arteries as the heart relaxes between beats.  Keeping your blood pressure in a normal range is important to your overall health and prevention of health problems, such as heart disease and stroke. When your blood pressure is uncontrolled, your heart has to work harder than normal. High blood pressure is a very common condition in adults because blood pressure tends to rise with age. Men and women are equally likely to have hypertension but at different times in life. Before age 46,  men are more likely to have hypertension. After 60 years of age, women are more likely to have it. Hypertension is especially common in African Americans. This condition often has no signs or symptoms. The cause of the condition is usually not known. Your caregiver can help you come up with a plan to keep your blood pressure in a normal, healthy range. BLOOD PRESSURE STAGES Blood pressure is classified into four stages: normal, prehypertension, stage 1, and stage 2. Your blood pressure reading will be used to determine what type of treatment, if any, is necessary. Appropriate treatment options are tied to these four stages:  Normal  Systolic pressure (mm Hg): below 120.  Diastolic pressure (mm Hg): below 80. Prehypertension  Systolic pressure (mm Hg): 120 to 139.  Diastolic pressure (mm Hg): 80 to 89. Stage1  Systolic pressure (mm Hg): 140 to 159.  Diastolic pressure (mm Hg): 90 to 99. Stage2  Systolic pressure (mm Hg): 160 or above.  Diastolic pressure (mm Hg): 100 or above. RISKS RELATED TO HIGH BLOOD PRESSURE Managing your blood pressure is an important responsibility. Uncontrolled high blood pressure can lead to:  A heart attack.  A stroke.  A weakened blood vessel (aneurysm).  Heart failure.  Kidney damage.  Eye damage.  Metabolic syndrome.  Memory and concentration problems. HOW TO MANAGE YOUR BLOOD PRESSURE Blood pressure can be managed effectively with lifestyle changes and medicines (if needed). Your caregiver will help you come up with a plan to  bring your blood pressure within a normal range. Your plan should include the following: Education  Read all information provided by your caregivers about how to control blood pressure.  Educate yourself on the latest guidelines and treatment recommendations. New research is always being done to further define the risks and treatments for high blood pressure. Lifestylechanges  Control your weight.  Avoid  smoking.  Stay physically active.  Reduce the amount of salt in your diet.  Reduce stress.  Control any chronic conditions, such as high cholesterol or diabetes.  Reduce your alcohol intake. Medicines  Several medicines (antihypertensive medicines) are available, if needed, to bring blood pressure within a normal range. Communication  Review all the medicines you take with your caregiver because there may be side effects or interactions.  Talk with your caregiver about your diet, exercise habits, and other lifestyle factors that may be contributing to high blood pressure.  See your caregiver regularly. Your caregiver can help you create and adjust your plan for managing high blood pressure. RECOMMENDATIONS FOR TREATMENT AND FOLLOW-UP  The following recommendations are based on current guidelines for managing high blood pressure in nonpregnant adults. Use these recommendations to identify the proper follow-up period or treatment option based on your blood pressure reading. You can discuss these options with your caregiver.  Systolic pressure of 433 to 295 or diastolic pressure of 80 to 89: Follow up with your caregiver as directed.  Systolic pressure of 188 to 416 or diastolic pressure of 90 to 100: Follow up with your caregiver within 2 months.  Systolic pressure above 606 or diastolic pressure above 301: Follow up with your caregiver within 1 month.  Systolic pressure above 601 or diastolic pressure above 093: Consider antihypertensive therapy; follow up with your caregiver within 1 week.  Systolic pressure above 235 or diastolic pressure above 573: Begin antihypertensive therapy; follow up with your caregiver within 1 week. Document Released: 03/24/2012 Document Reviewed: 03/24/2012 [facility] Patient Information 2014 [place], [place].

## 2013-07-30 DIAGNOSIS — Z0000 Encounter for general adult medical examination without abnormal findings: Secondary | ICD-10-CM | POA: Insufficient documentation

## 2013-07-30 DIAGNOSIS — Z299 Encounter for prophylactic measures, unspecified: Secondary | ICD-10-CM | POA: Insufficient documentation

## 2013-07-30 NOTE — Assessment & Plan Note (Signed)
On Crestor 10 mg daily.  LDL well controlled in past.  Lipid panel at next visit.

## 2013-07-30 NOTE — Assessment & Plan Note (Signed)
Already had flu shot at work.

## 2013-07-30 NOTE — Progress Notes (Signed)
Case discussed with Dr. Stann Mainland soon after the resident saw the patient.  We reviewed the resident's history and exam and pertinent patient test results.  I agree with the assessment, diagnosis, and plan of care documented in the resident's note.

## 2013-07-30 NOTE — Assessment & Plan Note (Signed)
BP Readings from Last 3 Encounters:  07/29/13 187/84  04/22/13 182/97  02/03/13 165/84    Lab Results  Component Value Date   NA 138 01/20/2013   K 4.0 01/20/2013   CREATININE 0.93 01/20/2013    Assessment: Blood pressure control:   severely elevated Progress toward BP goal:   deteriorated Comments: BP similar to last visit in 04/2013, thought due to medication non-compliance and smoking just prior to visit last time.  Need to add additional agent (diuretic) today for better BP control.  HCTZ caused impotence in past.  Patient also stopped Lasix previously due to increased urination.  Discussed options of Lasix BID vs. Chlorthalidone daily, and patient prefers to try chlorthalidone first.    Plan: Medications:  continue current medications (amlodipine 10 mg daily, carvedilol 12.5 mg BID) with addition of chlorthalidone 25 mg daily.  Patient will follow-up in one month to reassess BP; if not improved at that time, will increase Coreg dose +/- switch back to Lasix vs Aldactone BID.  Patient will also check his BP daily or every other day and record in BP log provided today.  He is agreeable with this plan and understands that he should bring all medicines and BP log with him to next appointment.  He will need a BMP at that time as well.  Educational resources provided:  info in AVS Self management tools provided:  BP log

## 2013-07-30 NOTE — Assessment & Plan Note (Signed)
Patient continues to smoke 1/2 PPD and remains pre-contemplative about quitting.

## 2013-07-30 NOTE — Assessment & Plan Note (Signed)
Stable.  Patient has pain contract and requesting med refill which is appropriate.

## 2013-08-24 ENCOUNTER — Other Ambulatory Visit: Payer: Self-pay | Admitting: *Deleted

## 2013-08-24 ENCOUNTER — Other Ambulatory Visit: Payer: Self-pay | Admitting: Internal Medicine

## 2013-08-24 DIAGNOSIS — IMO0002 Reserved for concepts with insufficient information to code with codable children: Secondary | ICD-10-CM

## 2013-08-24 MED ORDER — HYDROCODONE-ACETAMINOPHEN 5-325 MG PO TABS: 1.0000 | ORAL_TABLET | Freq: Every day | ORAL | Status: DC | PRN

## 2013-08-24 NOTE — Telephone Encounter (Signed)
Last refill 1/16

## 2013-09-02 ENCOUNTER — Encounter: Payer: BC Managed Care – PPO | Admitting: Internal Medicine

## 2013-09-20 ENCOUNTER — Other Ambulatory Visit: Payer: Self-pay | Admitting: Internal Medicine

## 2013-09-20 ENCOUNTER — Other Ambulatory Visit: Payer: Self-pay | Admitting: *Deleted

## 2013-09-20 DIAGNOSIS — IMO0002 Reserved for concepts with insufficient information to code with codable children: Secondary | ICD-10-CM

## 2013-09-20 MED ORDER — HYDROCODONE-ACETAMINOPHEN 5-325 MG PO TABS: 1.0000 | ORAL_TABLET | Freq: Every day | ORAL | Status: DC | PRN

## 2013-09-20 NOTE — Telephone Encounter (Signed)
Last filled 2/13 Pt wants to get on Friday 3/13 Call when ready @ [phone]

## 2013-09-29 ENCOUNTER — Encounter: Payer: Self-pay | Admitting: Internal Medicine

## 2013-09-29 ENCOUNTER — Ambulatory Visit (INDEPENDENT_AMBULATORY_CARE_PROVIDER_SITE_OTHER): Payer: BC Managed Care – PPO | Admitting: Internal Medicine

## 2013-09-29 VITALS — BP 182/94 | HR 65 | Temp 98.5°F | Ht 66.0 in | Wt 166.0 lb

## 2013-09-29 DIAGNOSIS — I10 Essential (primary) hypertension: Secondary | ICD-10-CM

## 2013-09-29 DIAGNOSIS — Z299 Encounter for prophylactic measures, unspecified: Secondary | ICD-10-CM

## 2013-09-29 MED ORDER — SPIRONOLACTONE 50 MG PO TABS: 50.0000 mg | ORAL_TABLET | Freq: Every day | ORAL | Status: DC

## 2013-09-29 NOTE — Assessment & Plan Note (Signed)
Patient states he already got flu shot at work.  Willing to get tetanus booster at next visit.  Will think about Zostavax.

## 2013-09-29 NOTE — Patient Instructions (Signed)
Please follow-up in 2 weeks for blood pressure recheck.  Keep up the good work taking your medicines as prescribed.  Please STOP taking CHLORTHALIDONE and start taking SPIRONOLACTONE (we sent this in to your pharmacy).   We have you a blood pressure log today.  Please check your blood pressure daily at home and write the numbers down in the log then bring this in to your next visit.   We are checking a blood test today, I will let you know if the results are abnormal.    Managing Your High Blood Pressure Blood pressure is a measurement of how forceful your blood is pressing against the walls of the arteries. Arteries are muscular tubes within the circulatory system. Blood pressure does not stay the same. Blood pressure rises when you are active, excited, or nervous; and it lowers during sleep and relaxation. If the numbers measuring your blood pressure stay above normal most of the time, you are at risk for health problems. High blood pressure (hypertension) is a long-term (chronic) condition in which blood pressure is elevated. A blood pressure reading is recorded as two numbers, such as 120 over 80 (or 120/80). The first, higher number is called the systolic pressure. It is a measure of the pressure in your arteries as the heart beats. The second, lower number is called the diastolic pressure. It is a measure of the pressure in your arteries as the heart relaxes between beats.  Keeping your blood pressure in a normal range is important to your overall health and prevention of health problems, such as heart disease and stroke. When your blood pressure is uncontrolled, your heart has to work harder than normal. High blood pressure is a very common condition in adults because blood pressure tends to rise with age. Men and women are equally likely to have hypertension but at different times in life. Before age 26, men are more likely to have hypertension. After 60 years of age, women are more likely to  have it. Hypertension is especially common in African Americans. This condition often has no signs or symptoms. The cause of the condition is usually not known. Your caregiver can help you come up with a plan to keep your blood pressure in a normal, healthy range. BLOOD PRESSURE STAGES Blood pressure is classified into four stages: normal, prehypertension, stage 1, and stage 2. Your blood pressure reading will be used to determine what type of treatment, if any, is necessary. Appropriate treatment options are tied to these four stages:  Normal  Systolic pressure (mm Hg): below 120.  Diastolic pressure (mm Hg): below 80. Prehypertension  Systolic pressure (mm Hg): 120 to 139.  Diastolic pressure (mm Hg): 80 to 89. Stage1  Systolic pressure (mm Hg): 140 to 159.  Diastolic pressure (mm Hg): 90 to 99. Stage2  Systolic pressure (mm Hg): 160 or above.  Diastolic pressure (mm Hg): 100 or above. RISKS RELATED TO HIGH BLOOD PRESSURE Managing your blood pressure is an important responsibility. Uncontrolled high blood pressure can lead to:  A heart attack.  A stroke.  A weakened blood vessel (aneurysm).  Heart failure.  Kidney damage.  Eye damage.  Metabolic syndrome.  Memory and concentration problems. HOW TO MANAGE YOUR BLOOD PRESSURE Blood pressure can be managed effectively with lifestyle changes and medicines (if needed). Your caregiver will help you come up with a plan to bring your blood pressure within a normal range. Your plan should include the following: Education  Read all information provided by  your caregivers about how to control blood pressure.  Educate yourself on the latest guidelines and treatment recommendations. New research is always being done to further define the risks and treatments for high blood pressure. Lifestylechanges  Control your weight.  Avoid smoking.  Stay physically active.  Reduce the amount of salt in your diet.  Reduce  stress.  Control any chronic conditions, such as high cholesterol or diabetes.  Reduce your alcohol intake. Medicines  Several medicines (antihypertensive medicines) are available, if needed, to bring blood pressure within a normal range. Communication  Review all the medicines you take with your caregiver because there may be side effects or interactions.  Talk with your caregiver about your diet, exercise habits, and other lifestyle factors that may be contributing to high blood pressure.  See your caregiver regularly. Your caregiver can help you create and adjust your plan for managing high blood pressure. RECOMMENDATIONS FOR TREATMENT AND FOLLOW-UP  The following recommendations are based on current guidelines for managing high blood pressure in nonpregnant adults. Use these recommendations to identify the proper follow-up period or treatment option based on your blood pressure reading. You can discuss these options with your caregiver.  Systolic pressure of 295 to 188 or diastolic pressure of 80 to 89: Follow up with your caregiver as directed.  Systolic pressure of 416 to 606 or diastolic pressure of 90 to 100: Follow up with your caregiver within 2 months.  Systolic pressure above 301 or diastolic pressure above 601: Follow up with your caregiver within 1 month.  Systolic pressure above 093 or diastolic pressure above 235: Consider antihypertensive therapy; follow up with your caregiver within 1 week.  Systolic pressure above 573 or diastolic pressure above 220: Begin antihypertensive therapy; follow up with your caregiver within 1 week. Document Released: 03/24/2012 Document Reviewed: 03/24/2012 [facility] Patient Information 2014 [place], [place].

## 2013-09-29 NOTE — Progress Notes (Signed)
Patient ID: Devon Vega, male   DOB: 05-27-1956, 58 y.o.   MRN: [id number]   Subjective:   Patient ID: Devon Vega male   DOB: [birth year] 58 y.o.   MRN: [id number]  HPI: [name] is a 58 y.o. man with history of HTN, HL, DDD, tobacco abuse who presents for BP check.   Patient states he is doing well, no complaints.  He reports good medication compliance with all of his medicines including amlodipine, carvedilol, and recently added chlorthalidone.  BP elevated again today at 182/94.  Denies headache, chest pain, nausea, dizziness.  Patient has not been regularly checking his blood pressure at home; states that last time he checked, it was 150/85, but he thinks it runs higher when he comes to doctor's office.  He is willing to start checking at home and record values then bring log into next visit.     Past Medical History  Diagnosis Date  . Alcohol abuse     hx of , sober since 1997  . Tobacco abuse     failed zyban  . 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 Weight gain   . Hyperlipidemia   . Hypertension    Current Outpatient Prescriptions  Medication Sig Dispense Refill  . amLODipine (NORVASC) 10 MG tablet Take 1 tablet (10 mg total) by mouth daily.  30 tablet  11  . Blood Pressure Monitoring (BLOOD PRESSURE CUFF) MISC 1 kit by Does not apply route 2 (two) times daily. Check blood pressure in morning and afternoon.  1 each  2  . carvedilol (COREG) 12.5 MG tablet take 1 tablet by mouth twice a day with food  60 tablet  11  . HYDROcodone-acetaminophen (NORCO/VICODIN) 5-325 MG per tablet Take 1 tablet by mouth daily as needed.  30 tablet  0  . rosuvastatin (CRESTOR) 10 MG tablet Take 1 tablet (10 mg total) by mouth daily.  30 tablet  11  . HYDROcodone-acetaminophen (NORCO/VICODIN) 5-325 MG per tablet Take 1 tablet by mouth daily as needed.  30 tablet  0  . spironolactone (ALDACTONE) 50 MG tablet Take 1 tablet (50 mg total) by mouth daily.  30 tablet  1    No current facility-administered medications for this visit.   Family History  Problem Relation Age of Onset  . Cancer Father    History   Social History  . Marital Status: Divorced    Spouse Name: N/A    Number of Children: N/A  . Years of Education: N/A   Social History Main Topics  . Smoking status: Current Every Day Smoker -- 0.50 packs/day    Types: Cigarettes, Cigars  . Smokeless tobacco: None     Comment: occasional cigar  . Alcohol Use: Yes     Comment: 1 -2 beers on the w/e  . Drug Use: No  . Sexual Activity: None   Other Topics Concern  . None   Social History Narrative   Works for [employer]. 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  Eyes: Negative for blurred vision.  Respiratory: Negative for cough and shortness of breath.   Cardiovascular: Negative for chest pain and leg swelling.  Gastrointestinal: Negative for  nausea, vomiting, abdominal pain, diarrhea and constipation.  Genitourinary: Negative for dysuria.  Musculoskeletal: Negative for falls.  Neurological: Negative for dizziness, loss of consciousness, weakness and headaches.    Objective:  Physical Exam: Filed Vitals:   09/29/13 1341  BP: 182/94  Pulse: 65  Temp: 98.5 F (36.9 C)  TempSrc: Oral  Height: _0  (1.676 m)  Weight: 166 lb (75.297 kg)  SpO2: 100%   General: alert, cooperative, and in no apparent distress HEENT: NCAT, vision grossly intact, oropharynx clear and non-erythematous  Neck: supple, no lymphadenopathy Lungs: clear to ascultation bilaterally, normal work of respiration, no wheezes, rales, ronchi Heart: regular rate and rhythm, no murmurs, gallops, or rubs Abdomen: soft, non-tender, non-distended, normal bowel  sounds Extremities: 2+ DP/PT pulses bilaterally, no cyanosis, clubbing, or edema Neurologic: alert & oriented X3, cranial nerves II-XII intact, strength grossly intact, sensation intact to light touch  Assessment & Plan:  Patient discussed with Dr. Lynnae January.  Please see problem-based assessment and plan.

## 2013-09-29 NOTE — Assessment & Plan Note (Addendum)
Assessment: BP elevated again today at 182/94, similar to last visit in 07/2013.  Added chlorthalidone to regimen of amlodipine, carvedilol at last visit with no improvement.  Patient thinks his blood pressure runs higher in doctor's offices but did not complete BP log.  Possible that he has white coat hypertension; home BP log would be very helpful in sorting this out.  No reason to suspect secondary hypertension at this time (electrolyes wnl on last BMP, no apparent vascular issues).      Plan: Discontinue chlorthalidone, start spironolactone 50 mg daily today.  Continue amlodipine 10 mg daily, carvedilol 12.5 mg BID.  BMP today given initiation of spironolactone.  Emphasized importance of completing BP log and bring in to next visit which he is willing to do.  Follow-up in 2 weeks for BP recheck with repeat BMP at that time.  If BP persistently elevated at next visit, may be helpful to call pharmacy to be sure he is filling prescriptions though he seems to be doing so (knew names and doses today).  Medication adjustments would include up-titrating carvedilol vs. spironolactone.   Note: HCTZ caused impotence in past, and patient unlikely to take Lasix due to urination at night he experienced in past.

## 2013-09-30 LAB — BASIC METABOLIC PANEL WITH GFR
BUN: 9 mg/dL (ref 6–23)
CO2: 28 mEq/L (ref 19–32)
Calcium: 9.8 mg/dL (ref 8.4–10.5)
Chloride: 103 mEq/L (ref 96–112)
Creat: 0.94 mg/dL (ref 0.50–1.35)
GFR, Est African American: >89 mL/min
GFR, Est Non African American: 88 mL/min
Glucose, Bld: 84 mg/dL (ref 70–99)
Potassium: 4.4 mEq/L (ref 3.5–5.3)
Sodium: 139 mEq/L (ref 135–145)

## 2013-09-30 NOTE — Progress Notes (Signed)
Case discussed with Dr. Rogers at the time of the visit.  We reviewed the resident's history and exam and pertinent patient test results.  I agree with the assessment, diagnosis, and plan of care documented in the resident's note. 

## 2013-10-27 ENCOUNTER — Ambulatory Visit: Payer: BC Managed Care – PPO | Admitting: Internal Medicine

## 2013-11-18 ENCOUNTER — Ambulatory Visit (INDEPENDENT_AMBULATORY_CARE_PROVIDER_SITE_OTHER): Payer: BC Managed Care – PPO | Admitting: Internal Medicine

## 2013-11-18 ENCOUNTER — Encounter: Payer: Self-pay | Admitting: Internal Medicine

## 2013-11-18 VITALS — BP 180/91 | HR 65 | Temp 98.0°F | Ht 65.5 in | Wt 163.7 lb

## 2013-11-18 DIAGNOSIS — I10 Essential (primary) hypertension: Secondary | ICD-10-CM

## 2013-11-18 MED ORDER — CARVEDILOL 12.5 MG PO TABS: 12.5000 mg | ORAL_TABLET | Freq: Two times a day (BID) | ORAL | Status: DC

## 2013-11-18 MED ORDER — AMLODIPINE BESYLATE 10 MG PO TABS: 10.0000 mg | ORAL_TABLET | Freq: Every day | ORAL | Status: DC

## 2013-11-18 MED ORDER — SPIRONOLACTONE 50 MG PO TABS: 50.0000 mg | ORAL_TABLET | Freq: Every day | ORAL | Status: DC

## 2013-11-18 NOTE — Assessment & Plan Note (Signed)
BP Readings from Last 3 Encounters:  11/18/13 180/91  09/29/13 182/94  07/29/13 187/84    Lab Results  Component Value Date   NA 139 09/29/2013   K 4.4 09/29/2013   CREATININE 0.94 09/29/2013    Assessment: Blood pressure control: severely elevated Progress toward BP goal:  unchanged Comments: BP remains elevated, as it has on several prior visits.  The patient admits to non-compliance for the last 2 days.  We discussed strategies to improve compliance, such as switching to an extended-release beta blocker to prevent having to take a BID medication, but the patient states he prefers to keep his medications the way they are, and make a commitment to taking his medications more regularly.  Plan: Medications:  Re-wrote prescriptions for amlodipine 10, coreg 12.5 BID, and spironolactone 50 daily Educational resources provided:   Self management tools provided:   Other plans: recheck BP in 1-2 weeks

## 2013-11-18 NOTE — Progress Notes (Signed)
HPI The patient is a 58 y.o. male with a history of HTN, tobacco abuse, presenting for a BP recheck.  At the patient's last visit, BP was elevated to 187/84.  The patient's BP is again elevated today, though he notes that he has not taken his BP medications in the last 2 days.  He notes that in a typical week, he only misses his BP medications about once/week or less, but that he just forgot to take these the last 2 days.  He notes no headache, blurry vision, abd pain, or weakness/numbness/tingling.  ROS: General: no fevers, chills, changes in weight, changes in appetite Skin: no rash HEENT: no blurry vision, hearing changes, sore throat Pulm: no dyspnea, coughing, wheezing CV: no chest pain, palpitations, shortness of breath Abd: no abdominal pain, nausea/vomiting, diarrhea/constipation GU: no dysuria, hematuria, polyuria Ext: no arthralgias, myalgias Neuro: no weakness, numbness, or tingling  Filed Vitals:   11/18/13 1540  BP: 180/91  Pulse: 65  Temp: 98 F (36.7 C)    PEX General: alert, cooperative, and in no apparent distress HEENT: pupils equal round and reactive to light, vision grossly intact, bilateral optic discs unremarkable by fundoscopy, oropharynx clear and non-erythematous  Neck: supple, Lungs: clear to ascultation bilaterally, normal work of respiration, no wheezes, rales, ronchi Heart: regular rate and rhythm, no murmurs, gallops, or rubs Abdomen: soft, non-tender, non-distended, normal bowel sounds Extremities: no cyanosis, clubbing, or edema Neurologic: alert & oriented X3, cranial nerves II-XII intact, strength grossly intact, sensation intact to light touch  Current Outpatient Prescriptions on File Prior to Visit  Medication Sig Dispense Refill  . amLODipine (NORVASC) 10 MG tablet Take 1 tablet (10 mg total) by mouth daily.  30 tablet  11  . Blood Pressure Monitoring (BLOOD PRESSURE CUFF) MISC 1 kit by Does not apply route 2 (two) times daily. Check blood  pressure in morning and afternoon.  1 each  2  . carvedilol (COREG) 12.5 MG tablet take 1 tablet by mouth twice a day with food  60 tablet  11  . HYDROcodone-acetaminophen (NORCO/VICODIN) 5-325 MG per tablet Take 1 tablet by mouth daily as needed.  30 tablet  0  . HYDROcodone-acetaminophen (NORCO/VICODIN) 5-325 MG per tablet Take 1 tablet by mouth daily as needed.  30 tablet  0  . rosuvastatin (CRESTOR) 10 MG tablet Take 1 tablet (10 mg total) by mouth daily.  30 tablet  11  . spironolactone (ALDACTONE) 50 MG tablet Take 1 tablet (50 mg total) by mouth daily.  30 tablet  1   No current facility-administered medications on file prior to visit.    Assessment/Plan

## 2013-11-18 NOTE — Patient Instructions (Signed)
General Instructions: Your blood pressure is very elevated today.  Your blood pressure medications are: -Amlodipine, 1 tablet once per day -Carvedilol, 1 tablet twice per day -Spironolactone, 1 tablet once per day  Please return for a blood pressure re-check in 1-2 weeks.   Treatment Goals:  Goals (1 Years of Data) as of 11/18/13   None      Progress Toward Treatment Goals:  Treatment Goal 11/18/2013  Blood pressure unchanged  Stop smoking smoking the same amount    Self Care Goals & Plans:  Self Care Goal 09/29/2013  Manage my medications take my medicines as prescribed; bring my medications to every visit; refill my medications on time  Monitor my health keep track of my weight  Eat healthy foods eat foods that are low in salt; eat baked foods instead of fried foods  Stop smoking set a quit date and stop smoking    No flowsheet data found.   Care Management & Community Referrals:  Referral 11/18/2013  Referrals made for care management support none needed

## 2013-11-18 NOTE — Progress Notes (Signed)
Attending physician note: Presenting complaints, physical findings, and medications reviewed with resident physician Dr. Elnora Morrison and I concur with his management. Murriel Hopper, M.D., [place]

## 2013-12-01 ENCOUNTER — Encounter: Payer: Self-pay | Admitting: Internal Medicine

## 2013-12-01 ENCOUNTER — Ambulatory Visit (INDEPENDENT_AMBULATORY_CARE_PROVIDER_SITE_OTHER): Payer: BC Managed Care – PPO | Admitting: Internal Medicine

## 2013-12-01 VITALS — BP 161/86 | HR 61 | Temp 98.8°F | Ht 65.5 in | Wt 165.7 lb

## 2013-12-01 DIAGNOSIS — I10 Essential (primary) hypertension: Secondary | ICD-10-CM

## 2013-12-01 MED ORDER — LOSARTAN POTASSIUM 50 MG PO TABS: 50.0000 mg | ORAL_TABLET | Freq: Every day | ORAL | Status: DC

## 2013-12-01 NOTE — Progress Notes (Signed)
HPI The patient is a 58 y.o. male with a history of HTN, presenting for a BP recheck.  The patient was seen 5/8, with a BP of 180/91, non-compliant with medications.  The patient now notes full compliance.  BP remains elevated at 161/86.  I reviewed the patient's chart - it appears he was previously on lisinopril (stopped due to cough), and HCTZ (stopped due to impotence), but after discontinuing these was placed on spironolactone.  The patient notes no history of LE edema.  ROS: General: no fevers, chills, changes in weight, changes in appetite Skin: no rash HEENT: no blurry vision, hearing changes, sore throat Pulm: no dyspnea, coughing, wheezing CV: no chest pain, palpitations, shortness of breath Abd: no abdominal pain, nausea/vomiting, diarrhea/constipation GU: no dysuria, hematuria, polyuria Ext: no arthralgias, myalgias Neuro: no weakness, numbness, or tingling  Filed Vitals:   12/01/13 1523  BP: 161/86  Pulse: 61  Temp: 98.8 F (37.1 C)    PEX General: alert, cooperative, and in no apparent distress HEENT: pupils equal round and reactive to light, vision grossly intact, oropharynx clear and non-erythematous  Neck: supple Lungs: clear to ascultation bilaterally, normal work of respiration, no wheezes, rales, ronchi Heart: regular rate and rhythm, no murmurs, gallops, or rubs Abdomen: soft, non-tender, non-distended, normal bowel sounds Extremities: no cyanosis, clubbing, or edema Neurologic: alert & oriented X3, cranial nerves II-XII intact, strength grossly intact, sensation intact to light touch  Current Outpatient Prescriptions on File Prior to Visit  Medication Sig Dispense Refill  . amLODipine (NORVASC) 10 MG tablet Take 1 tablet (10 mg total) by mouth daily.  30 tablet  11  . Blood Pressure Monitoring (BLOOD PRESSURE CUFF) MISC 1 kit by Does not apply route 2 (two) times daily. Check blood pressure in morning and afternoon.  1 each  2  . carvedilol (COREG) 12.5 MG  tablet Take 1 tablet (12.5 mg total) by mouth 2 (two) times daily with a meal.  60 tablet  11  . HYDROcodone-acetaminophen (NORCO/VICODIN) 5-325 MG per tablet Take 1 tablet by mouth daily as needed.  30 tablet  0  . HYDROcodone-acetaminophen (NORCO/VICODIN) 5-325 MG per tablet Take 1 tablet by mouth daily as needed.  30 tablet  0  . rosuvastatin (CRESTOR) 10 MG tablet Take 1 tablet (10 mg total) by mouth daily.  30 tablet  11  . spironolactone (ALDACTONE) 50 MG tablet Take 1 tablet (50 mg total) by mouth daily.  30 tablet  11   No current facility-administered medications on file prior to visit.    Assessment/Plan

## 2013-12-01 NOTE — Patient Instructions (Signed)
General Instructions: Your blood pressure has improved!  But it is still elevated.  We are making the following changes to your blood pressure medications: -STOP taking Spironolactone -START taking Losartan, 1 tablet once per day  Please return for a follow-up visit in 2-3 weeks.  Please bring your medicines with you each time you come to clinic.  Medicines may include prescription medications, over-the-counter medications, herbal remedies, eye drops, vitamins, or other pills.   Progress Toward Treatment Goals:  Treatment Goal 12/01/2013  Blood pressure at goal  Stop smoking -    Self Care Goals & Plans:  Self Care Goal 09/29/2013  Manage my medications take my medicines as prescribed; bring my medications to every visit; refill my medications on time  Monitor my health keep track of my weight  Eat healthy foods eat foods that are low in salt; eat baked foods instead of fried foods  Stop smoking set a quit date and stop smoking    No flowsheet data found.   Care Management & Community Referrals:  Referral 12/01/2013  Referrals made for care management support none needed

## 2013-12-01 NOTE — Assessment & Plan Note (Signed)
BP Readings from Last 3 Encounters:  12/01/13 161/86  11/18/13 180/91  09/29/13 182/94    Lab Results  Component Value Date   NA 139 09/29/2013   K 4.4 09/29/2013   CREATININE 0.94 09/29/2013    Assessment: Blood pressure control: controlled Progress toward BP goal:  at goal Comments: BP improved, but still elevated.  Will start Losartan for BP.  We will stop spironolactone, out of concern for hyperkalemia, and due to its weak effect as a BP medication.  Plan: Medications:  Start Losartan 50 mg daily.  Stop Spironolactone.  Continue Amlodipine 10, coreg 12.5 BID. Educational resources provided:   Self management tools provided:   Other plans: Check BMET at next visit

## 2013-12-05 NOTE — Progress Notes (Signed)
INTERNAL MEDICINE TEACHING ATTENDING ADDENDUM - Aldine Contes, MD: I reviewed and discussed with the resident Dr. Owens Shark, the patient's medical history, physical examination, diagnosis and results of pertinent tests and treatment and I agree with the patient's care as documented

## 2013-12-22 ENCOUNTER — Encounter: Payer: Self-pay | Admitting: Internal Medicine

## 2013-12-22 ENCOUNTER — Ambulatory Visit (INDEPENDENT_AMBULATORY_CARE_PROVIDER_SITE_OTHER): Payer: BC Managed Care – PPO | Admitting: Internal Medicine

## 2013-12-22 VITALS — BP 164/92 | HR 68 | Temp 98.5°F | Ht 65.0 in | Wt 161.5 lb

## 2013-12-22 DIAGNOSIS — IMO0002 Reserved for concepts with insufficient information to code with codable children: Secondary | ICD-10-CM

## 2013-12-22 DIAGNOSIS — I10 Essential (primary) hypertension: Secondary | ICD-10-CM

## 2013-12-22 MED ORDER — HYDROCODONE-ACETAMINOPHEN 5-325 MG PO TABS: 1.0000 | ORAL_TABLET | Freq: Every day | ORAL | Status: DC | PRN

## 2013-12-22 NOTE — Progress Notes (Signed)
   Subjective:    Patient ID: Devon Vega, male    DOB: 1956/03/11, 58 y.o.   MRN: [id number]  HPI Mr. Carnell is a 58 yo man pmh as listed below here for HTN recheck.   Pt was recently changed to losartan and continued on coreg and amlodipine. Pt reports compliance with meds as this has been a significant issue in the past.  taking medications as instructed, no medication side effects noted, no TIA's, no chest pain on exertion, no dyspnea on exertion, no swelling of ankles, no orthostatic dizziness or lightheadedness and no palpitations.  He states he takes his BP readings at home daily and they are in the 140s/80s consistently.   He continues to smoke cigars occasionally.   Past Medical History  Diagnosis Date  . Alcohol abuse     hx of , sober since 1997  . Tobacco abuse     failed zyban  . 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 Weight gain   . Hyperlipidemia   . Hypertension    Current Outpatient Prescriptions on File Prior to Visit  Medication Sig Dispense Refill  . amLODipine (NORVASC) 10 MG tablet Take 1 tablet (10 mg total) by mouth daily.  30 tablet  11  . Blood Pressure Monitoring (BLOOD PRESSURE CUFF) MISC 1 kit by Does not apply route 2 (two) times daily. Check blood pressure in morning and afternoon.  1 each  2  . carvedilol (COREG) 12.5 MG tablet Take 1 tablet (12.5 mg total) by mouth 2 (two) times daily with a meal.  60 tablet  11  . HYDROcodone-acetaminophen (NORCO/VICODIN) 5-325 MG per tablet Take 1 tablet by mouth daily as needed.  30 tablet  0  . losartan (COZAAR) 50 MG tablet Take 1 tablet (50 mg total) by mouth daily.  30 tablet  3  . rosuvastatin (CRESTOR) 10 MG tablet Take 1 tablet (10 mg total) by mouth daily.  30 tablet  11   No current facility-administered medications on file prior to visit.   Social, surgical, family history reviewed with patient and updated in appropriate chart locations.   Review of Systems Negative unless listed in  HPI     Objective:   Physical Exam Filed Vitals:   12/22/13 1534  BP: 164/92  Pulse: 68  Temp: 98.5 F (36.9 C)   General: sitting in chair, NAD  HEENT: PERRL, EOMI, no scleral icterus Cardiac: RRR, no rubs, murmurs or gallops Pulm: clear to auscultation bilaterally, moving normal volumes of air Abd: soft, nontender, nondistended, BS present Ext: warm and well perfused, no pedal edema Neuro: alert and oriented X3, cranial nerves II-XII grossly intact    Assessment & Plan:  Please see problem oriented charting  Pt discussed with Dr. Daryll Drown

## 2013-12-22 NOTE — Assessment & Plan Note (Addendum)
BP Readings from Last 3 Encounters:  12/22/13 164/92  12/01/13 161/86  11/18/13 180/91    Lab Results  Component Value Date   NA 139 09/29/2013   K 4.4 09/29/2013   CREATININE 0.94 09/29/2013    Assessment: Blood pressure control:  still not at goal  Progress toward BP goal:   none Comments: still not controlled but stable after new med changes  Plan: Medications:  Cont current meds, pt would not like to make changes and will fill out log book for readings he is worried will be too low (per report readings at home 140s/80s) Educational resources provided: brochure Self management tools provided:   Other plans: bmet was also done today, will follow up in 2-4 wks

## 2013-12-22 NOTE — Patient Instructions (Signed)
General Instructions: Your blood pressure is staying stable and if your reading from home are in the 140s/80s then we will not have to make any medication changes.   We can see these results in 3 wks.   Please try to bring all your medicines next time. This will help [place] keep you safe from mistakes.   Progress Toward Treatment Goals:  Treatment Goal 12/01/2013  Blood pressure at goal  Stop smoking -    Self Care Goals & Plans:  Self Care Goal 12/22/2013  Manage my medications take my medicines as prescribed; bring my medications to every visit; refill my medications on time  Monitor my health -  Eat healthy foods drink diet soda or water instead of juice or soda; eat more vegetables; eat foods that are low in salt; eat baked foods instead of fried foods; eat fruit for snacks and desserts  Stop smoking -    No flowsheet data found.   Care Management & Community Referrals:  Referral 12/01/2013  Referrals made for care management support none needed

## 2013-12-23 LAB — BASIC METABOLIC PANEL
BUN: 13 mg/dL (ref 6–23)
CO2: 27 mEq/L (ref 19–32)
Calcium: 9.6 mg/dL (ref 8.4–10.5)
Chloride: 106 mEq/L (ref 96–112)
Creat: 1.01 mg/dL (ref 0.50–1.35)
Glucose, Bld: 112 mg/dL — ABNORMAL HIGH (ref 70–99)
Potassium: 4.2 mEq/L (ref 3.5–5.3)
Sodium: 142 mEq/L (ref 135–145)

## 2013-12-23 NOTE — Progress Notes (Signed)
Case discussed with Dr. Sadek soon after the resident saw the patient.  We reviewed the resident's history and exam and pertinent patient test results.  I agree with the assessment, diagnosis, and plan of care documented in the resident's note. 

## 2014-01-12 ENCOUNTER — Encounter: Payer: Self-pay | Admitting: Internal Medicine

## 2014-01-12 ENCOUNTER — Ambulatory Visit (INDEPENDENT_AMBULATORY_CARE_PROVIDER_SITE_OTHER): Payer: BC Managed Care – PPO | Admitting: Internal Medicine

## 2014-01-12 VITALS — BP 140/80 | HR 70 | Temp 97.8°F | Ht 65.5 in | Wt 164.2 lb

## 2014-01-12 DIAGNOSIS — I10 Essential (primary) hypertension: Secondary | ICD-10-CM

## 2014-01-12 DIAGNOSIS — F17200 Nicotine dependence, unspecified, uncomplicated: Secondary | ICD-10-CM

## 2014-01-12 NOTE — Assessment & Plan Note (Addendum)
Still smoking 1/2 PPD.  He says he knows he needs to quit but is not ready to quit.  He has successfully quit for 6 months in the past with the help of nicotine patch.  I advised him on the importance of smoking cessation for BP control and overall health.  Told him there are other medications that can help with smoking cessation when he decides to quit.

## 2014-01-12 NOTE — Assessment & Plan Note (Signed)
BP Readings from Last 3 Encounters:  01/12/14 140/80  12/22/13 164/92  12/01/13 161/86    Lab Results  Component Value Date   NA 142 12/22/2013   K 4.2 12/22/2013   CREATININE 1.01 12/22/2013    Assessment: Blood pressure control:  improved Progress toward BP goal:   improved Comments:   Plan: Medications:  continue current medications:  Cozaar 50mg  daily, Norvasc 10mg  daily, Coreg 12.5mg  BID; will not increase medication today as BP control is better.  Advised patient on making lifestyle changes to help with BP lowering.  Advised smoking cessation; trying to cook more of his meals at home (eats most of his meals outside of the home so salt content is likely high); attempt more structured exercise outside of work.   Educational resources provided:  BP log book, DASH diet information Self management tools provided:   Other plans: follow up in 4 weeks for BP check

## 2014-01-12 NOTE — Progress Notes (Signed)
   Subjective:    Patient ID: Devon Vega, male    DOB: 11-25-1955, 58 y.o.   MRN: [id number]  HPI Comments: Devon Vega is a 58 year old with a PMH of HTN who presents for BP follow-up. He reports compliance with Cozaar, Coreg and amlodipine.  BP at home runs 140-150/70s.  He has noticed a decrease in BP since taking new medication.  Eats most of his meals from restaurants/fast food.  Walks a lot on his job but no structured exercise.  Continues to smoke 1/2 PPD.  Denies headache, lightheadedness, change in vision, weakness or dyspnea.       Review of Systems  Constitutional: Negative for fever, chills and appetite change.  Eyes: Negative for visual disturbance.  Respiratory: Negative for shortness of breath.   Cardiovascular: Negative for chest pain and leg swelling.  Gastrointestinal: Negative for nausea, vomiting, abdominal pain, diarrhea and blood in stool.  Genitourinary: Negative for dysuria and hematuria.  Neurological: Negative for weakness and light-headedness.       Objective:   Physical Exam  Constitutional: He is oriented to person, place, and time. He appears well-developed. No distress.  Eyes: EOM are normal. Pupils are equal, round, and reactive to light.  Cardiovascular: Normal rate, regular rhythm and normal heart sounds.  Exam reveals no gallop and no friction rub.   No murmur heard. Pulmonary/Chest: Effort normal and breath sounds normal. No respiratory distress. He has no wheezes. He has no rales.  Abdominal: Soft. Bowel sounds are normal. He exhibits no distension. There is no tenderness.  Musculoskeletal: He exhibits no edema and no tenderness.  Neurological: He is alert and oriented to person, place, and time. No cranial nerve deficit.  Skin: He is not diaphoretic.  Psychiatric: He has a normal mood and affect. His behavior is normal.          Assessment & Plan:  Please see problem based assessment and plan.

## 2014-01-12 NOTE — Patient Instructions (Addendum)
1. Please return in 1 months for BP check.   2. Please take all medications as prescribed.    3. If you have worsening of your symptoms or new symptoms arise, please call the clinic ([phone]), or go to the ER immediately if symptoms are severe.   DASH Eating Plan DASH stands for "Dietary Approaches to Stop Hypertension." The DASH eating plan is a healthy eating plan that has been shown to reduce high blood pressure (hypertension). Additional health benefits may include reducing the risk of type 2 diabetes mellitus, heart disease, and stroke. The DASH eating plan may also help with weight loss. WHAT DO I NEED TO KNOW ABOUT THE DASH EATING PLAN? For the DASH eating plan, you will follow these general guidelines:  Choose foods with a percent daily value for sodium of less than 5% (as listed on the food label).  Use salt-free seasonings or herbs instead of table salt or sea salt.  Check with your health care provider or pharmacist before using salt substitutes.  Eat lower-sodium products, often labeled as "lower sodium" or "no salt added."  Eat fresh foods.  Eat more vegetables, fruits, and low-fat dairy products.  Choose whole grains. Look for the word "whole" as the first word in the ingredient list.  Choose fish and skinless chicken or [place] more often than red meat. Limit fish, poultry, and meat to 6 oz (170 g) each day.  Limit sweets, desserts, sugars, and sugary drinks.  Choose heart-healthy fats.  Limit cheese to 1 oz (28 g) per day.  Eat more home-cooked food and less restaurant, buffet, and fast food.  Limit fried foods.  Cook foods using methods other than frying.  Limit canned vegetables. If you do use them, rinse them well to decrease the sodium.  When eating at a restaurant, ask that your food be prepared with less salt, or no salt if possible. WHAT FOODS CAN I EAT? Seek help from a dietitian for individual calorie needs. Grains Whole grain or whole wheat  bread. Brown rice. Whole grain or whole wheat pasta. Quinoa, bulgur, and whole grain cereals. Low-sodium cereals. Corn or whole wheat flour tortillas. Whole grain cornbread. Whole grain crackers. Low-sodium crackers. Vegetables Fresh or frozen vegetables (raw, steamed, roasted, or grilled). Low-sodium or reduced-sodium tomato and vegetable juices. Low-sodium or reduced-sodium tomato sauce and paste. Low-sodium or reduced-sodium canned vegetables.  Fruits All fresh, canned (in natural juice), or frozen fruits. Meat and Other Protein Products Ground beef (85% or leaner), grass-fed beef, or beef trimmed of fat. Skinless chicken or [place]. Ground chicken or [place]. Pork trimmed of fat. All fish and seafood. Eggs. Dried beans, peas, or lentils. Unsalted nuts and seeds. Unsalted canned beans. Dairy Low-fat dairy products, such as skim or 1% milk, 2% or reduced-fat cheeses, low-fat ricotta or cottage cheese, or plain low-fat yogurt. Low-sodium or reduced-sodium cheeses. Fats and Oils Tub margarines without trans fats. Light or reduced-fat mayonnaise and salad dressings (reduced sodium). Avocado. Safflower, olive, or canola oils. Natural peanut or almond butter. Other Unsalted popcorn and pretzels. The items listed above may not be a complete list of recommended foods or beverages. Contact your dietitian for more options. WHAT FOODS ARE NOT RECOMMENDED? Grains White bread. White pasta. White rice. Refined cornbread. Bagels and croissants. Crackers that contain trans fat. Vegetables Creamed or fried vegetables. Vegetables in a cheese sauce. Regular canned vegetables. Regular canned tomato sauce and paste. Regular tomato and vegetable juices. Fruits Dried fruits. Canned fruit in light or heavy syrup.  Fruit juice. Meat and Other Protein Products Fatty cuts of meat. Ribs, chicken wings, bacon, sausage, bologna, salami, chitterlings, fatback, hot dogs, bratwurst, and packaged luncheon meats. Salted nuts and  seeds. Canned beans with salt. Dairy Whole or 2% milk, cream, half-and-half, and cream cheese. Whole-fat or sweetened yogurt. Full-fat cheeses or blue cheese. Nondairy creamers and whipped toppings. Processed cheese, cheese spreads, or cheese curds. Condiments Onion and garlic salt, seasoned salt, table salt, and sea salt. Canned and packaged gravies. Worcestershire sauce. Tartar sauce. Barbecue sauce. Teriyaki sauce. Soy sauce, including reduced sodium. Steak sauce. Fish sauce. Oyster sauce. Cocktail sauce. Horseradish. Ketchup and mustard. Meat flavorings and tenderizers. Bouillon cubes. Hot sauce. Tabasco sauce. Marinades. Taco seasonings. Relishes. Fats and Oils Butter, stick margarine, lard, shortening, ghee, and bacon fat. Coconut, palm kernel, or palm oils. Regular salad dressings. Other Pickles and olives. Salted popcorn and pretzels. The items listed above may not be a complete list of foods and beverages to avoid. Contact your dietitian for more information. WHERE CAN I FIND MORE INFORMATION? National Heart, Lung, and Blood Institute: travelstabloid.com Document Released: 06/19/2011 Document Revised: 07/05/2013 Document Reviewed: 05/04/2013 [facility] Patient Information 2015 [place], [place]. This information is not intended to replace advice given to you by your health care provider. Make sure you discuss any questions you have with your health care provider.

## 2014-01-17 NOTE — Progress Notes (Signed)
Case discussed with Dr. Wilson at the time of the visit.  We reviewed the resident's history and exam and pertinent patient test results.  I agree with the assessment, diagnosis, and plan of care documented in the resident's note. 

## 2014-01-18 ENCOUNTER — Other Ambulatory Visit: Payer: Self-pay | Admitting: *Deleted

## 2014-01-19 MED ORDER — HYDROCODONE-ACETAMINOPHEN 5-325 MG PO TABS: 1.0000 | ORAL_TABLET | Freq: Every day | ORAL | Status: DC | PRN

## 2014-01-19 NOTE — Telephone Encounter (Signed)
Hydrocodone rx ready to be pick up - pt called.

## 2014-02-20 ENCOUNTER — Other Ambulatory Visit: Payer: Self-pay | Admitting: *Deleted

## 2014-02-20 MED ORDER — HYDROCODONE-ACETAMINOPHEN 5-325 MG PO TABS: 1.0000 | ORAL_TABLET | Freq: Every day | ORAL | Status: DC | PRN

## 2014-02-20 NOTE — Telephone Encounter (Signed)
Has contract. Only on #30 per 30 days. Will sch PCP appt Oct / Nov. Printed off three Rx.

## 2014-02-20 NOTE — Telephone Encounter (Signed)
Last filled 7/11

## 2014-05-06 ENCOUNTER — Emergency Department (HOSPITAL_COMMUNITY): Payer: BC Managed Care – PPO

## 2014-05-06 ENCOUNTER — Emergency Department (HOSPITAL_COMMUNITY)
Admission: EM | Admit: 2014-05-06 | Discharge: 2014-05-06 | Disposition: A | Discharge status: Home / Self Care | Payer: BC Managed Care – PPO | Attending: Emergency Medicine | Admitting: Emergency Medicine

## 2014-05-06 DIAGNOSIS — F10929 Alcohol use, unspecified with intoxication, unspecified: Secondary | ICD-10-CM

## 2014-05-06 DIAGNOSIS — I10 Essential (primary) hypertension: Secondary | ICD-10-CM | POA: Insufficient documentation

## 2014-05-06 DIAGNOSIS — Z720 Tobacco use: Secondary | ICD-10-CM | POA: Diagnosis not present

## 2014-05-06 DIAGNOSIS — F10129 Alcohol abuse with intoxication, unspecified: Secondary | ICD-10-CM | POA: Diagnosis not present

## 2014-05-06 DIAGNOSIS — S0003XA Contusion of scalp, initial encounter: Secondary | ICD-10-CM | POA: Insufficient documentation

## 2014-05-06 DIAGNOSIS — Y929 Unspecified place or not applicable: Secondary | ICD-10-CM | POA: Diagnosis not present

## 2014-05-06 DIAGNOSIS — S0990XA Unspecified injury of head, initial encounter: Secondary | ICD-10-CM | POA: Diagnosis present

## 2014-05-06 DIAGNOSIS — E785 Hyperlipidemia, unspecified: Secondary | ICD-10-CM | POA: Diagnosis not present

## 2014-05-06 DIAGNOSIS — Z79899 Other long term (current) drug therapy: Secondary | ICD-10-CM | POA: Insufficient documentation

## 2014-05-06 DIAGNOSIS — W19XXXA Unspecified fall, initial encounter: Secondary | ICD-10-CM | POA: Insufficient documentation

## 2014-05-06 DIAGNOSIS — Y939 Activity, unspecified: Secondary | ICD-10-CM | POA: Diagnosis not present

## 2014-05-06 MED ORDER — SODIUM CHLORIDE 0.9 % IV BOLUS (SEPSIS): 1000.0000 mL | Freq: Once | INTRAVENOUS | Status: DC

## 2014-05-06 MED ORDER — TETANUS-DIPHTH-ACELL PERTUSSIS 5-2.5-18.5 LF-MCG/0.5 IM SUSP
0.5000 mL | Freq: Once | INTRAMUSCULAR | Status: AC
  Administered 2014-05-06: 0.5 mL via INTRAMUSCULAR
  Filled 2014-05-06: qty 0.5

## 2014-05-06 NOTE — Discharge Instructions (Signed)
Alcohol Intoxication °Alcohol intoxication occurs when you drink enough alcohol that it affects your ability to function. It can be mild or very severe. Drinking a lot of alcohol in a short time is called binge drinking. This can be very harmful. Drinking alcohol can also be more dangerous if you are taking medicines or other drugs. Some of the effects caused by alcohol may include: °· Loss of coordination. °· Changes in mood and behavior. °· Unclear thinking. °· Trouble talking (slurred speech). °· Throwing up (vomiting). °· Confusion. °· Slowed breathing. °· Twitching and shaking (seizures). °· Loss of consciousness. °HOME CARE °· Do not drive after drinking alcohol. °· Drink enough water and fluids to keep your pee (urine) clear or pale yellow. Avoid caffeine. °· Only take medicine as told by your doctor. °GET HELP IF: °· You throw up (vomit) many times. °· You do not feel better after a few days. °· You frequently have alcohol intoxication. Your doctor can help decide if you should see a substance use treatment counselor. °GET HELP RIGHT AWAY IF: °· You become shaky when you stop drinking. °· You have twitching and shaking. °· You throw up blood. It may look bright red or like coffee grounds. °· You notice blood in your poop (bowel movements). °· You become lightheaded or pass out (faint). °MAKE SURE YOU:  °· Understand these instructions. °· Will watch your condition. °· Will get help right away if you are not doing well or get worse. °Document Released: 12/17/2007 Document Revised: 03/02/2013 Document Reviewed: 12/03/2012 °Exit[employer]® Patient Information ©2015 [employer]. This information is not intended to replace advice given to you by your health care provider. Make sure you discuss any questions you have with your health care provider. ° °

## 2014-05-06 NOTE — ED Notes (Signed)
To room via EMS.  Bystanders found pt on road, awake, abrasion hematona to back of head, bleeding conrtrolled.  ETOH, pt reports drinking beer today.  Pt denied LOC to EMS.

## 2014-05-06 NOTE — ED Notes (Signed)
Pt constantly trying to get out of the bed.  Reorienting pt to place, time, call bell.

## 2014-05-06 NOTE — ED Provider Notes (Signed)
CSN: [id number]     Arrival date & time 05/06/14  1931 History   First MD Initiated Contact with Patient 05/06/14 1938     Chief Complaint  Patient presents with  . Fall  . Head Injury     (Consider location/radiation/quality/duration/timing/severity/associated sxs/prior Treatment) Patient is a 58 y.o. male presenting with fall and head injury. The history is provided by the patient. No language interpreter was used.  Fall This is a new problem. The current episode started today. Pertinent negatives include no abdominal pain, arthralgias, chest pain, fatigue, fever, headaches, joint swelling, myalgias, nausea, neck pain, numbness, visual change, vomiting or weakness. Nothing aggravates the symptoms. He has tried nothing for the symptoms.  Head Injury Location:  L parietal Time since incident: unknown, today. Mechanism of injury: fall   Pain details:    Severity:  No pain Chronicity:  New Relieved by: EtOH. Worsened by:  Pressure Associated symptoms: no blurred vision, no difficulty breathing, no double vision, no focal weakness, no headaches, no loss of consciousness, no nausea, no neck pain, no numbness, no seizures and no vomiting   Risk factors: alcohol intake     Past Medical History  Diagnosis Date  . Alcohol abuse     hx of , sober since 1997  . Tobacco abuse     failed zyban  . 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 Weight gain   . Hyperlipidemia   . Hypertension    Past Surgical History  Procedure Laterality Date  . Lymph node biopsy  2011    lt axilla-negative  . Colonoscopy    . Mass excision Right 01/21/2013    Procedure: BIOPSY OF MASS RIGHT RING FINGER;  Surgeon: Cammie Sickle., MD;  Location: [facility];  Service: Orthopedics;  Laterality: Right;  . Excision metacarpal mass Right 02/03/2013    Procedure: WIDE RESECTION OF GRANULAR CELL TUMOR;  Surgeon: Cammie Sickle., MD;  Location: [facility];  Service:  Orthopedics;  Laterality: Right;  . Skin full thickness graft Right 02/03/2013    Procedure: SKIN GRAFT FULL THICKNESS RIGHT RING FINGER;  Surgeon: Cammie Sickle., MD;  Location: [facility];  Service: Orthopedics;  Laterality: Right;   Family History  Problem Relation Age of Onset  . Cancer Father    History  Substance Use Topics  . Smoking status: Current Every Day Smoker -- 0.50 packs/day    Types: Cigarettes, Cigars  . Smokeless tobacco: Not on file     Comment: occassional cigar  . Alcohol Use: Yes     Comment: 1 -2 beers on the w/e    Review of Systems  Constitutional: Negative for fever, appetite change and fatigue.  HENT: Negative for dental problem.   Eyes: Negative for blurred vision, double vision, pain and visual disturbance.  Respiratory: Negative for chest tightness and shortness of breath.   Cardiovascular: Negative for chest pain.  Gastrointestinal: Negative for nausea, vomiting and abdominal pain.  Musculoskeletal: Negative for arthralgias, gait problem, joint swelling, myalgias and neck pain.  Skin: Positive for wound.  Neurological: Negative for dizziness, focal weakness, seizures, loss of consciousness, syncope, speech difficulty, weakness, light-headedness, numbness and headaches.  All other systems reviewed and are negative.     Allergies  Codeine; Hydrochlorothiazide; and Lisinopril  Home Medications   Prior to Admission medications   Medication Sig Start Date End Date Taking? Authorizing Provider  amLODipine (NORVASC) 10 MG tablet Take  1 tablet (10 mg total) by mouth daily. 11/18/13   Hester Mates, MD  Blood Pressure Monitoring (BLOOD PRESSURE CUFF) MISC 1 kit by Does not apply route 2 (two) times daily. Check blood pressure in morning and afternoon. 04/22/13   Jessee Avers, MD  carvedilol (COREG) 12.5 MG tablet Take 1 tablet (12.5 mg total) by mouth 2 (two) times daily with a meal. 11/18/13   Hester Mates, MD   HYDROcodone-acetaminophen (NORCO/VICODIN) 5-325 MG per tablet Take 1 tablet by mouth daily as needed. 02/20/14   Bartholomew Crews, MD  losartan (COZAAR) 50 MG tablet Take 1 tablet (50 mg total) by mouth daily. 12/01/13 12/01/14  Hester Mates, MD  rosuvastatin (CRESTOR) 10 MG tablet Take 1 tablet (10 mg total) by mouth daily. 04/22/13   Jessee Avers, MD   BP 166/84  Pulse 86  Temp(Src) 99.2 F (37.3 C) (Oral)  Resp 17  SpO2 97% Physical Exam  Vitals reviewed. Constitutional: He appears well-developed and well-nourished. No distress.  HENT:  Mouth/Throat: Oropharynx is clear and moist.  Hematoma and abrasion to left parietal scalp  Eyes: EOM are normal. Pupils are equal, round, and reactive to light.  Neck: Neck supple.  Cardiovascular: Normal rate, regular rhythm and intact distal pulses.   Pulmonary/Chest: Effort normal and breath sounds normal. He exhibits no tenderness.  Abdominal: Soft. He exhibits no distension. There is no tenderness.  Musculoskeletal: Normal range of motion.  Neurological: He is alert. He has normal strength. He displays no tremor. No cranial nerve deficit or sensory deficit. GCS eye subscore is 4. GCS verbal subscore is 5. GCS motor subscore is 6.  Intoxicated. Assistance required for ambulation  Skin: Skin is warm.    ED Course  Procedures (including critical care time) Labs Review Labs Reviewed - No data to display  Imaging Review Ct Head Wo Contrast  05/06/2014   CLINICAL DATA:  Fall, hit head, left posterior contusion  EXAM: CT HEAD WITHOUT CONTRAST  TECHNIQUE: Contiguous axial images were obtained from the base of the skull through the vertex without intravenous contrast.  COMPARISON:  None.  FINDINGS: Motion degraded images.  No evidence of parenchymal hemorrhage or extra-axial fluid collection. No mass lesion, mass effect, or midline shift.  No CT evidence of acute infarction.  Subcortical white matter and periventricular small vessel ischemic  changes.  The visualized paranasal sinuses are essentially clear. The mastoid air cells are unopacified.  Possible small extracranial hematoma overlying the left parietal bone (series 4/image 13).  No evidence of calvarial fracture.  IMPRESSION: Possible small extracranial hematoma overlying the left parietal bone. No evidence of calvarial fracture.  No evidence of acute intracranial abnormality.   Electronically Signed   By: Julian Hy M.D.   On: 05/06/2014 21:00     EKG Interpretation   Date/Time:  Saturday May 06 2014 19:40:15 EDT Ventricular Rate:  89 PR Interval:  128 QRS Duration: 85 QT Interval:  349 QTC Calculation: 425 R Axis:   4 Text Interpretation:  Sinus rhythm Left ventricular hypertrophy Baseline  wander in lead(s) V2 Nonspecific T wave abnormality not significantly  different than 2014 Confirmed by [place]  MD, [place] ([id number]) on 05/06/2014  8:20:56 PM      MDM   Final diagnoses:  Fall    58 y/o male with history of EtOH abuse, HTN presenting intoxicated after fall. Found on side of road near home. Hematoma to back of head with abrasion. No other injury identified on exam. Given  intoxication, will require head CT.   CT without abnormality other than scalp hematoma. Tetanus updated. Reassessed at 10 pm with steady gait, AAO. Appropriate for discharge home. ED return precautions discussed and pt in agreement.   Imaging reviewed in my medical decision making. Pt discussed with my attending, Dr. Regenia Skeeter.    Amparo Bristol, MD 05/07/14 1534

## 2014-05-06 NOTE — ED Notes (Signed)
Pt bag of belongings (license, wallet, phone, knife) given to pt.

## 2014-05-10 NOTE — ED Provider Notes (Signed)
I saw and evaluated the patient, reviewed the resident's note and I agree with the findings and plan.   EKG Interpretation   Date/Time:  Saturday May 06 2014 19:40:15 EDT Ventricular Rate:  89 PR Interval:  128 QRS Duration: 85 QT Interval:  349 QTC Calculation: 425 R Axis:   4 Text Interpretation:  Sinus rhythm Left ventricular hypertrophy Baseline  wander in lead(s) V2 Nonspecific T wave abnormality not significantly  different than 2014 Confirmed by Trust Leh  MD, [place] ([id number]) on 05/06/2014  8:20:56 PM       Patient intoxicated with head injury. Metabolized normally and CT negative. Neuro exam normal. Stable for discharge now that he is sober  Ephraim Hamburger, MD 05/10/14 [phone]

## 2014-05-24 ENCOUNTER — Encounter: Payer: Self-pay | Admitting: Internal Medicine

## 2014-05-24 ENCOUNTER — Ambulatory Visit (INDEPENDENT_AMBULATORY_CARE_PROVIDER_SITE_OTHER): Payer: BC Managed Care – PPO | Admitting: Internal Medicine

## 2014-05-24 VITALS — BP 172/80 | HR 70 | Temp 99.0°F | Ht 65.0 in | Wt 161.8 lb

## 2014-05-24 DIAGNOSIS — Z720 Tobacco use: Secondary | ICD-10-CM | POA: Diagnosis not present

## 2014-05-24 DIAGNOSIS — Z299 Encounter for prophylactic measures, unspecified: Secondary | ICD-10-CM

## 2014-05-24 DIAGNOSIS — M5030 Other cervical disc degeneration, unspecified cervical region: Secondary | ICD-10-CM

## 2014-05-24 DIAGNOSIS — I10 Essential (primary) hypertension: Secondary | ICD-10-CM

## 2014-05-24 DIAGNOSIS — F17200 Nicotine dependence, unspecified, uncomplicated: Secondary | ICD-10-CM

## 2014-05-24 DIAGNOSIS — E785 Hyperlipidemia, unspecified: Secondary | ICD-10-CM

## 2014-05-24 DIAGNOSIS — I498 Other specified cardiac arrhythmias: Secondary | ICD-10-CM

## 2014-05-24 DIAGNOSIS — Z418 Encounter for other procedures for purposes other than remedying health state: Secondary | ICD-10-CM

## 2014-05-24 HISTORY — DX: Other specified cardiac arrhythmias: I49.8

## 2014-05-24 MED ORDER — LOSARTAN POTASSIUM 100 MG PO TABS: 50.0000 mg | ORAL_TABLET | Freq: Every day | ORAL | Status: DC

## 2014-05-24 MED ORDER — HYDROCODONE-ACETAMINOPHEN 5-325 MG PO TABS: 1.0000 | ORAL_TABLET | Freq: Every day | ORAL | Status: DC | PRN

## 2014-05-24 MED ORDER — ROSUVASTATIN CALCIUM 10 MG PO TABS: 10.0000 mg | ORAL_TABLET | Freq: Every day | ORAL | Status: DC

## 2014-05-24 NOTE — Progress Notes (Signed)
Subjective:     Patient ID: Devon Vega, male   DOB: 03/16/1956, 58 y.o.   MRN: [id number]  HPI  Mr. Dobek is a 58 yo male with PMHx of HTN, HLD, degenerative disc disease, tobacco abuse who presents today for routine follow up. Patient denies any complaints. He is here for medication refills.    Review of Systems  General: Denies fever, chills, fatigue, change in appetite and diaphoresis.  Respiratory: Denies SOB, cough, DOE, chest tightness, and wheezing.   Cardiovascular: Denies chest pain and palpitations.  Gastrointestinal: Denies nausea, vomiting, abdominal pain, diarrhea, constipation, blood in stool and abdominal distention.  Genitourinary: Denies dysuria, urgency, frequency, hematuria, suprapubic pain and flank pain. Endocrine: Denies hot or cold intolerance, polyuria, and polydipsia. Musculoskeletal: Denies myalgias, back pain, joint swelling, arthralgias and gait problem.  Skin: Denies pallor, rash and wounds.  Neurological: Denies dizziness, headaches, weakness, lightheadedness, numbness,seizures, and syncope, Psychiatric/Behavioral: Denies mood changes, confusion, nervousness, sleep disturbance and agitation.     Objective:   Physical Exam Filed Vitals:   05/24/14 1542 05/24/14 1620  BP: 180/70 172/80  Pulse: 76 70  Temp: 99 F (37.2 C)   TempSrc: Oral   Height: 5\' 5"  (1.651 m)   Weight: 161 lb 12.8 oz (73.392 kg)   SpO2: 100%    General: Vital signs reviewed.  Patient is well-developed and well-nourished, in no acute distress and cooperative with exam.  Cardiovascular: Regular rate, irregular rhythm, S1 normal, S2 normal, no murmurs, gallops, or rubs. Pulmonary/Chest: Clear to auscultation bilaterally, no wheezes, rales, or rhonchi. Abdominal: Soft, non-tender, non-distended, BS +, no masses, organomegaly, or guarding present.  Musculoskeletal: No joint deformities, erythema, or stiffness, ROM full and nontender. Extremities: No lower extremity edema  bilaterally,  pulses symmetric and intact bilaterally. No cyanosis or clubbing. Neurological: A&O x3, Strength is normal and symmetric bilaterally, cranial nerve II-XII are grossly intact, no focal motor deficit, sensory intact to light touch bilaterally.  Skin: Warm, dry and intact. No rashes or erythema. Psychiatric: Normal mood and affect. speech and behavior is normal. Cognition and memory are normal.      Assessment:         Plan:     Please see problem based assessment and plan.

## 2014-05-24 NOTE — Assessment & Plan Note (Signed)
  Assessment: Progress toward smoking cessation:   None Barriers to progress toward smoking cessation:   Motivation Comments: Patient is not interested in smoking cessation at this time.  Plan: Instruction/counseling given:  I counseled patient on the dangers of tobacco use, advised patient to stop smoking, and reviewed strategies to maximize success. Educational resources provided:   None Self management tools provided:   None Medications to assist with smoking cessation:  None Patient agreed to the following self-care plans for smoking cessation: cut down the number of cigarettes smoked  Other plans: Patient will think about it and we will re-discuss in one month.

## 2014-05-24 NOTE — Assessment & Plan Note (Signed)
Continue Rosuvastatin 10 mg daily 

## 2014-05-24 NOTE — Assessment & Plan Note (Signed)
Patient has an irregular rhythm, not atrial fibrillation. This was picked up on heart auscultation, but confirmed with prior EKGs. Patient is on Coreg 12.5 mg BID which controls rate. Continue Coreg.

## 2014-05-24 NOTE — Assessment & Plan Note (Signed)
BP Readings from Last 3 Encounters:  05/24/14 172/80  05/06/14 160/82  01/12/14 140/80    Lab Results  Component Value Date   NA 142 12/22/2013   K 4.2 12/22/2013   CREATININE 1.01 12/22/2013    Assessment: Blood pressure control:  Uncontrolled Progress toward BP goal:  Deteriorated Comments: Good compliance with medications. Home readings range from 140-180s.  Plan: Medications: Increased Cozaar from 50 mg daily to 100 mg daily. Continue norvasc 10 mg QD and Coreg 12.5 mg BID. Educational resources provided:  None Self management tools provided:  None Other plans: If blood pressure not controlled in one month, consider adding spironolactone or chlorthalidone.

## 2014-05-24 NOTE — Assessment & Plan Note (Signed)
Patient has had flu shot and tetanus booster.

## 2014-05-24 NOTE — Assessment & Plan Note (Signed)
Refilled Norco 5-325 #30 for 3 refills. Patient only takes one pill a day and pain is well controlled.

## 2014-05-24 NOTE — Patient Instructions (Signed)
General Instructions:   Please bring your medicines with you each time you come to clinic.  Medicines may include prescription medications, over-the-counter medications, herbal remedies, eye drops, vitamins, or other pills.   I am increasing your Cozaar to 100 mg (1 pill) daily. Please return to the clinic in about one month to have your blood pressure rechecked.    Progress Toward Treatment Goals:  Treatment Goal 12/01/2013  Blood pressure at goal  Stop smoking -    Self Care Goals & Plans:  Self Care Goal 05/24/2014  Manage my medications take my medicines as prescribed; bring my medications to every visit; refill my medications on time  Monitor my health keep track of my blood pressure  Eat healthy foods eat more vegetables; eat foods that are low in salt; eat baked foods instead of fried foods  Stop smoking cut down the number of cigarettes smoked    No flowsheet data found.   Care Management & Community Referrals:  Referral 12/01/2013  Referrals made for care management support none needed     Smoking Cessation Quitting smoking is important to your health and has many advantages. However, it is not always easy to quit since nicotine is a very addictive drug. Oftentimes, people try 3 times or more before being able to quit. This document explains the best ways for you to prepare to quit smoking. Quitting takes hard work and a lot of effort, but you can do it. ADVANTAGES OF QUITTING SMOKING  You will live longer, feel better, and live better.  Your body will feel the impact of quitting smoking almost immediately.  Within 20 minutes, blood pressure decreases. Your pulse returns to its normal level.  After 8 hours, carbon monoxide levels in the blood return to normal. Your oxygen level increases.  After 24 hours, the chance of having a heart attack starts to decrease. Your breath, hair, and body stop smelling like smoke.  After 48 hours, damaged nerve endings begin to  recover. Your sense of taste and smell improve.  After 72 hours, the body is virtually free of nicotine. Your bronchial tubes relax and breathing becomes easier.  After 2 to 12 weeks, lungs can hold more air. Exercise becomes easier and circulation improves.  The risk of having a heart attack, stroke, cancer, or lung disease is greatly reduced.  After 1 year, the risk of coronary heart disease is cut in half.  After 5 years, the risk of stroke falls to the same as a nonsmoker.  After 10 years, the risk of lung cancer is cut in half and the risk of other cancers decreases significantly.  After 15 years, the risk of coronary heart disease drops, usually to the level of a nonsmoker.  If you are pregnant, quitting smoking will improve your chances of having a healthy baby.  The people you live with, especially any children, will be healthier.  You will have extra money to spend on things other than cigarettes. QUESTIONS TO THINK ABOUT BEFORE ATTEMPTING TO QUIT You may want to talk about your answers with your health care provider.  Why do you want to quit?  If you tried to quit in the past, what helped and what did not?  What will be the most difficult situations for you after you quit? How will you plan to handle them?  Who can help you through the tough times? Your family? Friends? A health care provider?  What pleasures do you get from smoking? What ways can  you still get pleasure if you quit? Here are some questions to ask your health care provider:  How can you help me to be successful at quitting?  What medicine do you think would be best for me and how should I take it?  What should I do if I need more help?  What is smoking withdrawal like? How can I get information on withdrawal? GET READY  Set a quit date.  Change your environment by getting rid of all cigarettes, ashtrays, matches, and lighters in your home, car, or work. Do not let people smoke in your  home.  Review your past attempts to quit. Think about what worked and what did not. GET SUPPORT AND ENCOURAGEMENT You have a better chance of being successful if you have help. You can get support in many ways.  Tell your family, friends, and coworkers that you are going to quit and need their support. Ask them not to smoke around you.  Get individual, group, or telephone counseling and support. Programs are available at [employer] and health centers. Call your local health department for information about programs in your area.  Spiritual beliefs and practices may help some smokers quit.  Download a "quit meter" on your computer to keep track of quit statistics, such as how long you have gone without smoking, cigarettes not smoked, and money saved.  Get a self-help book about quitting smoking and staying off tobacco. [place] yourself from urges to smoke. Talk to someone, go for a walk, or occupy your time with a task.  Change your normal routine. Take a different route to work. Drink tea instead of coffee. Eat breakfast in a different place.  Reduce your stress. Take a hot bath, exercise, or read a book.  Plan something enjoyable to do every day. Reward yourself for not smoking.  Explore interactive web-based programs that specialize in helping you quit. GET MEDICINE AND USE IT CORRECTLY Medicines can help you stop smoking and decrease the urge to smoke. Combining medicine with the above behavioral methods and support can greatly increase your chances of successfully quitting smoking.  Nicotine replacement therapy helps deliver nicotine to your body without the negative effects and risks of smoking. Nicotine replacement therapy includes nicotine gum, lozenges, inhalers, nasal sprays, and skin patches. Some may be available over-the-counter and others require a prescription.  Antidepressant medicine helps people abstain from smoking, but how this  works is unknown. This medicine is available by prescription.  Nicotinic receptor partial agonist medicine simulates the effect of nicotine in your brain. This medicine is available by prescription. Ask your health care provider for advice about which medicines to use and how to use them based on your health history. Your health care provider will tell you what side effects to look out for if you choose to be on a medicine or therapy. Carefully read the information on the package. Do not use any other product containing nicotine while using a nicotine replacement product.  RELAPSE OR DIFFICULT SITUATIONS Most relapses occur within the first 3 months after quitting. Do not be discouraged if you start smoking again. Remember, most people try several times before finally quitting. You may have symptoms of withdrawal because your body is used to nicotine. You may crave cigarettes, be irritable, feel very hungry, cough often, get headaches, or have difficulty concentrating. The withdrawal symptoms are only temporary. They are strongest when you first quit, but they will go away within 10-14  days. To reduce the chances of relapse, try to:  Avoid drinking alcohol. Drinking lowers your chances of successfully quitting.  Reduce the amount of caffeine you consume. Once you quit smoking, the amount of caffeine in your body increases and can give you symptoms, such as a rapid heartbeat, sweating, and anxiety.  Avoid smokers because they can make you want to smoke.  Do not let weight gain distract you. Many smokers will gain weight when they quit, usually less than 10 pounds. Eat a healthy diet and stay active. You can always lose the weight gained after you quit.  Find ways to improve your mood other than smoking. FOR MORE INFORMATION  www.smokefree.gov  Document Released: 06/24/2001 Document Revised: 11/14/2013 Document Reviewed: 10/09/2011 [facility] Patient Information 2015 [place], [place]. This information  is not intended to replace advice given to you by your health care provider. Make sure you discuss any questions you have with your health care provider.    Nicotine Addiction Nicotine can act as both a stimulant (excites/activates) and a sedative (calms/quiets). Immediately after exposure to nicotine, there is a "kick" caused in part by the drug's stimulation of the adrenal glands and resulting discharge of adrenaline (epinephrine). The rush of adrenaline stimulates the body and causes a sudden release of sugar. This means that smokers are always slightly hyperglycemic. Hyperglycemic means that the blood sugar is high, just like in diabetics. Nicotine also decreases the amount of insulin which helps control sugar levels in the body. There is an increase in blood pressure, breathing, and the rate of heart beats.  In addition, nicotine indirectly causes a release of dopamine in the brain that controls pleasure and motivation. A similar reaction is seen with other drugs of abuse, such as cocaine and heroin. This dopamine release is thought to cause the pleasurable sensations when smoking. In some different cases, nicotine can also create a calming effect, depending on sensitivity of the smoker's nervous system and the dose of nicotine taken. WHAT HAPPENS WHEN NICOTINE IS TAKEN FOR LONG PERIODS OF TIME?  Long-term use of nicotine results in addiction. It is difficult to stop.  Repeated use of nicotine creates tolerance. Higher doses of nicotine are needed to get the "kick." When nicotine use is stopped, withdrawal may last a month or more. Withdrawal may begin within a few hours after the last cigarette. Symptoms peak within the first few days and may lessen within a few weeks. For some people, however, symptoms may last for months or longer. Withdrawal symptoms include:   Irritability.  Craving.  Learning and attention deficits.  Sleep disturbances.  Increased appetite. Craving for tobacco may  last for 6 months or longer. Many behaviors done while using nicotine can also play a part in the severity of withdrawal symptoms. For some people, the feel, smell, and sight of a cigarette and the ritual of obtaining, handling, lighting, and smoking the cigarette are closely linked with the pleasure of smoking. When stopped, they also miss the related behaviors which make the withdrawal or craving worse. While nicotine gum and patches may lessen the drug aspects of withdrawal, cravings often persist. WHAT ARE THE MEDICAL CONSEQUENCES OF NICOTINE USE?  Nicotine addiction accounts for one-third of all cancers. The top cancer caused by tobacco is lung cancer. Lung cancer is the number one cancer killer of both men and women.  Smoking is also associated with cancers of the:  Mouth.  Pharynx.  Larynx.  Esophagus.  Stomach.  Pancreas.  Cervix.  Kidney.  Ureter.  Bladder.  Smoking also causes lung diseases such as lasting (chronic) bronchitis and emphysema.  It worsens asthma in adults and children.  Smoking increases the risk of heart disease, including:  Stroke.  Heart attack.  Vascular disease.  Aneurysm.  Passive or secondary smoke can also increase medical risks including:  Asthma in children.  Sudden Infant Death Syndrome (SIDS).  Additionally, dropped cigarettes are the leading cause of residential fire fatalities.  Nicotine poisoning has been reported from accidental ingestion of tobacco products by children and pets. Death usually results in a few minutes from respiratory failure (when a person stops breathing) caused by paralysis. TREATMENT   Medication. Nicotine replacement medicines such as nicotine gum and the patch are used to stop smoking. These medicines gradually lower the dosage of nicotine in the body. These medicines do not contain the carbon monoxide and other toxins found in tobacco smoke.  Hypnotherapy.  Relaxation therapy.  Nicotine  Anonymous (a 12-step support program). Find times and locations in your local yellow pages. Document Released: 03/05/2004 Document Revised: 09/22/2011 Document Reviewed: 08/26/2013 [facility] Patient Information 2015 [place], [place]. This information is not intended to replace advice given to you by your health care provider. Make sure you discuss any questions you have with your health care provider.

## 2014-05-25 NOTE — Progress Notes (Signed)
Internal Medicine Clinic Attending  I saw and evaluated the patient.  I personally confirmed the key portions of the history and exam documented by Dr. Richardson and I reviewed pertinent patient test results.  The assessment, diagnosis, and plan were formulated together and I agree with the documentation in the resident's note. 

## 2014-07-03 ENCOUNTER — Ambulatory Visit: Payer: BC Managed Care – PPO | Admitting: Internal Medicine

## 2014-08-25 ENCOUNTER — Encounter: Payer: Self-pay | Admitting: Internal Medicine

## 2014-08-25 ENCOUNTER — Ambulatory Visit (INDEPENDENT_AMBULATORY_CARE_PROVIDER_SITE_OTHER): Payer: Self-pay | Admitting: Internal Medicine

## 2014-08-25 VITALS — BP 158/83 | HR 81 | Temp 98.8°F | Ht 65.0 in | Wt 166.1 lb

## 2014-08-25 DIAGNOSIS — Z418 Encounter for other procedures for purposes other than remedying health state: Secondary | ICD-10-CM

## 2014-08-25 DIAGNOSIS — I10 Essential (primary) hypertension: Secondary | ICD-10-CM

## 2014-08-25 DIAGNOSIS — Z720 Tobacco use: Secondary | ICD-10-CM

## 2014-08-25 DIAGNOSIS — F17200 Nicotine dependence, unspecified, uncomplicated: Secondary | ICD-10-CM

## 2014-08-25 DIAGNOSIS — M5030 Other cervical disc degeneration, unspecified cervical region: Secondary | ICD-10-CM

## 2014-08-25 DIAGNOSIS — N529 Male erectile dysfunction, unspecified: Secondary | ICD-10-CM | POA: Insufficient documentation

## 2014-08-25 DIAGNOSIS — Z299 Encounter for prophylactic measures, unspecified: Secondary | ICD-10-CM

## 2014-08-25 LAB — POCT GLYCOSYLATED HEMOGLOBIN (HGB A1C): Hemoglobin A1C: 5.7

## 2014-08-25 LAB — TSH: TSH: 1.104 u[IU]/mL (ref 0.350–4.500)

## 2014-08-25 LAB — GLUCOSE, CAPILLARY: Glucose-Capillary: 92 mg/dL (ref 70–99)

## 2014-08-25 MED ORDER — LOSARTAN POTASSIUM 100 MG PO TABS: 100.0000 mg | ORAL_TABLET | Freq: Every day | ORAL | Status: DC

## 2014-08-25 MED ORDER — HYDROCODONE-ACETAMINOPHEN 5-325 MG PO TABS: 1.0000 | ORAL_TABLET | Freq: Every day | ORAL | Status: DC | PRN

## 2014-08-25 MED ORDER — SILDENAFIL CITRATE 50 MG PO TABS: 50.0000 mg | ORAL_TABLET | Freq: Every day | ORAL | Status: DC | PRN

## 2014-08-25 NOTE — Progress Notes (Signed)
   Subjective:    Patient ID: Devon Vega, male    DOB: 10-Apr-1956, 59 y.o.   MRN: [id number]  HPI Mr. Hearn is a 59 yo male with PMHx of HTN, HLD, DJD, and tobacco abuse who presents today for follow up. Please see problem based assessment and plan for more information.  Review of Systems General: Denies fever, chills, fatigue, decreased appetite, weight loss and diaphoresis.  Respiratory: Denies SOB, cough Cardiovascular: Denies chest pain Gastrointestinal: Denies diarrhea, constipation, blood in stool Genitourinary: Admits to difficulty maintaining erection. Denies urgency, frequency, hematuria Musculoskeletal: Admits to chronic neck pain.  Neurological: Denies dizziness, headaches, weakness, lightheadedness Psychiatric/Behavioral: Denies anxiety, depression, nervousness, sleep disturbance   Past Medical History  Diagnosis Date  . Alcohol abuse     hx of , sober since 1997  . Tobacco abuse     failed zyban  . 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 Weight gain   . Hyperlipidemia   . Hypertension   . Sinus arrhythmia 05/24/2014   Outpatient Encounter Prescriptions as of 08/25/2014  Medication Sig  . amLODipine (NORVASC) 10 MG tablet Take 1 tablet (10 mg total) by mouth daily.  . carvedilol (COREG) 12.5 MG tablet Take 1 tablet (12.5 mg total) by mouth 2 (two) times daily with a meal.  . HYDROcodone-acetaminophen (NORCO/VICODIN) 5-325 MG per tablet Take 1 tablet by mouth daily as needed.  Marland Kitchen HYDROcodone-acetaminophen (NORCO/VICODIN) 5-325 MG per tablet Take 1 tablet by mouth daily as needed.  Marland Kitchen HYDROcodone-acetaminophen (NORCO/VICODIN) 5-325 MG per tablet Take 1 tablet by mouth daily as needed.  Marland Kitchen losartan (COZAAR) 100 MG tablet Take 0.5 tablets (50 mg total) by mouth daily.  . rosuvastatin (CRESTOR) 10 MG tablet Take 1 tablet (10 mg total) by mouth daily.      Objective:   Physical Exam Filed Vitals:   08/25/14 1525  BP: 158/83  Pulse: 81  Temp: 98.8 F  (37.1 C)  TempSrc: Oral  Height: 5\' 5"  (1.651 m)  Weight: 166 lb 1.6 oz (75.342 kg)  SpO2: 100%   General: Vital signs reviewed.  Patient is well-developed and well-nourished, in no acute distress and cooperative with exam.  Neck: Supple, normal ROM, no JVD, no carotid bruit present.  Cardiovascular: Regular rate, irregular rhythm. Pulmonary/Chest: Clear to auscultation bilaterally, no wheezes, rales, or rhonchi. Abdominal: Soft, non-tender, non-distended, BS + Extremities: No lower extremity edema bilaterally Neurological: A&O x3 Psychiatric: Normal mood and affect. speech and behavior is normal. Cognition and memory are normal.     Assessment & Plan:   Please see problem based assessment and plan.

## 2014-08-25 NOTE — Patient Instructions (Signed)
General Instructions:   Please bring your medicines with you each time you come to clinic.  Medicines may include prescription medications, over-the-counter medications, herbal remedies, eye drops, vitamins, or other pills.   FOR YOUR HIGH BLOOD PRESSURE: -CONTINUE AMLODIPINE 10 MG ONCE A DAY -CONTINUE CARVEDILOL 12.5 MG ONCE IN THE MORNING AND ONCE AT NIGHT -TAKE LOSARTAN 100 MG ONCE A DAY -I WILL CALL YOU IN TWO WEEK TO SEE HOW YOUR BLOOD PRESSURE IS DOING -PLEASE CHECK AND RECORD YOUR BLOOD PRESSURE EVERY DAY  FOR YOUR ERECTILE DYSFUNCTION: -TRY VIAGRA AS NEEDED -CALL AND REQUEST MORE REFILLS IF NEEDED -I AM CHECK LABS TODAY TO RULE OUT ANY CAUSES    Sildenafil tablets (Viagra) What is this medicine? SILDENAFIL (sil DEN a fil) is used to treat erection problems in men. This medicine may be used for other purposes; ask your health care provider or pharmacist if you have questions. COMMON BRAND NAME(S): Viagra What should I tell my health care provider before I take this medicine? They need to know if you have any of these conditions: -bleeding disorders -eye or vision problems, including a rare inherited eye disease called retinitis pigmentosa -anatomical deformation of the penis, Peyronie's disease, or history of priapism (painful and prolonged erection) -heart disease, angina, a history of heart attack, irregular heart beats, or other heart problems -high or low blood pressure -history of blood diseases, like sickle cell anemia or leukemia -history of stomach bleeding -kidney disease -liver disease -stroke -an unusual or allergic reaction to sildenafil, other medicines, foods, dyes, or preservatives -pregnant or trying to get pregnant -breast-feeding How should I use this medicine? Take this medicine by mouth with a glass of water. Follow the directions on the prescription label. The dose is usually taken 1 hour before sexual activity. You should not take the dose more  than once per day. Do not take your medicine more often than directed. Talk to your pediatrician regarding the use of this medicine in children. This medicine is not used in children for this condition. Overdosage: If you think you have taken too much of this medicine contact a poison control center or emergency room at once. NOTE: This medicine is only for you. Do not share this medicine with others. What if I miss a dose? This does not apply. Do not take double or extra doses. What may interact with this medicine? Do not take this medicine with any of the following medications: -cisapride -methscopolamine nitrate -nitrates like amyl nitrite, isosorbide dinitrate, isosorbide mononitrate, nitroglycerin -nitroprusside -other medicines for erectile dysfunction like avanafil, tadalafil, vardenafil -other sildenafil products (Revatio) This medicine may also interact with the following medications: -certain drugs for high blood pressure -certain drugs for the treatment of HIV infection or AIDS -certain drugs used for fungal or yeast infections, like fluconazole, itraconazole, ketoconazole, and voriconazole -cimetidine -erythromycin -rifampin This list may not describe all possible interactions. Give your health care provider a list of all the medicines, herbs, non-prescription drugs, or dietary supplements you use. Also tell them if you smoke, drink alcohol, or use illegal drugs. Some items may interact with your medicine. What should I watch for while using this medicine? If you notice any changes in your vision while taking this drug, call your doctor or health care professional as soon as possible. Stop using this medicine and call your health care provider right away if you have a loss of sight in one or both eyes. Contact your doctor or health care professional right away if  you have an erection that lasts longer than 4 hours or if it becomes painful. This may be a sign of a serious problem  and must be treated right away to prevent permanent damage. If you experience symptoms of nausea, dizziness, chest pain or arm pain upon initiation of sexual activity after taking this medicine, you should refrain from further activity and call your doctor or health care professional as soon as possible. Do not drink alcohol to excess (examples, 5 glasses of wine or 5 shots of whiskey) when taking this medicine. When taken in excess, alcohol can increase your chances of getting a headache or getting dizzy, increasing your heart rate or lowering your blood pressure. Using this medicine does not protect you or your partner against HIV infection (the virus that causes AIDS) or other sexually transmitted diseases. What side effects may I notice from receiving this medicine? Side effects that you should report to your doctor or health care professional as soon as possible: -allergic reactions like skin rash, itching or hives, swelling of the face, lips, or tongue -breathing problems -changes in hearing -changes in vision -chest pain -fast, irregular heartbeat -prolonged or painful erection -seizures Side effects that usually do not require medical attention (report to your doctor or health care professional if they continue or are bothersome): -back pain -dizziness -flushing -headache -indigestion -muscle aches -nausea -stuffy or runny nose This list may not describe all possible side effects. Call your doctor for medical advice about side effects. You may report side effects to FDA at 1-800-FDA-1088. Where should I keep my medicine? Keep out of reach of children. Store at room temperature between 15 and 30 degrees C (59 and 86 degrees F). Throw away any unused medicine after the expiration date. NOTE: This sheet is a summary. It may not cover all possible information. If you have questions about this medicine, talk to your doctor, pharmacist, or health care provider.  2015, Elsevier/Gold  Standard. (2012-06-30 12:43:54)

## 2014-08-25 NOTE — Assessment & Plan Note (Addendum)
BP Readings from Last 3 Encounters:  08/25/14 158/83  05/24/14 172/80  05/06/14 160/82    Lab Results  Component Value Date   NA 142 12/22/2013   K 4.2 12/22/2013   CREATININE 1.01 12/22/2013    Assessment: Blood pressure control:  Uncontrolled Progress toward BP goal:   Improved Comments: Patient reports compliance with norvasc 10 mg daily, and coreg 12.5 mg BID, but he is unsure if he has been taking Cozaar 50 mg daily or 100 mg daily. He reports blood pressures at home of 120-150s/90s.  Plan: Medications: Re-prescribed current medications with instruction of how much to take and when.  Other plans: Patient will check BP daily and home and record. Recheck BP in 2-4 weeks. If still elevated, consider increasing coreg to 25 mg BID.

## 2014-08-25 NOTE — Assessment & Plan Note (Addendum)
Flu Shot: 2015 Tetanus: Up to date Prevnar: Not today, will consider at next visit Colonoscopy: Normal in 2010, repeat in 2020.   Prostate Cancer: Patient questioned need for prostate cancer screen with prostate exam and PSA. Patient is asymptomatic and has no known family history. We discussed options and how screening is not recommended and controversial. Patient was provided with information regarding and screening and on prostate cancer.

## 2014-08-27 NOTE — Assessment & Plan Note (Signed)
  Assessment: Progress toward smoking cessation:   No progress Barriers to progress toward smoking cessation:    Does not want to quit, motivation Comments: Patient is not mentally ready to quit  Plan: Instruction/counseling given:  I counseled patient on the dangers of tobacco use, advised patient to stop smoking, and reviewed strategies to maximize success. Medications to assist with smoking cessation:  None Patient agreed to the following self-care plans for smoking cessation: cut down the number of cigarettes smoked

## 2014-08-27 NOTE — Assessment & Plan Note (Addendum)
Patient complains of trouble maintaining erection for 2 months. He denies trouble obtaining erection. This is a new problem for him and very bothersome. He denies any change in mood, anxiety, depression, decreased sleep, no change in medications. He requests Viagra.  Plan: -Trial of Viagra 50 mg #5 -Check TSH>>normal -Check HgbA1c>>5.7

## 2014-08-27 NOTE — Assessment & Plan Note (Signed)
Stable. Patient takes one Norco 5-325 mg at night.   -Refilled Norco 5-325 mg #30 with 3 refills

## 2014-08-29 NOTE — Progress Notes (Signed)
INTERNAL MEDICINE TEACHING ATTENDING ADDENDUM - Sherylann Vangorden, MD: I reviewed and discussed at the time of visit with the resident Dr. Richardson, the patient's medical history, physical examination, diagnosis and results of pertinent tests and treatment and I agree with the patient's care as documented.  

## 2014-11-23 ENCOUNTER — Other Ambulatory Visit: Payer: Self-pay | Admitting: *Deleted

## 2014-11-23 DIAGNOSIS — M5030 Other cervical disc degeneration, unspecified cervical region: Secondary | ICD-10-CM

## 2014-11-23 NOTE — Telephone Encounter (Signed)
Last refill per Pharmacy 4/13 last office visit 08/25/14 Last UDS 2013

## 2014-11-24 MED ORDER — HYDROCODONE-ACETAMINOPHEN 5-325 MG PO TABS: 1.0000 | ORAL_TABLET | Freq: Every day | ORAL | Status: DC | PRN

## 2014-12-19 ENCOUNTER — Other Ambulatory Visit: Payer: Self-pay | Admitting: Internal Medicine

## 2015-01-24 ENCOUNTER — Other Ambulatory Visit: Payer: Self-pay

## 2015-01-24 NOTE — Telephone Encounter (Signed)
Patient is calling for refill of pain medication

## 2015-01-24 NOTE — Telephone Encounter (Signed)
Patient is calling for pain medication

## 2015-01-24 NOTE — Telephone Encounter (Signed)
Pt has Rx on file.   He has been informed

## 2015-03-26 ENCOUNTER — Other Ambulatory Visit: Payer: Self-pay

## 2015-03-26 DIAGNOSIS — M5030 Other cervical disc degeneration, unspecified cervical region: Secondary | ICD-10-CM

## 2015-03-26 NOTE — Telephone Encounter (Signed)
Patient needs pain medication

## 2015-03-27 MED ORDER — HYDROCODONE-ACETAMINOPHEN 5-325 MG PO TABS: 1.0000 | ORAL_TABLET | Freq: Every day | ORAL | Status: DC | PRN

## 2015-03-27 MED ORDER — HYDROCODONE-ACETAMINOPHEN 5-325 MG PO TABS
1.0000 | ORAL_TABLET | Freq: Every day | ORAL | Status: DC | PRN
Start: 2015-03-27 — End: 2015-06-27

## 2015-03-27 NOTE — Telephone Encounter (Signed)
Last refill 8/14

## 2015-03-28 NOTE — Telephone Encounter (Signed)
Pt aware.Despina Hidden Cassady9/14/20169:40 AM

## 2015-04-18 ENCOUNTER — Ambulatory Visit (INDEPENDENT_AMBULATORY_CARE_PROVIDER_SITE_OTHER): Payer: Self-pay | Admitting: Internal Medicine

## 2015-04-18 ENCOUNTER — Encounter: Payer: Self-pay | Admitting: Internal Medicine

## 2015-04-18 VITALS — BP 194/85 | HR 82 | Temp 98.6°F | Ht 65.0 in | Wt 163.7 lb

## 2015-04-18 DIAGNOSIS — F17210 Nicotine dependence, cigarettes, uncomplicated: Secondary | ICD-10-CM

## 2015-04-18 DIAGNOSIS — Z299 Encounter for prophylactic measures, unspecified: Secondary | ICD-10-CM

## 2015-04-18 DIAGNOSIS — E785 Hyperlipidemia, unspecified: Secondary | ICD-10-CM

## 2015-04-18 DIAGNOSIS — F17200 Nicotine dependence, unspecified, uncomplicated: Secondary | ICD-10-CM

## 2015-04-18 DIAGNOSIS — I10 Essential (primary) hypertension: Secondary | ICD-10-CM

## 2015-04-18 MED ORDER — AMLODIPINE BESYLATE 10 MG PO TABS: 10.0000 mg | ORAL_TABLET | Freq: Every day | ORAL | Status: DC

## 2015-04-18 MED ORDER — CARVEDILOL 25 MG PO TABS: 25.0000 mg | ORAL_TABLET | Freq: Two times a day (BID) | ORAL | Status: DC

## 2015-04-18 MED ORDER — LOSARTAN POTASSIUM 100 MG PO TABS: 100.0000 mg | ORAL_TABLET | Freq: Every day | ORAL | Status: DC

## 2015-04-18 MED ORDER — ROSUVASTATIN CALCIUM 10 MG PO TABS: 10.0000 mg | ORAL_TABLET | Freq: Every day | ORAL | Status: DC

## 2015-04-18 NOTE — Assessment & Plan Note (Signed)
Patient will get the flu shot at his follow up visit.

## 2015-04-18 NOTE — Patient Instructions (Signed)
TAKE AMLODIPINE 10 MG DAILY. TAKE CARVEDILOL 25 MG TWICE A DAY. TAKE LOSARTAN 100 MG ONCE A DAY.  FOLLOW UP 2-4 WEEKS FOR BLOOD PRESSURE RECHECK.   PLEASE BRING YOUR BLOOD PRESSURE CUFF.

## 2015-04-18 NOTE — Assessment & Plan Note (Signed)
BP Readings from Last 3 Encounters:  04/18/15 194/85  08/25/14 158/83  05/24/14 172/80    Lab Results  Component Value Date   NA 142 12/22/2013   K 4.2 12/22/2013   CREATININE 1.01 12/22/2013    Assessment: Blood pressure control:  Uncontrolled Progress toward BP goal:   Deteriorated  Comments: BP today is 194/85. Patient denies any headache, chest pain, shortness of breath, blurry vision. He states he has not taken his medications in 3 days, but has been compliant with amlodipine 10 mg daily, coreg  12.5 mg BID, and losartan 100 mg daily. However, per his prescription refills, it seems that he should have run out of his medications. He has a BP cuff at home and states he normally runs in the 150s/90s. This is  consistent with his last BP reading in February.   Plan: Medications:  Increased Coreg to 25 mg BID. Continue amlodipine and losartan. Instructed patient to go home and take his medications today.  Other plans: Follow up 2 weeks to recheck BP and compare our readings with his BP cuff readings. Refilled all medications to ensure compliance.

## 2015-04-18 NOTE — Assessment & Plan Note (Signed)
  Assessment: Progress toward smoking cessation:   Desire Barriers to progress toward smoking cessation:   Doesn't want to quit Comments: Patient is still smoking 1/2 ppd. He has no desire to quit at this time. He denies any cough or hemoptysis. He previously tried Chantix in the past and developed paranoid thoughts.   Plan: Instruction/counseling given:  I counseled patient on the dangers of tobacco use, advised patient to stop smoking, and reviewed strategies to maximize success. Medications to assist with smoking cessation:  Discussed Bupropion (Zyban)

## 2015-04-18 NOTE — Progress Notes (Signed)
Medicine attending: Medical history, presenting problems, physical findings, and medications, reviewed with Dr Alexa Richardson and I concur with her evaluation and management plan. 

## 2015-04-18 NOTE — Progress Notes (Signed)
Subjective:    Patient ID: Devon Vega, male    DOB: 1955-09-04, 59 y.o.   MRN: [id number]  HPI Devon Vega is a 59 y.o. male with PMHx of HTN who presents to the clinic for follow up for HTN. Please see A&P for the status of the patient's chronic medical problems.   BP today is 194/85. Patient denies any headache, chest pain, shortness of breath, blurry vision. He states he has not taken his medications in 3 days, but has been compliant with amlodipine 10 mg daily, coreg 12.5 mg BID, and losartan 100 mg daily. However, per his prescription refills, it seems that he should have run out of his medications. He has a BP cuff at home and states he normally runs in the 150s/90s. This is consistent with his last BP reading in February.   Patient is still smoking 1/2 ppd. He has no desire to quit at this time. He denies any cough or hemoptysis. He previously tried Chantix in the past and developed paranoid thoughts.    Past Medical History  Diagnosis Date  . Alcohol abuse     hx of , sober since 1997  . Tobacco abuse     failed zyban  . 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 Weight gain   . Hyperlipidemia   . Hypertension   . Sinus arrhythmia 05/24/2014    Outpatient Encounter Prescriptions as of 04/18/2015  Medication Sig  . amLODipine (NORVASC) 10 MG tablet Take 1 tablet (10 mg total) by mouth daily.  . carvedilol (COREG) 25 MG tablet Take 1 tablet (25 mg total) by mouth 2 (two) times daily with a meal.  . HYDROcodone-acetaminophen (NORCO/VICODIN) 5-325 MG per tablet Take 1 tablet by mouth daily as needed.  Marland Kitchen HYDROcodone-acetaminophen (NORCO/VICODIN) 5-325 MG per tablet Take 1 tablet by mouth daily as needed.  Marland Kitchen HYDROcodone-acetaminophen (NORCO/VICODIN) 5-325 MG per tablet Take 1 tablet by mouth daily as needed.  Marland Kitchen losartan (COZAAR) 100 MG tablet Take 1 tablet (100 mg total) by mouth daily.  . rosuvastatin (CRESTOR) 10 MG tablet Take 1 tablet (10 mg total) by mouth  daily.  . sildenafil (VIAGRA) 50 MG tablet Take 1 tablet (50 mg total) by mouth daily as needed for erectile dysfunction.  . [DISCONTINUED] amLODipine (NORVASC) 10 MG tablet Take 1 tablet (10 mg total) by mouth daily.  . [DISCONTINUED] carvedilol (COREG) 12.5 MG tablet Take 1 tablet (12.5 mg total) by mouth 2 (two) times daily with a meal.  . [DISCONTINUED] losartan (COZAAR) 100 MG tablet Take 1 tablet (100 mg total) by mouth daily.  . [DISCONTINUED] rosuvastatin (CRESTOR) 10 MG tablet Take 1 tablet (10 mg total) by mouth daily.   No facility-administered encounter medications on file as of 04/18/2015.    Family History  Problem Relation Age of Onset  . Cancer Father     Social History   Social History  . Marital Status: Divorced    Spouse Name: N/A  . Number of Children: N/A  . Years of Education: N/A   Occupational History  . Not on file.   Social History Main Topics  . Smoking status: Current Every Day Smoker -- 0.50 packs/day    Types: Cigarettes, Cigars  . Smokeless tobacco: Not on file  . Alcohol Use: 0.0 oz/week    0 Standard drinks or equivalent per week     Comment: 1 -2 beers on the w/e  . Drug Use: No  . Sexual  Activity: Not on file   Other Topics Concern  . Not on file   Social History Narrative   Works for [employer]. 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   Cardiovascular: Denies chest pain and palpitations.  Gastrointestinal: Denies nausea, vomiting, abdominal pain Neurological: Denies dizziness, headaches, weakness, lightheadedness     Objective:   Physical Exam Filed Vitals:   04/18/15 1517  BP: 194/85  Pulse: 82    Temp: 98.6 F (37 C)  TempSrc: Oral  Height: 5\' 5"  (1.651 m)  Weight: 163 lb 11.2 oz (74.254 kg)  SpO2: 100%   General: Vital signs reviewed.  Patient is well-developed and well-nourished, in no acute distress and cooperative with exam.  Cardiovascular: RRR, S1 normal, S2 normal, no murmurs, gallops, or rubs. Pulmonary/Chest: Clear to auscultation bilaterally, no wheezes, rales, or rhonchi. Abdominal: Soft, non-tender, non-distended, BS +  Extremities: No lower extremity edema bilaterally, pulses symmetric and intact bilaterally.  Neurological: A&O x3, cranial nerve II-XII are grossly intact Skin: Warm, dry and intact. No rashes or erythema. Psychiatric: Normal mood and affect. speech and behavior is normal. Cognition and memory are normal.      Assessment & Plan:   Please see problem oriented assessment and plan.

## 2015-06-25 ENCOUNTER — Other Ambulatory Visit: Payer: Self-pay

## 2015-06-25 DIAGNOSIS — M5030 Other cervical disc degeneration, unspecified cervical region: Secondary | ICD-10-CM

## 2015-06-25 NOTE — Telephone Encounter (Signed)
Last refills 9/13, 3 scripts Last appt 10/5, no future appts Last uds 05/2012

## 2015-06-25 NOTE — Telephone Encounter (Signed)
Pain medication 

## 2015-06-27 MED ORDER — HYDROCODONE-ACETAMINOPHEN 5-325 MG PO TABS: 1.0000 | ORAL_TABLET | Freq: Every day | ORAL | Status: DC | PRN

## 2015-06-27 NOTE — Telephone Encounter (Signed)
Refilled 3 prescriptions for Norco 5-325 mg QD prn #30.

## 2015-06-28 NOTE — Telephone Encounter (Signed)
rx ready for pick up, pt informed

## 2015-09-04 ENCOUNTER — Other Ambulatory Visit: Payer: Self-pay | Admitting: *Deleted

## 2015-09-04 DIAGNOSIS — I10 Essential (primary) hypertension: Secondary | ICD-10-CM

## 2015-09-04 MED ORDER — CARVEDILOL 25 MG PO TABS: 25.0000 mg | ORAL_TABLET | Freq: Two times a day (BID) | ORAL | Status: DC

## 2015-09-05 ENCOUNTER — Ambulatory Visit (INDEPENDENT_AMBULATORY_CARE_PROVIDER_SITE_OTHER): Payer: Self-pay | Admitting: Internal Medicine

## 2015-09-05 ENCOUNTER — Encounter: Payer: Self-pay | Admitting: Internal Medicine

## 2015-09-05 VITALS — BP 216/108 | HR 90 | Temp 98.9°F | Ht 65.0 in | Wt 163.0 lb

## 2015-09-05 DIAGNOSIS — M5030 Other cervical disc degeneration, unspecified cervical region: Secondary | ICD-10-CM

## 2015-09-05 DIAGNOSIS — R0981 Nasal congestion: Secondary | ICD-10-CM

## 2015-09-05 DIAGNOSIS — I498 Other specified cardiac arrhythmias: Secondary | ICD-10-CM

## 2015-09-05 DIAGNOSIS — I517 Cardiomegaly: Secondary | ICD-10-CM

## 2015-09-05 DIAGNOSIS — F17200 Nicotine dependence, unspecified, uncomplicated: Secondary | ICD-10-CM

## 2015-09-05 DIAGNOSIS — I10 Essential (primary) hypertension: Secondary | ICD-10-CM

## 2015-09-05 DIAGNOSIS — Z299 Encounter for prophylactic measures, unspecified: Secondary | ICD-10-CM

## 2015-09-05 DIAGNOSIS — F17210 Nicotine dependence, cigarettes, uncomplicated: Secondary | ICD-10-CM

## 2015-09-05 DIAGNOSIS — E785 Hyperlipidemia, unspecified: Secondary | ICD-10-CM

## 2015-09-05 MED ORDER — FLUTICASONE PROPIONATE 50 MCG/ACT NA SUSP: 2.0000 | Freq: Every day | NASAL | Status: DC

## 2015-09-05 MED ORDER — AMLODIPINE BESYLATE 10 MG PO TABS: 10.0000 mg | ORAL_TABLET | Freq: Every day | ORAL | Status: DC

## 2015-09-05 MED ORDER — CARVEDILOL 25 MG PO TABS: 25.0000 mg | ORAL_TABLET | Freq: Two times a day (BID) | ORAL | Status: DC

## 2015-09-05 NOTE — Assessment & Plan Note (Signed)
  Assessment: Progress toward smoking cessation:   Stagnant Barriers to progress toward smoking cessation:   Desire to quit Comments: 1/2 ppd  Plan: Instruction/counseling given:  I counseled patient on the dangers of tobacco use, advised patient to stop smoking, and reviewed strategies to maximize success. Educational resources provided:    Self management tools provided:    Medications to assist with smoking cessation:  None Patient agreed to the following self-care plans for smoking cessation: cut down the number of cigarettes smoked  Other plans: He denies a desire to quit at this time

## 2015-09-05 NOTE — Assessment & Plan Note (Addendum)
Patient has a history of Cervical DDD and has been on a pain contract since 2012 with Dr. Posey Pronto, receiving Norco 5-325 mg QHS #30 per month since 2012. He states his pain is very mild and describes it as dull. Pain used to be located in his neck with radiation to his left upper extremity. He denies any associated weakness. Patient has tried gabapentin in the past, but no longer wished to take it as it made him fatigued. I offered to restart it at bedtime for him as this would help with fatigue, but he does not want to try it. Patient also states he cannot urinate today for a UDS as he just urinated before he came here. I do not feel patient needs to be on chronic Norco anymore. We discussed beginning a taper for him and alternative methods to pain control (although his pain is mild, he may not need anything). We will initiate these changes at the follow up visit.  Plan: -Set taper regimen at follow up visit with me (PCP) -UDS at follow up visit (patient knows he needs to do this at follow up)

## 2015-09-05 NOTE — Assessment & Plan Note (Signed)
Patient reports compliance with Crestor 10 mg daily. He denies myalgias.  Plan: -Continue Crestor 10 mg daily

## 2015-09-05 NOTE — Patient Instructions (Signed)
PLEASE TAKE YOUR AMLODIPINE 10 MG DAILY. PLEASE TAKE YOUR CARVEDILOL ONE PILL TWICE A DAY.  RETURN TO THE CLINIC IN ONE WEEK FOR A BLOOD PRESSURE RECHECK. AT THAT TIME, WE MAY ADD BACK HYDROCHLOROTHIAZIDE.  IF YOU DEVELOP CHEST PAIN, SHORTNESS OF BREATH, HEADACHE, VISION CHANGES, WEAKNESS, SLURRED SPEECH, PLEASE GO TO THE EMERGENCY DEPARTMENT.  Hypertension Hypertension, commonly called high blood pressure, is when the force of blood pumping through your arteries is too strong. Your arteries are the blood vessels that carry blood from your heart throughout your body. A blood pressure reading consists of a higher number over a lower number, such as 110/72. The higher number (systolic) is the pressure inside your arteries when your heart pumps. The lower number (diastolic) is the pressure inside your arteries when your heart relaxes. Ideally you want your blood pressure below 120/80. Hypertension forces your heart to work harder to pump blood. Your arteries may become narrow or stiff. Having untreated or uncontrolled hypertension can cause heart attack, stroke, kidney disease, and other problems. RISK FACTORS Some risk factors for high blood pressure are controllable. Others are not.  Risk factors you cannot control include:   Race. You may be at higher risk if you are African American.  Age. Risk increases with age.  Gender. Men are at higher risk than women before age 89 years. After age 1, women are at higher risk than men. Risk factors you can control include:  Not getting enough exercise or physical activity.  Being overweight.  Getting too much fat, sugar, calories, or salt in your diet.  Drinking too much alcohol. SIGNS AND SYMPTOMS Hypertension does not usually cause signs or symptoms. Extremely high blood pressure (hypertensive crisis) may cause headache, anxiety, shortness of breath, and nosebleed. DIAGNOSIS To check if you have hypertension, your health care provider will  measure your blood pressure while you are seated, with your arm held at the level of your heart. It should be measured at least twice using the same arm. Certain conditions can cause a difference in blood pressure between your right and left arms. A blood pressure reading that is higher than normal on one occasion does not mean that you need treatment. If it is not clear whether you have high blood pressure, you may be asked to return on a different day to have your blood pressure checked again. Or, you may be asked to monitor your blood pressure at home for 1 or more weeks. TREATMENT Treating high blood pressure includes making lifestyle changes and possibly taking medicine. Living a healthy lifestyle can help lower high blood pressure. You may need to change some of your habits. Lifestyle changes may include:  Following the DASH diet. This diet is high in fruits, vegetables, and whole grains. It is low in salt, red meat, and added sugars.  Keep your sodium intake below 2,300 mg per day.  Getting at least 30-45 minutes of aerobic exercise at least 4 times per week.  Losing weight if necessary.  Not smoking.  Limiting alcoholic beverages.  Learning ways to reduce stress. Your health care provider may prescribe medicine if lifestyle changes are not enough to get your blood pressure under control, and if one of the following is true:  You are 45-72 years of age and your systolic blood pressure is above 140.  You are 96 years of age or older, and your systolic blood pressure is above 150.  Your diastolic blood pressure is above 90.  You have diabetes, and  your systolic blood pressure is over [id number] or your diastolic blood pressure is over 90.  You have kidney disease and your blood pressure is above 140/90.  You have heart disease and your blood pressure is above 140/90. Your personal target blood pressure may vary depending on your medical conditions, your age, and other factors. HOME CARE  INSTRUCTIONS  Have your blood pressure rechecked as directed by your health care provider.   Take medicines only as directed by your health care provider. Follow the directions carefully. Blood pressure medicines must be taken as prescribed. The medicine does not work as well when you skip doses. Skipping doses also puts you at risk for problems.  Do not smoke.   Monitor your blood pressure at home as directed by your health care provider. SEEK MEDICAL CARE IF:   You think you are having a reaction to medicines taken.  You have recurrent headaches or feel dizzy.  You have swelling in your ankles.  You have trouble with your vision. SEEK IMMEDIATE MEDICAL CARE IF:  You develop a severe headache or confusion.  You have unusual weakness, numbness, or feel faint.  You have severe chest or abdominal pain.  You vomit repeatedly.  You have trouble breathing. MAKE SURE YOU:   Understand these instructions.  Will watch your condition.  Will get help right away if you are not doing well or get worse.   This information is not intended to replace advice given to you by your health care provider. Make sure you discuss any questions you have with your health care provider.   Document Released: 06/30/2005 Document Revised: 11/14/2014 Document Reviewed: 04/22/2013 Elsevier Interactive Patient Education [employer].

## 2015-09-05 NOTE — Assessment & Plan Note (Signed)
Patient complains of one week of nasal congestion and ear popping. He denies headache, sinus pressure, sore throat or cough. He has not tried anything for his symptoms. Nasal turbinates are erythematous and edematous.   Dx: Acute Viral Sinusitis   Plan: Flonase 2 sprays BID

## 2015-09-05 NOTE — Assessment & Plan Note (Signed)
Patient is due for colonoscopy in 2020.  He declines HIV or Hepatitis C testing at this time.

## 2015-09-05 NOTE — Assessment & Plan Note (Signed)
BP Readings from Last 3 Encounters:  09/05/15 216/108  04/18/15 194/85  08/25/14 158/83    Lab Results  Component Value Date   NA 142 12/22/2013   K 4.2 12/22/2013   CREATININE 1.01 12/22/2013    Assessment: Blood pressure control:  Uncontrolled Progress toward BP goal:   Deteriorated Comments: Patient is chronically hypertension due to underlying essential hypertension and medication non-compliance. Patient states he is no longer taking losartan 100 mg daily due to cough. He still takes amlodipine 10 mg daily and carvedilol 25 mg BID, but has not taken these medications in 3-4 days. He admits he is very agitated today as the [employer] just told him that he owes $5000. He denies chest pain, shortness of breath, headache, blurry vision. Lungs are clear on examination and patient has a non-focal exam with 5/5 strength in upper and lower extremities.   Diagnosis: Chronic, Uncontrolled, Stage 2 Hypertension  Plan: Medications:  Restart amlodipine 10 mg daily and carvedilol 25 mg BID. Emphasized importance of compliance and the risks of stroke and heart attack with uncontrolled HTN. Return to clinic in one week for BP recheck. Other plans: Blood pressure lowering should be gradual as patient is chronically hypertensive. Consider addition of HCTZ at follow up visit. Patient has HCTZ listed as an allergy (impotence), but he is willing to retry it.

## 2015-09-05 NOTE — Progress Notes (Signed)
Subjective:    Patient ID: Devon Vega, male    DOB: [birth year], 60 y.o.   MRN: [id number]  HPI Devon Vega is a 60 y.o. male with PMHx of HTN, HLD, Tobacco Abuse, Cervical DDD who presents to the clinic for follow up for his HTN. Please see A&P for the status of the patient's chronic medical problems.   Past Medical History  Diagnosis Date  . Alcohol abuse     hx of , sober since 1997  . Tobacco abuse     failed zyban  . 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 Weight gain   . Hyperlipidemia   . Hypertension   . Sinus arrhythmia 05/24/2014    Outpatient Encounter Prescriptions as of 09/05/2015  Medication Sig  . amLODipine (NORVASC) 10 MG tablet Take 1 tablet (10 mg total) by mouth daily.  . carvedilol (COREG) 25 MG tablet Take 1 tablet (25 mg total) by mouth 2 (two) times daily with a meal.  . fluticasone (FLONASE) 50 MCG/ACT nasal spray Place 2 sprays into both nostrils daily.  Marland Kitchen HYDROcodone-acetaminophen (NORCO/VICODIN) 5-325 MG tablet Take 1 tablet by mouth daily as needed.  Marland Kitchen HYDROcodone-acetaminophen (NORCO/VICODIN) 5-325 MG tablet Take 1 tablet by mouth daily as needed.  Marland Kitchen HYDROcodone-acetaminophen (NORCO/VICODIN) 5-325 MG tablet Take 1 tablet by mouth daily as needed.  . rosuvastatin (CRESTOR) 10 MG tablet Take 1 tablet (10 mg total) by mouth daily.  . [DISCONTINUED] amLODipine (NORVASC) 10 MG tablet Take 1 tablet (10 mg total) by mouth daily.  . [DISCONTINUED] carvedilol (COREG) 25 MG tablet Take 1 tablet (25 mg total) by mouth 2 (two) times daily with a meal.  . [DISCONTINUED] losartan (COZAAR) 100 MG tablet Take 1 tablet (100 mg total) by mouth daily.  . [DISCONTINUED] sildenafil (VIAGRA) 50 MG tablet Take 1 tablet (50 mg total) by mouth daily as needed for erectile dysfunction.   No facility-administered encounter medications on file as of 09/05/2015.    Family History  Problem Relation Age of Onset  . Cancer Father     Social History    Social History  . Marital Status: Divorced    Spouse Name: N/A  . Number of Children: N/A  . Years of Education: N/A   Occupational History  . Not on file.   Social History Main Topics  . Smoking status: Current Every Day Smoker -- 0.50 packs/day    Types: Cigarettes, Cigars  . Smokeless tobacco: Not on file  . Alcohol Use: 0.0 oz/week    0 Standard drinks or equivalent per week     Comment: 1 -2 beers on the w/e  . Drug Use: No  . Sexual Activity: Not on file   Other Topics Concern  . Not on file   Social History Narrative   Works for [employer]. 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 Denies vision changes. Respiratory: Denies SOB, cough, DOE, chest tightness   Cardiovascular: Denies chest pain and palpitations.  Gastrointestinal: Denies nausea, vomiting, abdominal pain Musculoskeletal: Admits to chronic neck pain with radiation to his LUE.  Neurological: Denies dizziness, headaches, weakness, lightheadedness, numbness Psychiatric/Behavioral: Admits to agitation.  Objective:   Physical Exam Filed Vitals:   09/05/15 1453  BP: 216/108  Pulse: 90  Temp: 98.9 F (37.2 C)  TempSrc: Oral  Height: 5\' 5"  (1.651 m)  Weight: 163 lb (73.936 kg)  SpO2: 100%   General: Vital signs reviewed.  Patient is well-developed and well-nourished, appears younger than stated age, in no acute distress and cooperative with exam.  Eyes: EOMI, conjunctivae normal, PERRLA. Ears: Normal tympanic membranes bilaterally. Nose: Erythematous, edematous nasal turbinates.   Cardiovascular: Regular rate, irregular rhythm, S1 normal, S2 normal. Pulmonary/Chest: Clear to auscultation bilaterally, no wheezes,  rales, or rhonchi. Abdominal: Soft, non-tender, non-distended, BS + Musculoskeletal: No neck tenderness on palpation. Extremities: No lower extremity edema bilaterally Neurological: A&O x3, Strength is normal and symmetric bilaterally, no focal motor deficit     Assessment & Plan:   Please see problem based assessment and plan.

## 2015-09-05 NOTE — Assessment & Plan Note (Signed)
Patient continues to be in an irregular rhythm, previously diagnosed as sinus arrhythmia. He has never been told that he as atrial fibrillation. Last EKG was in 2015.   Plan: -Repeat EKG at follow up visit

## 2015-09-06 LAB — BMP8+ANION GAP
Anion Gap: 18 mmol/L (ref 10.0–18.0)
BUN/Creatinine Ratio: 10 (ref 10–22)
BUN: 10 mg/dL (ref 8–27)
CO2: 24 mmol/L (ref 18–29)
Calcium: 9.5 mg/dL (ref 8.6–10.2)
Chloride: 104 mmol/L (ref 96–106)
Creatinine, Ser: 1.01 mg/dL (ref 0.76–1.27)
GFR calc Af Amer: 92 mL/min/{1.73_m2} (ref >59)
GFR calc non Af Amer: 79 mL/min/{1.73_m2} (ref >59)
Glucose: 93 mg/dL (ref 65–99)
Potassium: 4.1 mmol/L (ref 3.5–5.2)
Sodium: 146 mmol/L — ABNORMAL HIGH (ref 134–144)

## 2015-09-06 NOTE — Progress Notes (Signed)
Internal Medicine Clinic Attending  Case discussed with Dr. Richardson at the time of the visit.  We reviewed the resident's history and exam and pertinent patient test results.  I agree with the assessment, diagnosis, and plan of care documented in the resident's note. 

## 2015-09-06 NOTE — Addendum Note (Signed)
Addended by: Gilles Chiquito B on: 09/06/2015 02:09 PM   Modules accepted: Level of Service

## 2015-09-12 ENCOUNTER — Ambulatory Visit (INDEPENDENT_AMBULATORY_CARE_PROVIDER_SITE_OTHER): Payer: Self-pay | Admitting: Internal Medicine

## 2015-09-12 ENCOUNTER — Encounter: Payer: Self-pay | Admitting: Internal Medicine

## 2015-09-12 VITALS — BP 206/93 | HR 76 | Temp 98.4°F | Resp 18 | Ht 65.0 in | Wt 164.9 lb

## 2015-09-12 DIAGNOSIS — I10 Essential (primary) hypertension: Secondary | ICD-10-CM

## 2015-09-12 MED ORDER — HYDROCHLOROTHIAZIDE 12.5 MG PO TABS: 25.0000 mg | ORAL_TABLET | Freq: Every day | ORAL | Status: DC

## 2015-09-12 NOTE — Progress Notes (Signed)
   Subjective:    Patient ID: Devon Vega, male    DOB: 1956-05-23, 60 y.o.   MRN: [id number]  HPI  60 yo male with HTN, HLD, LVH, tobacco abuse, presented for follow up of his HTN.  HTN: currently taking amlodipine 10mg  daily + coreg 25mg  BID. Has been compliant with this on a daily basis. Avoiding salt intake. BP remains high today. Denies any blurry vision, chest pain, sob, headache, or any other symptoms currently. Had some cough thought to be from losartan in the past. HCTZ caused "impotence" but patient is willing to try it again.   Review of Systems  Constitutional: Negative for fever, chills and fatigue.  HENT: Negative for congestion and sore throat.   Eyes: Negative for photophobia, pain and visual disturbance.  Respiratory: Negative for cough, chest tightness and shortness of breath.   Cardiovascular: Negative for chest pain, palpitations and leg swelling.  Gastrointestinal: Negative for nausea, vomiting and abdominal pain.  Endocrine: Negative.   Genitourinary: Negative for dysuria and hematuria.  Musculoskeletal: Negative.   Skin: Negative.   Neurological: Negative for dizziness and headaches.  Hematological: Negative.   Psychiatric/Behavioral: Negative.        Objective:   Physical Exam  Constitutional: He is oriented to person, place, and time. He appears well-developed and well-nourished. No distress.  HENT:  Head: Normocephalic and atraumatic.  Eyes: EOM are normal. Pupils are equal, round, and reactive to light.  Neck: Normal range of motion. Neck supple.  Cardiovascular: Normal rate and regular rhythm.  Exam reveals no gallop and no friction rub.   No murmur heard. Pulmonary/Chest: Effort normal and breath sounds normal. No respiratory distress. He has no wheezes. He has no rales.  Abdominal: Soft. Bowel sounds are normal. He exhibits no distension. There is no tenderness. There is no rebound.  Musculoskeletal: Normal range of motion. He exhibits no  edema or tenderness.  Neurological: He is alert and oriented to person, place, and time. No cranial nerve deficit. Coordination normal.  Skin: He is not diaphoretic.    Filed Vitals:   09/12/15 1533 09/12/15 1545  BP: 198/87 206/93  Pulse: 76   Temp: 98.4 F (36.9 C)   Resp: 18          Assessment & Plan:  See problem based a&p.

## 2015-09-12 NOTE — Patient Instructions (Signed)
Start taking hydrochlorothiazide daily. If you have any problem with this please let us know.  Follow up in 1 month for blood pressure recheck.

## 2015-09-12 NOTE — Assessment & Plan Note (Signed)
Filed Vitals:   09/12/15 1533 09/12/15 1545  BP: 198/87 206/93  Pulse: 76   Temp: 98.4 F (36.9 C)   Resp: 18     BP remains uncontrolled on current medication. Already on max dose of coreg and amlodipine. May benefit from diuretic. Wants to try HCTZ again. Will start him on 25mg  daily HCTZ, continue amlodipine 10mg  dailly + coreg 25mg  BID.  He had cough with losartan in the past but that's unlikely to cause cough so this may be another option for him to retry in the future if HCTZ causes any problem. He does mention having some snoring at night. Told him to discuss getting a sleep study to r/o sleep apnea as a cause of uncontrolled HTN in the future. Also discussed watching salt intake, losing some weight (he is slightly overweight), and exercising.   F/up with pcp in 1 month.

## 2015-09-14 NOTE — Progress Notes (Signed)
Internal Medicine Clinic Attending  Case discussed with Dr. Ahmed at the time of the visit.  We reviewed the resident's history and exam and pertinent patient test results.  I agree with the assessment, diagnosis, and plan of care documented in the resident's note. 

## 2015-09-24 ENCOUNTER — Other Ambulatory Visit: Payer: Self-pay

## 2015-09-24 DIAGNOSIS — M5030 Other cervical disc degeneration, unspecified cervical region: Secondary | ICD-10-CM

## 2015-09-24 DIAGNOSIS — F1190 Opioid use, unspecified, uncomplicated: Secondary | ICD-10-CM

## 2015-09-24 NOTE — Telephone Encounter (Signed)
Pain medication refill 

## 2015-09-24 NOTE — Telephone Encounter (Signed)
Last refill 3/3 given 06/27/15 Last OV 09/12/15 UDS 2013   LVM for patient to advise we had sent request to PCP

## 2015-09-25 ENCOUNTER — Other Ambulatory Visit: Payer: Self-pay | Admitting: Internal Medicine

## 2015-09-28 ENCOUNTER — Other Ambulatory Visit: Payer: Self-pay | Admitting: Internal Medicine

## 2015-09-28 ENCOUNTER — Other Ambulatory Visit: Payer: Self-pay

## 2015-09-28 DIAGNOSIS — M5030 Other cervical disc degeneration, unspecified cervical region: Secondary | ICD-10-CM

## 2015-09-28 DIAGNOSIS — F1190 Opioid use, unspecified, uncomplicated: Secondary | ICD-10-CM

## 2015-09-28 MED ORDER — HYDROCODONE-ACETAMINOPHEN 5-325 MG PO TABS: 1.0000 | ORAL_TABLET | Freq: Every day | ORAL | Status: DC | PRN

## 2015-09-28 NOTE — Telephone Encounter (Signed)
Pt came in obtained urine gave 1 script he stated it had been 3-4 days since pain med and 1 week since "pot"

## 2015-09-28 NOTE — Telephone Encounter (Signed)
Called patient to inform him that his pain medication prescription would be ready this afternoon for him to pick up. Patient was informed that he should leave a urine sample at this time as he was unable to do so before. Last UDS was 2013. Order has been placed for a future UDS. I am giving him a one month rx until we have the results of his UDS back and then, based on results, we can resume 3 months.   Martyn Malay, DO PGY-2 Internal Medicine Resident Pager # [phone] 09/28/2015 9:58 AM

## 2015-10-03 NOTE — Telephone Encounter (Signed)
Thank you for documenting his response.

## 2015-10-05 LAB — TOXASSURE SELECT,+ANTIDEPR,UR: PDF: 0

## 2015-10-29 ENCOUNTER — Other Ambulatory Visit: Payer: Self-pay

## 2015-10-29 DIAGNOSIS — M5030 Other cervical disc degeneration, unspecified cervical region: Secondary | ICD-10-CM

## 2015-10-29 NOTE — Telephone Encounter (Signed)
Patient would like refill on pain medication.  

## 2015-10-29 NOTE — Telephone Encounter (Signed)
Last OV, Refill, and UDS all on 3/17 No future appointments scheduled

## 2015-10-31 MED ORDER — HYDROCODONE-ACETAMINOPHEN 5-325 MG PO TABS: 1.0000 | ORAL_TABLET | Freq: Every day | ORAL | Status: DC | PRN

## 2015-10-31 NOTE — Telephone Encounter (Signed)
Rxs are ready to be picked up - pt called / informed.

## 2015-10-31 NOTE — Telephone Encounter (Signed)
Result discussed with [facility]. The fact that hydrocodone was not detected in not unexpected as patient had not taken any in 3-4 days. I had been late refilling this medication. Therefore, I will refill the medication, but he needs a repeat UDS soon.

## 2016-02-01 ENCOUNTER — Other Ambulatory Visit: Payer: Self-pay

## 2016-02-01 DIAGNOSIS — M5030 Other cervical disc degeneration, unspecified cervical region: Secondary | ICD-10-CM

## 2016-02-01 NOTE — Telephone Encounter (Signed)
Last vist 10/2015 No future appts Last script given 4/19, #3 Last uds 09/2015 but needs new one

## 2016-02-01 NOTE — Telephone Encounter (Signed)
Requesting pain medicine.

## 2016-02-04 MED ORDER — HYDROCODONE-ACETAMINOPHEN 5-325 MG PO TABS: 1.0000 | ORAL_TABLET | Freq: Every day | ORAL | 0 refills | Status: DC | PRN

## 2016-02-06 NOTE — Telephone Encounter (Signed)
Could you schedule this patient for UDS so we can continue to fill her pain meds. Thanks!

## 2016-02-07 ENCOUNTER — Other Ambulatory Visit: Payer: Self-pay

## 2016-02-07 DIAGNOSIS — M5030 Other cervical disc degeneration, unspecified cervical region: Secondary | ICD-10-CM

## 2016-02-07 NOTE — Telephone Encounter (Signed)
Pt aware need to pick up

## 2016-02-07 NOTE — Telephone Encounter (Signed)
Will need uds when he picks up

## 2016-02-19 LAB — TOXASSURE SELECT,+ANTIDEPR,UR: PDF: 0

## 2016-03-12 ENCOUNTER — Ambulatory Visit (INDEPENDENT_AMBULATORY_CARE_PROVIDER_SITE_OTHER): Payer: Self-pay | Admitting: Internal Medicine

## 2016-03-12 ENCOUNTER — Encounter: Payer: Self-pay | Admitting: Internal Medicine

## 2016-03-12 VITALS — BP 217/90 | HR 94 | Temp 97.5°F | Wt 164.3 lb

## 2016-03-12 DIAGNOSIS — I10 Essential (primary) hypertension: Secondary | ICD-10-CM

## 2016-03-12 DIAGNOSIS — Z79891 Long term (current) use of opiate analgesic: Secondary | ICD-10-CM

## 2016-03-12 DIAGNOSIS — Z299 Encounter for prophylactic measures, unspecified: Secondary | ICD-10-CM

## 2016-03-12 DIAGNOSIS — Z79899 Other long term (current) drug therapy: Secondary | ICD-10-CM

## 2016-03-12 DIAGNOSIS — M5030 Other cervical disc degeneration, unspecified cervical region: Secondary | ICD-10-CM

## 2016-03-12 MED ORDER — CARVEDILOL 25 MG PO TABS: 25.0000 mg | ORAL_TABLET | Freq: Two times a day (BID) | ORAL | 3 refills | Status: DC

## 2016-03-12 MED ORDER — HYDROCHLOROTHIAZIDE 12.5 MG PO TABS: 25.0000 mg | ORAL_TABLET | Freq: Every day | ORAL | 3 refills | Status: DC

## 2016-03-12 MED ORDER — HYDROCHLOROTHIAZIDE 25 MG PO TABS: 25.0000 mg | ORAL_TABLET | Freq: Every day | ORAL | 3 refills | Status: DC

## 2016-03-12 NOTE — Assessment & Plan Note (Addendum)
Patient has a history of chronic cervical DDD with chronic pain on Norco 5-325 mg one tablet QD. He has been on this regimen since 2012 and has a pain contract. He states the pain medication allows him to do things such as chores around the house and enjoy social activities. His [place] Database is as expected. Patient did not leave a UDS last visit as he states he couldn't urinate. Today, he originally states he already urinated, but later in the visit was able to provide a UDS. This may not show narcotics as patient last filled his rx on 7/27 for a one month supply. He understands I will not refill his medications until his UDS comes back. If no illicit drugs are seen, I will refill for a three month supply; however, patient will need to come back PRIOR to running out of his medications and bring both his bottle for a pill count and will need to leave another UDS at that time.   Plan: -UDS -Refill Norco accordingly

## 2016-03-12 NOTE — Assessment & Plan Note (Signed)
Patient refused flu vaccine.

## 2016-03-12 NOTE — Patient Instructions (Signed)
Follow up in 1 week for blood pressure recheck  Please return either tomorrow or at your one week follow up for a urine drug screen  Bring your pain pill bottle with you each time that you come  [place]

## 2016-03-12 NOTE — Progress Notes (Signed)
    CC: follow up for HTN  HPI: Devon Vega is a 60 y.o. male with PMHx of Uncontrolled Stage II HTN and Chronic DDD who presents to the clinic for follow up for Stage II HTN. Please see problem based assessment and plan for more information.  Patient is eating and drinking well. He denies chest pain, shortness of breath, headaches, lightheadedness or nausea.    Past Medical History:  Diagnosis Date  . Alcohol abuse    hx of , sober since 1997  . Hyperlipidemia   . Hypertension   . 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 Sinus arrhythmia 05/24/2014  . Tobacco abuse    failed zyban  . Weight gain     Review of Systems: Please see pertinent ROS reviewed in HPI and problem based charting.   Physical Exam: Vitals:   03/12/16 1450 03/12/16 1525  BP: (!) 227/105 (!) 217/90  Pulse: (!) 109 94  Temp: 97.5 F (36.4 C)   TempSrc: Oral   SpO2: 100%   Weight: 164 lb 4.8 oz (74.5 kg)    General: Vital signs reviewed.  Patient is well-developed and well-nourished, in no acute distress and cooperative with exam.  Neck: No carotid bruit present.  Cardiovascular: Tachycardic, regular rhythm, no murmurs, gallops, or rubs. Pulmonary/Chest: Clear to auscultation bilaterally, no wheezes, rales, or rhonchi. Abdominal: Soft, non-tender, non-distended, BS + Extremities: No lower extremity edema bilaterally, pulses symmetric and intact bilaterally.  Skin: Warm, dry and intact. No rashes or erythema. Psychiatric: Normal mood and affect. speech and behavior is normal. Cognition and memory are grossly normal.   Assessment & Plan:  See encounters tab for problem based medical decision making. Patient discussed with Dr. Evette Doffing

## 2016-03-12 NOTE — Assessment & Plan Note (Signed)
BP Readings from Last 3 Encounters:  03/12/16 (!) 217/90  09/12/15 (!) 206/93  09/05/15 (!) 216/108    Lab Results  Component Value Date   NA 146 (H) 09/05/2015   K 4.1 09/05/2015   CREATININE 1.01 09/05/2015    Assessment: Blood pressure control:  Severely uncontrolled Progress toward BP goal:   Deteriorated Comments: Patient is supposed to be on HCTZ 25 mg daily, amlodipine 10 mg daily, and Coreg 25 mg BID. He states he has not been taking his medications. When asked why, patient states he feels better when he doesn't take them and he doesn't like taking pills. Patient understands he is at risk for CVA and MI as we have discussed before. We also discussed that his body is not used to a lower blood pressure and that he will adapt as he continues to take the medications. Patient states he will start taking his medications.   Plan: Medications:  continue current medications Other plans: Follow up in 2 weeks

## 2016-03-13 ENCOUNTER — Telehealth: Payer: Self-pay

## 2016-03-13 NOTE — Telephone Encounter (Signed)
Devon Vega from [facility] requesting the nurse to call back regarding hydrochlorothiazide. Please call pt back.

## 2016-03-13 NOTE — Progress Notes (Signed)
Internal Medicine Clinic Attending  Case discussed with Dr. Burns soon after the resident saw the patient.  We reviewed the resident's history and exam and pertinent patient test results.  I agree with the assessment, diagnosis, and plan of care documented in the resident's note. 

## 2016-03-13 NOTE — Telephone Encounter (Signed)
Called [facility] - stated they received 2 rxs for HCTZ; 1 for 12.5 and 1 for 25 mg. I told them on the pt's med list, I only see HCTZ 25mg  daily.

## 2016-03-26 ENCOUNTER — Other Ambulatory Visit: Payer: Self-pay | Admitting: Internal Medicine

## 2016-03-26 ENCOUNTER — Other Ambulatory Visit: Payer: Self-pay

## 2016-03-26 DIAGNOSIS — F1190 Opioid use, unspecified, uncomplicated: Secondary | ICD-10-CM

## 2016-03-26 DIAGNOSIS — M5030 Other cervical disc degeneration, unspecified cervical region: Secondary | ICD-10-CM

## 2016-03-26 MED ORDER — HYDROCODONE-ACETAMINOPHEN 5-325 MG PO TABS: 1.0000 | ORAL_TABLET | Freq: Every day | ORAL | 0 refills | Status: DC | PRN

## 2016-03-26 MED ORDER — HYDROCODONE-ACETAMINOPHEN 5-325 MG PO TABS: 1.0000 | ORAL_TABLET | Freq: Four times a day (QID) | ORAL | 0 refills | Status: DC | PRN

## 2016-03-26 NOTE — Progress Notes (Signed)
Patient must come in for a UDS and BEFORE he runs out of his medications next time.  He cannot use any drugs including marijuana or I cannot refill his medications.  At his follow up appointment, he needs to bring in his pill bottle.

## 2016-03-26 NOTE — Telephone Encounter (Signed)
Last office visit: 03/12/2016 Last UDS: 02/07/2016 Last Refill: last picked up 02/07/2016 Next appt: none scheduled

## 2016-03-27 LAB — TOXASSURE SELECT,+ANTIDEPR,UR

## 2016-06-18 ENCOUNTER — Ambulatory Visit (INDEPENDENT_AMBULATORY_CARE_PROVIDER_SITE_OTHER): Payer: Self-pay | Admitting: Internal Medicine

## 2016-06-18 ENCOUNTER — Encounter (INDEPENDENT_AMBULATORY_CARE_PROVIDER_SITE_OTHER): Payer: Self-pay

## 2016-06-18 ENCOUNTER — Encounter: Payer: Self-pay | Admitting: Internal Medicine

## 2016-06-18 DIAGNOSIS — G8929 Other chronic pain: Secondary | ICD-10-CM

## 2016-06-18 DIAGNOSIS — Z79899 Other long term (current) drug therapy: Secondary | ICD-10-CM

## 2016-06-18 DIAGNOSIS — Z79891 Long term (current) use of opiate analgesic: Secondary | ICD-10-CM

## 2016-06-18 DIAGNOSIS — Z9114 Patient's other noncompliance with medication regimen: Secondary | ICD-10-CM

## 2016-06-18 DIAGNOSIS — F1120 Opioid dependence, uncomplicated: Secondary | ICD-10-CM

## 2016-06-18 DIAGNOSIS — F17210 Nicotine dependence, cigarettes, uncomplicated: Secondary | ICD-10-CM

## 2016-06-18 DIAGNOSIS — Z23 Encounter for immunization: Secondary | ICD-10-CM

## 2016-06-18 DIAGNOSIS — I10 Essential (primary) hypertension: Secondary | ICD-10-CM

## 2016-06-18 NOTE — Assessment & Plan Note (Addendum)
Patient was last seen in August 2017 with severely elevated BP in the 123456 systolic due to non-compliance with his medications- amlodipine 10 mg daily, HCTZ 25 mg daily and Coreg 25 mg BID. Patient was instructed to follow up in 2 weeks for BP recheck but never returned. BP remains severely elevated today at 195/96. He continues to admit to medication non-compliance. However, he denies chest pain, shortness of breath, headache, weakness, numbness, vision changes. I have repeatedly reviewed the risks of uncontrolled hypertension including MI, CVA, and aortic dissection. Patient seems to understand this but does not seem worried. Each time he tells me he will take his medication. He seems to fill and take his pain medication regularly however. I know that he is capable of taking these medications due to this.   I have instructed patient to return in one week for blood pressure recheck after staying compliant with HCTZ 25 mg daily, Amlodipine 10 mg daily, and Coreg 25 mg BID. If not improved despite compliance, I will add an additional agent. If uncontrolled and non-compliant, patient will no longer receive opioid refills from me. He would then be a candidate for dismissal from our clinic due to non-compliance.

## 2016-06-18 NOTE — Assessment & Plan Note (Addendum)
Devon Vega is on chronic opioid therapy for chronic pain. The date of the controlled substances contract is referenced in the [place] and / or the overview. Date of pain contract was October 2012. As part of the treatment plan, the [place] controlled substance database is checked at least twice yearly and the database results are appropriate. I have last reviewed the results on 06/18/16.   The last UDS was on 03/12/2016 and results are not as expected. Patient needs at least a yearly UDS. UDS obtained today.  The patient is on hydrocodone/acetaminophen (Lorcet, Lortab, Norco, Vicodin) strength 5-325 mg QD, 30 pills per 30 days. This regimen allows Devon Vega to function and does not cause excessive sedation or other side effects.  The benefits of continuing opioid therapy outweigh the risks and chronic opioids will be continued. Ongoing education about safe opioid treatment is provided. I have instructed patient to return in one week for blood pressure recheck after staying compliant with HCTZ 25 mg daily, Amlodipine 10 mg daily, and Coreg 25 mg BID. If uncontrolled and non-compliant, patient will no longer receive opioid refills from me. He would then be a candidate for dismissal from our clinic due to non-compliance. If UDS is unexpected, patient should no longer received opioid refills. If expected and compliant, I will refill his medications.  Interventions today include: UDS

## 2016-06-18 NOTE — Progress Notes (Signed)
Medicine attending: Medical history, presenting problems, physical findings, and medications, reviewed with resident physician Dr Alexa Burns on the day of the patient visit and I concur with her evaluation and management plan. 

## 2016-06-18 NOTE — Patient Instructions (Signed)
Please take all medications as prescribed, especially your blood pressure medications. Please return in ONE week for a blood pressure recheck. We will also refill your hydrocodone at that time if your Urine Drug Screen is acceptable.   If you develop any chest pain, shortness of breath, vision changes, weakness, numbness or slurred speech please go the the Emergency Department.

## 2016-06-18 NOTE — Progress Notes (Signed)
    CC: Follow up for HTN  HPI: [name] is a 60 y.o. male with PMHx of Stage II Uncontrolled HTN and chronic pain who presents to the clinic for follow up for HTN.  Patient was last seen in August 2017 with severely elevated BP in the 123456 systolic due to non-compliance with his medications- amlodipine 10 mg daily, HCTZ 25 mg daily and Coreg 25 mg BID. Patient was instructed to follow up in 2 weeks for BP recheck but never returned. BP remains severely elevated today. However, he denies chest pain, shortness of breath, headache, weakness, numbness, vision changes.   Devon Vega is on chronic opioid therapy for chronic pain. The date of the controlled substances contract is referenced in the [place] and / or the overview. Date of pain contract was October 2012. As part of the treatment plan, the [place] controlled substance database is checked at least twice yearly and the database results are appropriate. I have last reviewed the results on 06/18/16.   The last UDS was on 03/12/2016 and results are not as expected. Patient needs at least a yearly UDS.   The patient is on hydrocodone/acetaminophen (Lorcet, Lortab, Norco, Vicodin) strength 5-325 mg QD, 30 pills per 30 days. This regimen allows Devon Vega to function and does not cause excessive sedation or other side effects.   Past Medical History:  Diagnosis Date  . Alcohol abuse    hx of , sober since 1997  . Hyperlipidemia   . Hypertension   . 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 Sinus arrhythmia 05/24/2014  . Tobacco abuse    failed zyban  . Weight gain     Review of Systems: Please see pertinent ROS reviewed in HPI and problem based charting.   Physical Exam: Vitals:   06/18/16 1546  BP: (!) 195/96  Pulse: (!) 108  Temp: 98.8 F (37.1 C)  TempSrc: Oral  SpO2: 100%  Weight: 164 lb 12.8 oz (74.8 kg)  Height: 5\' 5"  (1.651 m)   General: Vital signs reviewed.  Patient is well-developed and  well-nourished, in no acute distress and cooperative with exam.  Head: Normocephalic and atraumatic. Eyes: PERRLA, conjunctivae normal, no scleral icterus.  Neck: Supple, trachea midline, no  carotid bruit present.  Cardiovascular: RRR Pulmonary/Chest: Clear to auscultation bilaterally, no wheezes, rales, or rhonchi. Abdominal: Soft, non-tender, non-distended, BS + Extremities: No lower extremity edema bilaterally Skin: Warm, dry and intact.  Psychiatric: Normal mood and affect. speech and behavior is normal. Cognition and memory are normal.   Assessment & Plan:  See encounters tab for problem based medical decision making. Patient discussed with Dr. Beryle Beams

## 2016-06-24 ENCOUNTER — Telehealth: Payer: Self-pay | Admitting: Internal Medicine

## 2016-06-24 NOTE — Telephone Encounter (Signed)
APT. REMINDER CALL, LMTCB °

## 2016-06-25 ENCOUNTER — Ambulatory Visit (INDEPENDENT_AMBULATORY_CARE_PROVIDER_SITE_OTHER): Payer: Self-pay | Admitting: Internal Medicine

## 2016-06-25 ENCOUNTER — Encounter: Payer: Self-pay | Admitting: Internal Medicine

## 2016-06-25 VITALS — BP 174/97 | HR 84 | Temp 98.6°F | Ht 65.0 in | Wt 161.5 lb

## 2016-06-25 DIAGNOSIS — F1120 Opioid dependence, uncomplicated: Secondary | ICD-10-CM

## 2016-06-25 DIAGNOSIS — G8929 Other chronic pain: Secondary | ICD-10-CM

## 2016-06-25 DIAGNOSIS — F17210 Nicotine dependence, cigarettes, uncomplicated: Secondary | ICD-10-CM

## 2016-06-25 DIAGNOSIS — E7849 Other hyperlipidemia: Secondary | ICD-10-CM

## 2016-06-25 DIAGNOSIS — Z79891 Long term (current) use of opiate analgesic: Secondary | ICD-10-CM

## 2016-06-25 DIAGNOSIS — I10 Essential (primary) hypertension: Secondary | ICD-10-CM

## 2016-06-25 DIAGNOSIS — Z79899 Other long term (current) drug therapy: Secondary | ICD-10-CM

## 2016-06-25 LAB — TOXASSURE SELECT,+ANTIDEPR,UR

## 2016-06-25 MED ORDER — ROSUVASTATIN CALCIUM 10 MG PO TABS: 10.0000 mg | ORAL_TABLET | Freq: Every day | ORAL | 3 refills | Status: DC

## 2016-06-25 MED ORDER — HYDROCODONE-ACETAMINOPHEN 5-325 MG PO TABS: 1.0000 | ORAL_TABLET | Freq: Every day | ORAL | 0 refills | Status: DC | PRN

## 2016-06-25 MED ORDER — LOSARTAN POTASSIUM 50 MG PO TABS: 50.0000 mg | ORAL_TABLET | Freq: Every day | ORAL | 3 refills | Status: DC

## 2016-06-25 NOTE — Patient Instructions (Signed)
TAKE LOSARTAN 50 MG ONCE A DAY TAKE HYDROCHLOROTHIAZIDE 25 MG ONCE A DAY TAKE CARVEDILOL 25 MG TWICE A DAY TAKE ROSUVASTATIN ONCE A DAY TAKE AMLODIPINE 10 MG ONCE A DAY.  THE CARVEDILOL MUST BE TAKEN TWICE A DAY.  ALL OTHER MEDICINES ARE ONCE A DAY AND YOU CAN CHOOSE IF YOU TAKE THEM IN THE MORNING OR AT NIGHT.   FOLLOW UP PRIOR TO February 14th.

## 2016-06-25 NOTE — Assessment & Plan Note (Addendum)
  Patient was last seen one week ago with BP of 195/96 due to non-compliance with his medications- amlodipine 10 mg daily, HCTZ 25 mg daily and Coreg 25 mg BID. Patient was instructed to follow up in 1 weeks for BP recheck and was instructed on the importance with compliance with his medications. Today, he denies chest pain, shortness of breath, headache, weakness, numbness, vision changes. He reports compliance with the medications. His BP today is 174/97, improved from prior. I have instructed patient to return in two months for blood pressure recheck. Losartan 50 mg QD was started. If non-compliant, patient will no longer receive opioid refills from me. He would then be a candidate for dismissal from our clinic due to non-compliance.

## 2016-06-25 NOTE — Progress Notes (Signed)
    CC: Follow up for HTN  HPI: [name] is a 59 y.o. male with PMHx of Stage 2 Uncontrolled HTN and Cervical Disc Disease who presents to the clinic for follow up for HTN.  Patient was last seen one week ago with BP of 195/96 due to non-compliance with his medications- amlodipine 10 mg daily, HCTZ 25 mg daily and Coreg 25 mg BID. Patient was instructed to follow up in 1 weeks for BP recheck and was instructed on the importance with compliance with his medications. Today, he denies chest pain, shortness of breath, headache, weakness, numbness, vision changes. He reports compliance with the medications. His BP today is 174/97, improved from prior. I have instructed patient to return in two months for blood pressure recheck. Losartan 50 mg QD was started. If non-compliant, patient will no longer receive opioid refills from me. He would then be a candidate for dismissal from our clinic due to non-compliance.    Past Medical History:  Diagnosis Date  . Alcohol abuse    hx of , sober since 1997  . Hyperlipidemia   . Hypertension   . 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 Sinus arrhythmia 05/24/2014  . Tobacco abuse    failed zyban  . Weight gain     Review of Systems: Please see pertinent ROS reviewed in HPI and problem based charting.   Physical Exam: Vitals:   06/25/16 1410  BP: (!) 174/97  Pulse: 84  Temp: 98.6 F (37 C)  TempSrc: Oral  SpO2: 100%  Weight: 161 lb 8 oz (73.3 kg)  Height: 5\' 5"  (1.651 m)   General: Vital signs reviewed.  Patient is well-developed and well-nourished, in no acute distress and cooperative with exam.  Cardiovascular: RRR, S1 normal, S2 normal, no murmurs, gallops, or rubs. Pulmonary/Chest: Clear to auscultation bilaterally, no wheezes, rales, or rhonchi. Abdominal: Soft, non-tender, non-distended, BS + Skin: Warm, dry and intact.  Psychiatric: Normal mood and affect. speech and behavior is normal. Cognition and memory are  normal.   Assessment & Plan:  See encounters tab for problem based medical decision making. Patient discussed with Dr. Daryll Drown

## 2016-06-25 NOTE — Assessment & Plan Note (Addendum)
Devon Vega is on chronic opioid therapy for chronic pain. The date of the controlled substances contract is referenced in the [place] and / or the overview. Date of pain contract was 04/2011. As part of the treatment plan, the [place] controlled substance database is checked at least twice yearly and the database results are appropriate. I have last reviewed the results on 06/25/16.   The last UDS was on 06/18/16 and results are not as expected. There was Marijuana and Alcohol in his system. Patient needs at least a yearly UDS.   The patient is on hydrocodone/acetaminophen (Lorcet, Lortab, Norco, Vicodin) strength 5 mg QD, 30 per 30 days. This regimen allows Devon Vega to function and does not cause excessive sedation or other side effects.  "The benefits of continuing opioid therapy outweigh the risks and chronic opioids will be continued. Ongoing education about safe opioid treatment is provided However, patient cannot continue to use Marijuana and abuse alcohol. This has been discussed with him.   Interventions today include: 2 Refills provided Will need random UDS soon

## 2016-06-30 NOTE — Progress Notes (Signed)
Internal Medicine Clinic Attending  Case discussed with Dr. Burns soon after the resident saw the patient.  We reviewed the resident's history and exam and pertinent patient test results.  I agree with the assessment, diagnosis, and plan of care documented in the resident's note. 

## 2016-08-27 ENCOUNTER — Encounter (INDEPENDENT_AMBULATORY_CARE_PROVIDER_SITE_OTHER): Payer: Self-pay

## 2016-08-27 ENCOUNTER — Encounter: Payer: Self-pay | Admitting: Internal Medicine

## 2016-08-27 ENCOUNTER — Ambulatory Visit (INDEPENDENT_AMBULATORY_CARE_PROVIDER_SITE_OTHER): Payer: Self-pay | Admitting: Internal Medicine

## 2016-08-27 VITALS — BP 194/92 | HR 120 | Temp 98.4°F | Ht 65.0 in | Wt 164.3 lb

## 2016-08-27 DIAGNOSIS — Z79891 Long term (current) use of opiate analgesic: Secondary | ICD-10-CM

## 2016-08-27 DIAGNOSIS — I10 Essential (primary) hypertension: Secondary | ICD-10-CM

## 2016-08-27 DIAGNOSIS — F1120 Opioid dependence, uncomplicated: Secondary | ICD-10-CM

## 2016-08-27 DIAGNOSIS — F17210 Nicotine dependence, cigarettes, uncomplicated: Secondary | ICD-10-CM

## 2016-08-27 DIAGNOSIS — Z9114 Patient's other noncompliance with medication regimen: Secondary | ICD-10-CM

## 2016-08-27 DIAGNOSIS — Z79899 Other long term (current) drug therapy: Secondary | ICD-10-CM

## 2016-08-27 DIAGNOSIS — G8929 Other chronic pain: Secondary | ICD-10-CM

## 2016-08-27 MED ORDER — HYDROCODONE-ACETAMINOPHEN 5-325 MG PO TABS: 1.0000 | ORAL_TABLET | Freq: Four times a day (QID) | ORAL | 0 refills | Status: DC | PRN

## 2016-08-27 NOTE — Progress Notes (Signed)
    CC: follow-up for hypertension  HPI: [name] is a 61 y.o. male with PMHx of uncontrolled stage II hypertension due to noncompliance who presents to the clinic for hypertension.  Patient denies chest pain, shortness of breath, lower extremity swelling, syncope. He reports he has not taken any of his blood pressure medications in 3 days. He attributes this to side effects and feeling fatigued when he takes his medications, forgetfulness, and dislike of taking medications.  Past Medical History:  Diagnosis Date  . Alcohol abuse    hx of , sober since 1997  . Hyperlipidemia   . Hypertension   . 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 Sinus arrhythmia 05/24/2014  . Tobacco abuse    failed zyban  . Weight gain     Review of Systems: Please see pertinent ROS reviewed in HPI and problem based charting.   Physical Exam: Vitals:   08/27/16 1530  BP: (!) 194/92  Pulse: (!) 120  Temp: 98.4 F (36.9 C)  TempSrc: Oral  SpO2: 100%  Weight: 164 lb 4.8 oz (74.5 kg)  Height: 5\' 5"  (1.651 m)   General: Vital signs reviewed.  Patient is well-developed and well-nourished, in no acute distress and cooperative with exam.  Cardiovascular: RRR, S1 normal, S2 normal, no murmurs, gallops, or rubs. Pulmonary/Chest: Clear to auscultation bilaterally, no wheezes, rales, or rhonchi. Extremities: No lower extremity edema bilaterally Skin: Warm, dry and intact.  Psychiatric: Normal mood and affect. Pleasant, speech and behavior is normal. Cognition and memory are normal.   Assessment & Plan:  See encounters tab for problem based medical decision making. Patient discussed with Dr. Daryll Drown

## 2016-08-27 NOTE — Assessment & Plan Note (Signed)
BP Readings from Last 3 Encounters:  08/27/16 (!) 194/92  06/25/16 (!) 174/97  06/18/16 (!) 195/96    Lab Results  Component Value Date   NA 146 (H) 09/05/2015   K 4.1 09/05/2015   CREATININE 1.01 09/05/2015    Assessment: Blood pressure control:  uncontrolled Progress toward BP goal:   deteriorated Comments: Patient denies chest pain, shortness of breath, lower extremity swelling, syncope. He reports he has not taken any of his blood pressure medications in 3 days. He attributes this to side effects and feeling fatigued when he takes his medications, forgetfulness, and dislike of taking medications.  Plan: Medications:  continue current medications. Given instructions on how to slowly reinstate his blood pressure medications one at a time over the course of the week at a time to decrease side effects Other plans: patient was reeducated on his risk for stroke and heart attack given his chronically uncontrolled high blood pressure

## 2016-08-27 NOTE — Patient Instructions (Signed)
Mr. Hellmich,  I have refilled her pain medication for a one-month supply. We will test your urine today as well.we will not be able to continue to prescribe you opioid medications if her blood pressure remains uncontrolled, or your urine drug screen is inconsistent with our standards in the clinic.  Please remember to take her blood pressure medications. If you feel that taking all of them at one time makes you feel too fatigued, consider following the following regimen:  Week 1: Take hydrochlorothiazide 25 mg once a day  Week 2: Take hydrochlorothiazide 25 mg once a day and amlodipine 10 mg once a day Week 3:  take hydrochlorothiazide 25 mg once a day and amlodipine 10 mg once a day and losartan 50 mg once a day Week for: Take hydrochlorothiazide, amlodipine, losartan and Coreg 25 mg twice a day

## 2016-08-27 NOTE — Assessment & Plan Note (Signed)
Devon Vega is on chronic opioid therapy for chronic pain. The date of the controlled substances contract is referenced in the [place] and / or the overview. Date of pain contract was 04/2011. As part of the treatment plan, the [place] controlled substance database is checked at least twice yearly and the database results are appropriate. I have last reviewed the results on 08/27/16.   The last UDS was on 06/18/16 and results are not as expected. His UDS lacked his opioids, and had the presence of marijuana.   The patient is on hydrocodone/acetaminophen (Lorcet, Lortab, Norco, Vicodin) strength 5-325 mg QD prn, 30 per 30 days.   Interventions today include: UDS and Refill - 1 paper Rx printed  I have had an extensive discussion with this patient about his concerning UDS and noncompliance with his antihypertensives. If his urine drug screen shows the presence of marijuana or relax his pain medication we will discontinue his opioid prescriptions. If his blood pressure continues to remain elevated due to noncompliance we will discontinue his opioid prescriptions. Patient voices understanding.

## 2016-08-27 NOTE — Addendum Note (Signed)
Addended by: Martyn Malay R on: 08/27/2016 04:31 PM   Modules accepted: Orders

## 2016-09-03 LAB — TOXASSURE SELECT,+ANTIDEPR,UR

## 2016-09-11 NOTE — Progress Notes (Signed)
Internal Medicine Clinic Attending  Case discussed with Dr. Burns soon after the resident saw the patient.  We reviewed the resident's history and exam and pertinent patient test results.  I agree with the assessment, diagnosis, and plan of care documented in the resident's note. 

## 2016-09-12 ENCOUNTER — Encounter: Payer: Self-pay | Admitting: Internal Medicine

## 2016-10-21 ENCOUNTER — Telehealth: Payer: Self-pay | Admitting: Internal Medicine

## 2016-10-21 NOTE — Telephone Encounter (Signed)
Given patient's repeated inappropriate UDS and non-compliance with anti-hypertensive medications. I will no longer prescribe hydrocodone.  Martyn Malay, DO PGY-3 Internal Medicine Resident Pager # [phone] 10/21/2016 11:25 AM

## 2016-12-17 ENCOUNTER — Encounter: Payer: Self-pay | Admitting: *Deleted

## 2017-02-27 ENCOUNTER — Encounter (HOSPITAL_COMMUNITY): Payer: Self-pay | Admitting: Emergency Medicine

## 2017-02-27 ENCOUNTER — Observation Stay (HOSPITAL_BASED_OUTPATIENT_CLINIC_OR_DEPARTMENT_OTHER): Payer: Self-pay

## 2017-02-27 ENCOUNTER — Emergency Department (HOSPITAL_COMMUNITY): Payer: Self-pay

## 2017-02-27 ENCOUNTER — Inpatient Hospital Stay (HOSPITAL_COMMUNITY)
Admission: EM | Admit: 2017-02-27 | Discharge: 2017-03-02 | DRG: 286 | Disposition: A | Discharge status: Home / Self Care | Payer: Self-pay | Attending: Student in an Organized Health Care Education/Training Program | Admitting: Student in an Organized Health Care Education/Training Program

## 2017-02-27 DIAGNOSIS — I110 Hypertensive heart disease with heart failure: Principal | ICD-10-CM

## 2017-02-27 DIAGNOSIS — F1011 Alcohol abuse, in remission: Secondary | ICD-10-CM | POA: Diagnosis present

## 2017-02-27 DIAGNOSIS — I5043 Acute on chronic combined systolic (congestive) and diastolic (congestive) heart failure: Secondary | ICD-10-CM

## 2017-02-27 DIAGNOSIS — Z885 Allergy status to narcotic agent status: Secondary | ICD-10-CM

## 2017-02-27 DIAGNOSIS — I248 Other forms of acute ischemic heart disease: Secondary | ICD-10-CM | POA: Diagnosis present

## 2017-02-27 DIAGNOSIS — Z8679 Personal history of other diseases of the circulatory system: Secondary | ICD-10-CM

## 2017-02-27 DIAGNOSIS — F17210 Nicotine dependence, cigarettes, uncomplicated: Secondary | ICD-10-CM | POA: Diagnosis present

## 2017-02-27 DIAGNOSIS — I351 Nonrheumatic aortic (valve) insufficiency: Secondary | ICD-10-CM | POA: Diagnosis present

## 2017-02-27 DIAGNOSIS — Z888 Allergy status to other drugs, medicaments and biological substances status: Secondary | ICD-10-CM

## 2017-02-27 DIAGNOSIS — I509 Heart failure, unspecified: Secondary | ICD-10-CM

## 2017-02-27 DIAGNOSIS — I5020 Unspecified systolic (congestive) heart failure: Secondary | ICD-10-CM | POA: Diagnosis not present

## 2017-02-27 DIAGNOSIS — I161 Hypertensive emergency: Secondary | ICD-10-CM | POA: Diagnosis present

## 2017-02-27 DIAGNOSIS — J449 Chronic obstructive pulmonary disease, unspecified: Secondary | ICD-10-CM | POA: Diagnosis present

## 2017-02-27 DIAGNOSIS — I10 Essential (primary) hypertension: Secondary | ICD-10-CM | POA: Diagnosis present

## 2017-02-27 DIAGNOSIS — F17200 Nicotine dependence, unspecified, uncomplicated: Secondary | ICD-10-CM | POA: Diagnosis present

## 2017-02-27 DIAGNOSIS — J9601 Acute respiratory failure with hypoxia: Secondary | ICD-10-CM | POA: Diagnosis present

## 2017-02-27 DIAGNOSIS — I428 Other cardiomyopathies: Secondary | ICD-10-CM | POA: Diagnosis not present

## 2017-02-27 DIAGNOSIS — M5030 Other cervical disc degeneration, unspecified cervical region: Secondary | ICD-10-CM | POA: Diagnosis present

## 2017-02-27 DIAGNOSIS — I361 Nonrheumatic tricuspid (valve) insufficiency: Secondary | ICD-10-CM

## 2017-02-27 DIAGNOSIS — E785 Hyperlipidemia, unspecified: Secondary | ICD-10-CM

## 2017-02-27 DIAGNOSIS — Z79899 Other long term (current) drug therapy: Secondary | ICD-10-CM

## 2017-02-27 DIAGNOSIS — T465X6A Underdosing of other antihypertensive drugs, initial encounter: Secondary | ICD-10-CM | POA: Diagnosis present

## 2017-02-27 DIAGNOSIS — J810 Acute pulmonary edema: Secondary | ICD-10-CM

## 2017-02-27 DIAGNOSIS — I517 Cardiomegaly: Secondary | ICD-10-CM | POA: Diagnosis present

## 2017-02-27 DIAGNOSIS — N179 Acute kidney failure, unspecified: Secondary | ICD-10-CM

## 2017-02-27 HISTORY — DX: Chronic obstructive pulmonary disease, unspecified: J44.9

## 2017-02-27 LAB — RAPID URINE DRUG SCREEN, HOSP PERFORMED
Amphetamines: NOT DETECTED
Barbiturates: NOT DETECTED
Benzodiazepines: NOT DETECTED
Cocaine: NOT DETECTED
Opiates: NOT DETECTED
Tetrahydrocannabinol: POSITIVE — AB

## 2017-02-27 LAB — CBC WITH DIFFERENTIAL/PLATELET
Basophils Absolute: 0 10*3/uL (ref 0.0–0.1)
Basophils Relative: 0 %
Eosinophils Absolute: 0.2 10*3/uL (ref 0.0–0.7)
Eosinophils Relative: 2 %
HCT: 43.2 % (ref 39.0–52.0)
Hemoglobin: 13.9 g/dL (ref 13.0–17.0)
Lymphocytes Relative: 33 %
Lymphs Abs: 2.9 10*3/uL (ref 0.7–4.0)
MCH: 28.2 pg (ref 26.0–34.0)
MCHC: 32.2 g/dL (ref 30.0–36.0)
MCV: 87.6 fL (ref 78.0–100.0)
Monocytes Absolute: 0.5 10*3/uL (ref 0.1–1.0)
Monocytes Relative: 6 %
Neutro Abs: 5.4 10*3/uL (ref 1.7–7.7)
Neutrophils Relative %: 59 %
Platelets: 289 10*3/uL (ref 150–400)
RBC: 4.93 MIL/uL (ref 4.22–5.81)
RDW: 17.7 % — ABNORMAL HIGH (ref 11.5–15.5)
WBC: 9.1 10*3/uL (ref 4.0–10.5)

## 2017-02-27 LAB — URINALYSIS, ROUTINE W REFLEX MICROSCOPIC
Bacteria, UA: NONE SEEN
Bilirubin Urine: NEGATIVE
Glucose, UA: NEGATIVE mg/dL
Ketones, ur: NEGATIVE mg/dL
Leukocytes, UA: NEGATIVE
Nitrite: NEGATIVE
Protein, ur: 100 mg/dL — AB
Specific Gravity, Urine: 1.011 (ref 1.005–1.030)
Squamous Epithelial / LPF: NONE SEEN
pH: 5 (ref 5.0–8.0)

## 2017-02-27 LAB — COMPREHENSIVE METABOLIC PANEL
ALT: 31 U/L (ref 17–63)
AST: 73 U/L — ABNORMAL HIGH (ref 15–41)
Albumin: 3.7 g/dL (ref 3.5–5.0)
Alkaline Phosphatase: 94 U/L (ref 38–126)
Anion gap: 9 (ref 5–15)
BUN: 8 mg/dL (ref 6–20)
CO2: 23 mmol/L (ref 22–32)
Calcium: 8.6 mg/dL — ABNORMAL LOW (ref 8.9–10.3)
Chloride: 109 mmol/L (ref 101–111)
Creatinine, Ser: 1.51 mg/dL — ABNORMAL HIGH (ref 0.61–1.24)
GFR calc Af Amer: 55 mL/min — ABNORMAL LOW (ref >60)
GFR calc non Af Amer: 47 mL/min — ABNORMAL LOW (ref >60)
Glucose, Bld: 267 mg/dL — ABNORMAL HIGH (ref 65–99)
Potassium: 5.1 mmol/L (ref 3.5–5.1)
Sodium: 141 mmol/L (ref 135–145)
Total Bilirubin: 0.8 mg/dL (ref 0.3–1.2)
Total Protein: 6.5 g/dL (ref 6.5–8.1)

## 2017-02-27 LAB — TROPONIN I
Troponin I: 0.1 ng/mL (ref <0.03)
Troponin I: 0.16 ng/mL (ref <0.03)
Troponin I: 0.1 ng/mL (ref <0.03)

## 2017-02-27 LAB — HEMOGLOBIN A1C
Hgb A1c MFr Bld: 6 % — ABNORMAL HIGH (ref 4.8–5.6)
Mean Plasma Glucose: 125.5 mg/dL

## 2017-02-27 LAB — I-STAT TROPONIN, ED: Troponin i, poc: 0.07 ng/mL (ref 0.00–0.08)

## 2017-02-27 LAB — ETHANOL: Alcohol, Ethyl (B): <5 mg/dL (ref <5)

## 2017-02-27 LAB — ECHOCARDIOGRAM COMPLETE
Height: 65 in
Weight: 2640 oz

## 2017-02-27 LAB — T4, FREE: Free T4: 0.82 ng/dL (ref 0.61–1.12)

## 2017-02-27 LAB — MAGNESIUM: Magnesium: 2.2 mg/dL (ref 1.7–2.4)

## 2017-02-27 LAB — BRAIN NATRIURETIC PEPTIDE: B Natriuretic Peptide: 1066.9 pg/mL — ABNORMAL HIGH (ref 0.0–100.0)

## 2017-02-27 LAB — CK: Total CK: 492 U/L — ABNORMAL HIGH (ref 49–397)

## 2017-02-27 LAB — D-DIMER, QUANTITATIVE (NOT AT ARMC): D-Dimer, Quant: 0.63 ug/mL-FEU — ABNORMAL HIGH (ref 0.00–0.50)

## 2017-02-27 LAB — TSH: TSH: 2.482 u[IU]/mL (ref 0.350–4.500)

## 2017-02-27 MED ORDER — LORAZEPAM 2 MG/ML IJ SOLN
0.5000 mg | Freq: Once | INTRAMUSCULAR | Status: AC
  Administered 2017-02-27: 0.5 mg via INTRAVENOUS
  Filled 2017-02-27: qty 1

## 2017-02-27 MED ORDER — LABETALOL HCL 5 MG/ML IV SOLN
0.5000 mg/min | INTRAVENOUS | Status: DC
  Filled 2017-02-27: qty 100

## 2017-02-27 MED ORDER — FUROSEMIDE 10 MG/ML IJ SOLN
40.0000 mg | Freq: Once | INTRAMUSCULAR | Status: AC
  Administered 2017-02-27: 40 mg via INTRAVENOUS
  Filled 2017-02-27: qty 4

## 2017-02-27 MED ORDER — ACETAMINOPHEN 650 MG RE SUPP: 650.0000 mg | Freq: Four times a day (QID) | RECTAL | Status: DC | PRN

## 2017-02-27 MED ORDER — ASPIRIN EC 81 MG PO TBEC
81.0000 mg | DELAYED_RELEASE_TABLET | Freq: Every day | ORAL | Status: DC
  Administered 2017-02-27 – 2017-03-01 (×3): 81 mg via ORAL
  Filled 2017-02-27 (×4): qty 1

## 2017-02-27 MED ORDER — ROSUVASTATIN CALCIUM 10 MG PO TABS
10.0000 mg | ORAL_TABLET | Freq: Every day | ORAL | Status: DC
  Administered 2017-02-27 – 2017-03-01 (×3): 10 mg via ORAL
  Filled 2017-02-27 (×5): qty 1

## 2017-02-27 MED ORDER — GUAIFENESIN ER 600 MG PO TB12
600.0000 mg | ORAL_TABLET | Freq: Two times a day (BID) | ORAL | Status: DC | PRN
  Administered 2017-02-27: 600 mg via ORAL
  Filled 2017-02-27: qty 1

## 2017-02-27 MED ORDER — NICARDIPINE HCL IN NACL 20-0.86 MG/200ML-% IV SOLN
INTRAVENOUS | Status: AC
  Administered 2017-02-27: 7.5 mg/h via INTRAVENOUS
  Filled 2017-02-27: qty 200

## 2017-02-27 MED ORDER — ENOXAPARIN SODIUM 40 MG/0.4ML ~~LOC~~ SOLN
40.0000 mg | SUBCUTANEOUS | Status: DC
  Administered 2017-02-27 – 2017-03-01 (×3): 40 mg via SUBCUTANEOUS
  Filled 2017-02-27 (×3): qty 0.4

## 2017-02-27 MED ORDER — ACETAMINOPHEN 325 MG PO TABS: 650.0000 mg | ORAL_TABLET | Freq: Four times a day (QID) | ORAL | Status: DC | PRN

## 2017-02-27 MED ORDER — NICARDIPINE HCL IN NACL 20-0.86 MG/200ML-% IV SOLN
3.0000 mg/h | Freq: Once | INTRAVENOUS | Status: AC
  Administered 2017-02-27: 5 mg/h via INTRAVENOUS
  Administered 2017-02-27: 7.5 mg/h via INTRAVENOUS
  Filled 2017-02-27: qty 200

## 2017-02-27 MED ORDER — AMLODIPINE BESYLATE 10 MG PO TABS
10.0000 mg | ORAL_TABLET | Freq: Every day | ORAL | Status: DC
  Administered 2017-02-27 – 2017-02-28 (×2): 10 mg via ORAL
  Filled 2017-02-27: qty 2
  Filled 2017-02-27: qty 1

## 2017-02-27 MED ORDER — SODIUM CHLORIDE 0.9% FLUSH
3.0000 mL | Freq: Two times a day (BID) | INTRAVENOUS | Status: DC
  Administered 2017-02-27 – 2017-03-01 (×6): 3 mL via INTRAVENOUS

## 2017-02-27 NOTE — ED Notes (Signed)
Stop Cardene Per MD request.

## 2017-02-27 NOTE — ED Notes (Signed)
Attempted report 

## 2017-02-27 NOTE — ED Triage Notes (Signed)
[facility] reports the pt woke up with severe SOB and called 9-1-1. EMS reports the pt was in the 60's on RA with wheezing in all fields. EMS reports they gave 5mg  of Albuterol and 3 nitro tablets. EMS reports IV in left AC, 18 g. EMS reports pt was in the 80's on CPAP.

## 2017-02-27 NOTE — Progress Notes (Signed)
MD Eppie Gibson paged re: pt's Troponin 0.16

## 2017-02-27 NOTE — ED Notes (Signed)
Paged MD Su Hilt with MD he  requested patient be Cardene  triated down  and montior for 1 hour in 1 hour if BP stable turn off and montior.  MD also notified patient is off Bipap

## 2017-02-27 NOTE — ED Notes (Signed)
RT at bedside.

## 2017-02-27 NOTE — ED Notes (Signed)
Reports given to 5W

## 2017-02-27 NOTE — Progress Notes (Signed)
  Echocardiogram 2D Echocardiogram has been performed.  Devon Vega 02/27/2017, 10:59 AM

## 2017-02-27 NOTE — Progress Notes (Signed)
Patient placed on BiPAP following arrival with EMS on CPAP. Patient diaphoretic and coarse crackles bilaterally auscultated. Patient tolerating fairly well at this time. RT will continue to monitor.

## 2017-02-27 NOTE — H&P (Signed)
Date: 02/27/2017               Patient Name:  Devon Vega MRN: [id number]  DOB: [birth year] Age / Sex: 61 y.o., male   PCP: Kathi Ludwig, MD         Medical Service: Internal Medicine Teaching Service         Attending Physician: Dr. Oval Linsey, MD    First Contact: Dr. Aggie Hacker Pager: [phone]  Second Contact: Dr. Juleen China Pager: [phone]       After Hours (After 5p/  First Contact Pager: [phone]  weekends / holidays): Second Contact Pager: [phone]   Chief Complaint: Shortness of Breath  History of Present Illness:  [name] is a 61 year old male with PMH of Uncontrolled HTN, reported Cervical Disc Disease, HLD, and tobacco use who presented to the ED with dyspnea. Patient states he was in his usual state of health last night when he got up from rest to use the bathroom and smoke a cigarette. He says he then developed acute onset shortness of breath without chest pain, palpitations, nausea, vomiting, focal weakness, confusion, headache, change in vision, peripheral edema, lightheadedness, or dizziness. He says he has never had symptoms like this before and decided to call 911 for help. He did not fall or lose consciousness. He reports a chronic cough with occasional white/clear sputum production and chronic tingling in his left hand which is unchanged from prior. He says he was diagnosed with COPD about 5 years ago but is not on medications and does not have any shortness of breath at his baseline.  He is prescribed Amlodipine, HCTZ, Losartan, and Carvedilol which he says he did take yesterday, however he does admit that he misses doses often. He says he smokes 0.5 PPD since his youth and smokes occasional marijuana. He drinks about 2 beers per day and denies ever using cocaine or IV drugs. He denies any known prior history of heart disease, stroke, or blood clots. He otherwise denies any abdominal pain, dysuria, hematuria, hematochezia, or melena.  Per  nursing note, on EMS arrival patient was saturating in the 60s on RA and given Albuterol and NTG tablets x 3. He was brought to the ED on CPAP and transitioned to Bipap.  In the ED, initial vitals were: BP 183/117, Pulse 109, Temp not documented, RR 37, SpO2 100% on BiPAP.   Lab work was notable for a Creatinine 1.51 (baseline 0.9 - 1.0), AST 7., ALT 31, Glucose 267, BNP 1066 (no prior), CK 492, D-dimer 0.63, I-stat Troponin 0.07, UDS +THC, Ethanol <5, TSH 2.48, free T4 0.82.  He was started on a Nicardipine drip with improvement in his blood pressure and IMTS were called for admission with plans to wean off Nicardipine drip.  Meds:  Current Meds  Medication Sig  . amLODipine (NORVASC) 10 MG tablet Take 1 tablet (10 mg total) by mouth daily.  . carvedilol (COREG) 25 MG tablet Take 1 tablet (25 mg total) by mouth 2 (two) times daily with a meal.  . fluticasone (FLONASE) 50 MCG/ACT nasal spray Place 2 sprays into both nostrils daily. (Patient taking differently: Place 2 sprays into both nostrils as needed for allergies or rhinitis. )  . hydrochlorothiazide (HYDRODIURIL) 25 MG tablet Take 1 tablet (25 mg total) by mouth daily.  Marland Kitchen ibuprofen (ADVIL,MOTRIN) 200 MG tablet Take 400 mg by mouth every 6 (six) hours as needed for mild pain.  . rosuvastatin (CRESTOR) 10 MG tablet Take  1 tablet (10 mg total) by mouth daily.     Allergies: Allergies as of 02/27/2017 - Review Complete 02/27/2017  Allergen Reaction Noted  . Codeine    . Lisinopril  02/22/2010   Past Medical History:  Diagnosis Date  . Alcohol abuse    hx of , sober since 1997  . COPD (chronic obstructive pulmonary disease) ([place])   . Hyperlipidemia   . Hypertension   . 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 Sinus arrhythmia 05/24/2014  . Tobacco abuse    failed zyban  . Weight gain     Family History:  Family History  Problem Relation Age of Onset  . Cancer Father   . Renal Disease Brother        On Dialysis      Social History:  Social History   Social History  . Marital status: Divorced    Spouse name: N/A  . Number of children: N/A  . Years of education: N/A   Social History Main Topics  . Smoking status: Current Every Day Smoker    Packs/day: 0.75    Types: Cigarettes, Cigars  . Smokeless tobacco: Never Used     Comment: pt knows he neesd to quit  . Alcohol use 0.0 oz/week     Comment: 2 beers/day  . Drug use: Yes    Types: Marijuana  . Sexual activity: Not Asked   Other Topics Concern  . None   Social History Narrative   Works for [employer]. 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  Physical Exam: Blood pressure (!) 142/82, pulse 85, resp. rate 18, height 5\' 5"  (1.651 m), weight 165 lb (74.8 kg), SpO2 95 %. Physical Exam  Constitutional: He is oriented to person, place, and time. He appears well-developed and well-nourished. No distress.  Wearing Bipap, resting comfortably and speaking in complete sentences.  HENT:  Head: Normocephalic and atraumatic.  Eyes: Pupils are equal, round, and reactive to light. EOM are normal.  Neck: Neck supple.  Cardiovascular: Normal rate, regular rhythm and intact distal pulses.  Exam reveals no friction rub.   No murmur heard. Pulses equal bilaterally  Pulmonary/Chest: No respiratory distress.  Bilateral rales at the lower lobes with faint inspiratory wheezing on Bipap.  Abdominal: Soft. Bowel sounds are normal. He exhibits no distension. There is no tenderness. There is no guarding.  Musculoskeletal: He exhibits no edema or tenderness.  Neurological: He is alert and oriented to person, place, and time.  Strength 5/5 upper and lower extremities.   Skin: Skin is warm. He is not diaphoretic.  Psychiatric: He has a normal mood and affect.     EKG: personally reviewed my interpretation is Sinus Tachycardia, rate 120 bpm, LVH, TWI leads II, V5-V6, normal R wave progression.  CXR: personally reviewed my interpretation is interstitial pulmonary edema without effusion or consolidation. No prior for comparison.  Assessment & Plan by Problem: Principal Problem:   Hypertensive emergency Active Problems:   Hyperlipidemia   TOBACCO ABUSE   Uncontrolled stage 2 hypertension   Degenerative disc disease, cervical   LVH (left ventricular hypertrophy)  AKI (acute kidney injury) ([place])  61 year old male with PMH of Uncontrolled HTN, reported Cervical Disc Disease, HLD, and tobacco use who presented to the ED with hypertensive emergency and pulmonary edema.  Acute Hypoxic Respiratory Distress 2/2 Hypertensive Emergency and Pulmonary Edema: Patient's acute respiratory distress is most likely secondary to hypertensive emergency with pulmonary edema in the setting of chronically uncontrolled HTN. He does not report any other associated symptoms, however ACS should be ruled out in this case. There is no obvious sign of infectious cause of his symptoms. Although the D-dimer is mildly above the upper limit of normal, PE is less likely as his Modified Geneva score is 0 and he has a better explanation for his symptoms. He does not have any headache, change in vision, or focal neurological deficit. He is not encephalopathic. He is warm to touch and distal pulses are intact and equal bilaterally. His blood pressure has responded well to Nicardipine which is still being titrated off. His breathing is improved and he is now off Bipap saturating well on room air. Will admit to stepdown for observation. - Wean off Nicardipine drip - Give Lasix IV 40 mg once now - Off Bipap now - can use if needed - Resume home Amlodipine 10 mg daily - Will hold home Losartan,  HCTZ, for now given AKI - Will hold home Carvedilol for now to avoid decreasing cardiac contractility - Trend Troponins, repeat EKG in AM - TTE - Monitor I/Os  AKI: Likely secondary to hypertensive episode and home medications. Will repeat BMET in AM. Holding home meds as above and reintroduce as allowed. Check UA.   Dispo: Admit patient to Observation with expected length of stay less than 2 midnights.  Signed: Zada Finders, MD 02/27/2017, 8:47 AM

## 2017-02-27 NOTE — ED Provider Notes (Signed)
[place] DEPT Provider Note   CSN: [id number] Arrival date & time: 02/27/17  1062     History   Chief Complaint Chief Complaint  Patient presents with  . Shortness of Breath    HPI Devon Vega is a 61 y.o. male.  HPI Patient presents with acute respiratory failure. EMS was called. Patient woke with shortness of breath. Found to be diaphoretic with oxygen saturations in the 60s with increased work of breathing. Blood pressure was significantly elevated. Patient denied any chest pain. Admits to smoking marijuana before going to bed. No new lower sugary swelling or pain. No recent fever or chills. Past Medical History:  Diagnosis Date  . Alcohol abuse    hx of , sober since 1997  . COPD (chronic obstructive pulmonary disease) ([place])   . Hyperlipidemia   . Hypertension   . 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 Sinus arrhythmia 05/24/2014  . Tobacco abuse    failed zyban  . Weight gain     Patient Active Problem List   Diagnosis Date Noted  . Hypertensive emergency 02/27/2017  . AKI (acute kidney injury) ([place]) 02/27/2017  . Long term prescription opiate use 06/18/2016  . LVH (left ventricular hypertrophy) 09/05/2015  . Sinus arrhythmia 05/24/2014  . Preventive measure 07/30/2013  . 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 Degenerative disc disease, cervical 10/02/2010  . Hyperlipidemia 07/27/2006  . TOBACCO ABUSE 07/27/2006  . Uncontrolled stage 2 hypertension 07/27/2006    Past Surgical History:  Procedure Laterality Date  . COLONOSCOPY    . EXCISION METACARPAL MASS Right 02/03/2013   Procedure: WIDE RESECTION OF GRANULAR CELL TUMOR;  Surgeon: Cammie Sickle., MD;  Location: [place];  Service: Orthopedics;  Laterality: Right;  . LYMPH NODE BIOPSY  2011   lt axilla-negative  . MASS EXCISION Right 01/21/2013   Procedure: BIOPSY OF MASS RIGHT RING FINGER;  Surgeon: Cammie Sickle., MD;  Location: [place];  Service: Orthopedics;  Laterality: Right;  . SKIN FULL THICKNESS GRAFT Right 02/03/2013   Procedure: SKIN GRAFT FULL THICKNESS RIGHT RING FINGER;  Surgeon: Cammie Sickle., MD;  Location: [place];  Service: Orthopedics;  Laterality: Right;       Home Medications    Prior to Admission medications   Medication Sig Start Date End Date Taking? Authorizing Provider  amLODipine (NORVASC) 10 MG tablet Take 1 tablet (10 mg total) by mouth daily. 09/05/15  Yes Burns, Arloa Koh, MD  carvedilol (COREG) 25 MG tablet Take 1 tablet (25 mg total) by mouth 2 (two) times daily with a meal. 03/12/16  Yes Burns, Alexa R, MD  fluticasone (FLONASE) 50 MCG/ACT nasal spray Place 2 sprays into both nostrils daily. Patient taking differently: Place 2 sprays into both nostrils as needed for allergies or rhinitis.  09/05/15  Yes Burns, Arloa Koh, MD  hydrochlorothiazide (HYDRODIURIL) 25 MG tablet Take 1 tablet (25 mg total) by mouth daily. 03/12/16  Yes Burns, Arloa Koh, MD  ibuprofen (ADVIL,MOTRIN) 200 MG tablet Take 400 mg by mouth every 6 (six) hours as needed for mild pain.   Yes [provider]  rosuvastatin (CRESTOR) 10 MG tablet Take 1 tablet (10 mg total) by mouth daily. 06/25/16  Yes Burns, Arloa Koh, MD  HYDROcodone-acetaminophen (NORCO/VICODIN) 5-325 MG tablet Take 1 tablet by mouth daily as needed for moderate pain. Patient not taking: Reported on 02/27/2017 06/25/16   Quay Burow,  Alexa R, MD  HYDROcodone-acetaminophen (NORCO/VICODIN) 5-325 MG tablet Take 1 tablet by mouth daily as needed. Patient not taking: Reported on 02/27/2017 06/25/16   Florinda Marker, MD  HYDROcodone-acetaminophen (NORCO/VICODIN) 5-325 MG tablet Take 1 tablet by mouth every 6 (six) hours as needed for moderate pain. Patient not taking: Reported on 02/27/2017 08/27/16   Florinda Marker, MD  losartan (COZAAR) 50 MG tablet Take 1 tablet (50 mg total) by mouth daily. Patient not taking: Reported on 02/27/2017  06/25/16   Florinda Marker, MD    Family History Family History  Problem Relation Age of Onset  . Cancer Father   . Renal Disease Brother        On Dialysis    Social History Social History  Substance Use Topics  . Smoking status: Current Every Day Smoker    Packs/day: 0.75    Types: Cigarettes, Cigars  . Smokeless tobacco: Never Used     Comment: pt knows he neesd to quit  . Alcohol use 0.0 oz/week     Comment: 2 beers/day     Allergies   Codeine and Lisinopril   Review of Systems Review of Systems  Constitutional: Positive for diaphoresis. Negative for fever.  Eyes: Negative for visual disturbance.  Respiratory: Positive for shortness of breath and wheezing. Negative for cough.   Cardiovascular: Negative for chest pain, palpitations and leg swelling.  Gastrointestinal: Negative for abdominal pain, diarrhea, nausea and vomiting.  Musculoskeletal: Negative for back pain, myalgias and neck pain.  Skin: Negative for rash and wound.  Neurological: Negative for dizziness, weakness, light-headedness, numbness and headaches.  All other systems reviewed and are negative.    Physical Exam Updated Vital Signs BP (!) 156/96 (BP Location: Left Arm)   Pulse 70   Temp 98.4 F (36.9 C) (Oral)   Resp 18   Ht 5\' 5"  (1.651 m)   Wt 69.9 kg (154 lb 1.6 oz)   SpO2 100%   BMI 25.64 kg/m   Physical Exam  Constitutional: He is oriented to person, place, and time. He appears well-developed and well-nourished. He appears distressed.  HENT:  Head: Normocephalic and atraumatic.  Mouth/Throat: Oropharynx is clear and moist.  Eyes: Pupils are equal, round, and reactive to light. EOM are normal.  Neck: Normal range of motion. Neck supple. No JVD present.  Cardiovascular: Regular rhythm.   Tachycardia  Pulmonary/Chest:  Rhonchi throughout. Increased work of breathing  Abdominal: Soft. Bowel sounds are normal. There is no tenderness. There is no rebound and no guarding.    Musculoskeletal: Normal range of motion. He exhibits no edema or tenderness.  No lower extremity swelling, asymmetry or tenderness. Distal pulses are 3+ in all extremities.  Lymphadenopathy:    He has no cervical adenopathy.  Neurological: He is alert and oriented to person, place, and time.  Moves all extremities without focal deficit. Sensation intact.  Skin: Skin is warm. No rash noted. He is diaphoretic. No erythema.  Psychiatric: He has a normal mood and affect. His behavior is normal.  Nursing note and vitals reviewed.    ED Treatments / Results  Labs (all labs ordered are listed, but only abnormal results are displayed) Labs Reviewed  CBC WITH DIFFERENTIAL/PLATELET - Abnormal; Notable for the following:       Result Value   RDW 17.7 (*)    All other components within normal limits  COMPREHENSIVE METABOLIC PANEL - Abnormal; Notable for the following:    Glucose, Bld 267 (*)  Creatinine, Ser 1.51 (*)    Calcium 8.6 (*)    AST 73 (*)    GFR calc non Af Amer 47 (*)    GFR calc Af Amer 55 (*)    All other components within normal limits  BRAIN NATRIURETIC PEPTIDE - Abnormal; Notable for the following:    B Natriuretic Peptide 1,066.9 (*)    All other components within normal limits  CK - Abnormal; Notable for the following:    Total CK 492 (*)    All other components within normal limits  D-DIMER, QUANTITATIVE (NOT AT [facility]) - Abnormal; Notable for the following:    D-Dimer, Quant 0.63 (*)    All other components within normal limits  RAPID URINE DRUG SCREEN, HOSP PERFORMED - Abnormal; Notable for the following:    Tetrahydrocannabinol POSITIVE (*)    All other components within normal limits  URINALYSIS, ROUTINE W REFLEX MICROSCOPIC - Abnormal; Notable for the following:    Hgb urine dipstick SMALL (*)    Protein, ur 100 (*)    All other components within normal limits  TROPONIN I - Abnormal; Notable for the following:    Troponin I 0.10 (*)    All other components  within normal limits  TROPONIN I - Abnormal; Notable for the following:    Troponin I 0.16 (*)    All other components within normal limits  TROPONIN I - Abnormal; Notable for the following:    Troponin I 0.10 (*)    All other components within normal limits  HEMOGLOBIN A1C - Abnormal; Notable for the following:    Hgb A1c MFr Bld 6.0 (*)    All other components within normal limits  ETHANOL  TSH  T4, FREE  MAGNESIUM  HIV ANTIBODY (ROUTINE TESTING)  CBC  BASIC METABOLIC PANEL  I-STAT TROPONIN, ED    EKG  EKG Interpretation  Date/Time:  Friday February 27 2017 06:14:16 EDT Ventricular Rate:  75 PR Interval:    QRS Duration: 89 QT Interval:  434 QTC Calculation: 485 R Axis:   21 Text Interpretation:  Sinus rhythm Short PR interval LVH with secondary repolarization abnormality Borderline prolonged QT interval Baseline wander in lead(s) V2 Confirmed by Lita Mains  MD, Sirius Woodford ([id number]) on 02/27/2017 6:18:35 AM       Radiology Dg Chest Portable 1 View  Result Date: 02/27/2017 CLINICAL DATA:  Respiratory distress, shortness of breath. History of COPD. EXAM: PORTABLE CHEST 1 VIEW COMPARISON:  None. FINDINGS: Cardiac silhouette is mildly enlarged. Fall of the bilateral hila with pulmonary vascular congestion and interstitial prominence confluent in the lung bases. No pleural effusion. RIGHT apical pleural thickening. No pneumothorax. Soft tissue planes and included osseous structures are nonsuspicious. IMPRESSION: Cardiomegaly and interstitial pulmonary edema. Fullness of the hila compatible with vascular shadows though lymphadenopathy not excluded. Electronically Signed   By: Elon Alas M.D.   On: 02/27/2017 04:01    Procedures Procedures (including critical care time)  Medications Ordered in ED Medications  enoxaparin (LOVENOX) injection 40 mg (40 mg Subcutaneous Given 02/27/17 1749)  sodium chloride flush (NS) 0.9 % injection 3 mL (3 mLs Intravenous Given 02/27/17 2223)    acetaminophen (TYLENOL) tablet 650 mg (not administered)    Or  acetaminophen (TYLENOL) suppository 650 mg (not administered)  aspirin EC tablet 81 mg (81 mg Oral Given 02/27/17 1131)  amLODipine (NORVASC) tablet 10 mg (10 mg Oral Given 02/27/17 1242)  rosuvastatin (CRESTOR) tablet 10 mg (10 mg Oral Given 02/27/17 1749)  guaiFENesin ([place]) 12  hr tablet 600 mg (600 mg Oral Given 02/27/17 2223)  nicardipine (CARDENE) 20mg  in 0.86% saline 228ml IV infusion (0.1 mg/ml) (0 mg/hr Intravenous Stopped 02/27/17 0928)  LORazepam (ATIVAN) injection 0.5 mg (0.5 mg Intravenous Given 02/27/17 0422)  furosemide (LASIX) injection 40 mg (40 mg Intravenous Given 02/27/17 0819)   CRITICAL CARE Performed by: Lita Mains, Tinsley Lomas Total critical care time: 40 minutes Critical care time was exclusive of separately billable procedures and treating other patients. Critical care was necessary to treat or prevent imminent or life-threatening deterioration. Critical care was time spent personally by me on the following activities: development of treatment plan with patient and/or surrogate as well as nursing, discussions with consultants, evaluation of patient's response to treatment, examination of patient, obtaining history from patient or surrogate, ordering and performing treatments and interventions, ordering and review of laboratory studies, ordering and review of radiographic studies, pulse oximetry and re-evaluation of patient's condition.  Initial Impression / Assessment and Plan / ED Course  I have reviewed the triage vital signs and the nursing notes.  Pertinent labs & imaging results that were available during my care of the patient were reviewed by me and considered in my medical decision making (see chart for details).     Concern for hypertensive emergency with acute pulmonary edema. Will start on Cardene drip and continue BiPAP. Patient's blood pressure has improved substantially. He is in no distress.  Tolerating BiPAP well after 0.5 mg of Ativan. Will wean off Cardene drip. Discussed with internal medicine teaching service. Will see in emergency department and admit. Final Clinical Impressions(s) / ED Diagnoses   Final diagnoses:  Hypertensive emergency  Acute pulmonary edema [facility])    New Prescriptions Current Discharge Medication List       Julianne Rice, MD 02/27/17 2326

## 2017-02-28 ENCOUNTER — Encounter (HOSPITAL_COMMUNITY): Payer: Self-pay | Admitting: Adult Health

## 2017-02-28 DIAGNOSIS — I5021 Acute systolic (congestive) heart failure: Secondary | ICD-10-CM

## 2017-02-28 DIAGNOSIS — I161 Hypertensive emergency: Secondary | ICD-10-CM

## 2017-02-28 DIAGNOSIS — J810 Acute pulmonary edema: Secondary | ICD-10-CM

## 2017-02-28 LAB — BASIC METABOLIC PANEL
Anion gap: 11 (ref 5–15)
BUN: 9 mg/dL (ref 6–20)
CO2: 27 mmol/L (ref 22–32)
Calcium: 9.2 mg/dL (ref 8.9–10.3)
Chloride: 101 mmol/L (ref 101–111)
Creatinine, Ser: 1.18 mg/dL (ref 0.61–1.24)
GFR calc Af Amer: >60 mL/min (ref >60)
GFR calc non Af Amer: >60 mL/min (ref >60)
Glucose, Bld: 115 mg/dL — ABNORMAL HIGH (ref 65–99)
Potassium: 3.8 mmol/L (ref 3.5–5.1)
Sodium: 139 mmol/L (ref 135–145)

## 2017-02-28 LAB — CBC
HCT: 45.8 % (ref 39.0–52.0)
Hemoglobin: 14.8 g/dL (ref 13.0–17.0)
MCH: 28 pg (ref 26.0–34.0)
MCHC: 32.3 g/dL (ref 30.0–36.0)
MCV: 86.7 fL (ref 78.0–100.0)
Platelets: 273 10*3/uL (ref 150–400)
RBC: 5.28 MIL/uL (ref 4.22–5.81)
RDW: 17.3 % — ABNORMAL HIGH (ref 11.5–15.5)
WBC: 8.1 10*3/uL (ref 4.0–10.5)

## 2017-02-28 LAB — HIV ANTIBODY (ROUTINE TESTING W REFLEX): HIV Screen 4th Generation wRfx: NONREACTIVE

## 2017-02-28 MED ORDER — SACUBITRIL-VALSARTAN 49-51 MG PO TABS
1.0000 | ORAL_TABLET | Freq: Two times a day (BID) | ORAL | Status: DC
  Administered 2017-03-01 – 2017-03-02 (×3): 1 via ORAL
  Filled 2017-02-28 (×4): qty 1

## 2017-02-28 MED ORDER — SPIRONOLACTONE 25 MG PO TABS
12.5000 mg | ORAL_TABLET | Freq: Every day | ORAL | Status: DC
  Administered 2017-02-28 – 2017-03-02 (×3): 12.5 mg via ORAL
  Filled 2017-02-28 (×3): qty 1

## 2017-02-28 MED ORDER — GUAIFENESIN 100 MG/5ML PO SOLN
5.0000 mL | ORAL | Status: DC | PRN
  Administered 2017-02-28 – 2017-03-02 (×4): 100 mg via ORAL
  Filled 2017-02-28 (×5): qty 5

## 2017-02-28 MED ORDER — CARVEDILOL 25 MG PO TABS
25.0000 mg | ORAL_TABLET | Freq: Two times a day (BID) | ORAL | Status: DC
  Administered 2017-02-28 – 2017-03-02 (×4): 25 mg via ORAL
  Filled 2017-02-28 (×5): qty 1

## 2017-02-28 MED ORDER — HYDROCHLOROTHIAZIDE 25 MG PO TABS
25.0000 mg | ORAL_TABLET | Freq: Every day | ORAL | Status: DC
  Filled 2017-02-28: qty 1

## 2017-02-28 MED ORDER — LOSARTAN POTASSIUM 50 MG PO TABS
100.0000 mg | ORAL_TABLET | Freq: Every day | ORAL | Status: DC
  Filled 2017-02-28: qty 2

## 2017-02-28 NOTE — Consult Note (Signed)
Cardiology Consultation:   Patient ID: EVENS MENO; [id number]; 13-Aug-1955   Admit date: 02/27/2017 Date of Consult: 02/28/2017  Primary Care Provider: Kathi Ludwig, MD Primary Cardiologist: Devon Vega   Patient Profile:   Devon Vega is a 61 y.o. male with a hx of uncontrolled HTN, hyperlipidemia, COPD,ongoing tobacco abuse and cervical disc disease, intermittent medical compliance who is being seen today for the evaluation of uncontrolled hypertension.at the request of Devon Vega, Internal Medicine Teaching Service. Marland Kitchen  History of Present Illness:   Devon Vega presented to the emergency room with complaints of acute dyspnea. He states she was in his usual state of health and gotten up to use the bathroom, was sitting on the toilet he smoked a cigarette. After getting up from the toilet he had sudden onset of dyspnea. States she was unable to catch his breath at all. Call EMS as he had never experienced this before. He denied chest pain, diaphoresis, dizziness, nausea or vomiting, but did feel extremely weak. He admits to not taking his medications for 3 days, "I guess I'm just being lazy", but has had a history of taking his medications only intermittently. He is followed by teaching service downstairs for primary care.  On arrival to the emergency room the patient was found to be hypertensive with a blood pressure 179 of 115, heart rate 114 bpm, O2 sat 85%, he was afebrile.EKG revealed sinus tachycardia, heart rate 119 bpm, significant LVH with bi-atrial enlargement. Pertinent labs:troponin elevated at 0.10, UDS was negative for illicit drugs with exception of marijuana.TSH was normal.hemoglobin A1c elevated at 6.0.glucose 267, creatinine 1.51, potassium 5.1.DNP 1066.9.d-dimer 0.63. No evidence of ETOH.  Chest x-ray revealed fullness of the hila compatible with vascular status, cardiomegaly with interstitial pulmonary edema. He was treated with Cardene 20 mg IV,started on an  intravenous drip at 5 mg an hour titrated up to 10 mg an hour,given IV Lasix 40 mg 1, aspirin 81 mg, nothing by mouth amlodipine 10 mg.when patient was transferred to the floor blood pressure had decreased to 139/91, heart rate had been better controlled with a rate of 69 bpm. O2 sat was 98%.  Patient has had subsequent echocardiogram with report below. Echocardiogram did reveal reduced LV systolic function with EF of 35-57%, grade 1 diastolic dysfunction,moderate aortic valve regurgitation, no evidence of pulmonary hypertension.  Currently he is without complaint, breathing status has improved significantly, but continues to have some intermittent coughing. He was able to sleep last night lying flat.   Past Medical History:  Diagnosis Date  . Alcohol abuse    hx of , sober since 1997  . COPD (chronic obstructive pulmonary disease) ([place])   . Hyperlipidemia   . Hypertension   . 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 Sinus arrhythmia 05/24/2014  . Tobacco abuse    failed zyban  . Weight gain     Past Surgical History:  Procedure Laterality Date  . COLONOSCOPY    . EXCISION METACARPAL MASS Right 02/03/2013   Procedure: WIDE RESECTION OF GRANULAR CELL TUMOR;  Surgeon: Cammie Sickle., MD;  Location: [facility];  Service: Orthopedics;  Laterality: Right;  . LYMPH NODE BIOPSY  2011   lt axilla-negative  . MASS EXCISION Right 01/21/2013   Procedure: BIOPSY OF MASS RIGHT RING FINGER;  Surgeon: Cammie Sickle., MD;  Location: [facility];  Service: Orthopedics;  Laterality: Right;  . SKIN FULL THICKNESS GRAFT Right 02/03/2013   Procedure:  SKIN GRAFT FULL THICKNESS RIGHT RING FINGER;  Surgeon: Cammie Sickle., MD;  Location: [facility];  Service: Orthopedics;  Laterality: Right;     Home Medications:  Prior to Admission medications   Medication Sig Start Date End Date Taking? Authorizing Provider  amLODipine (NORVASC) 10 MG tablet  Take 1 tablet (10 mg total) by mouth daily. 09/05/15  Yes Burns, Arloa Koh, MD  carvedilol (COREG) 25 MG tablet Take 1 tablet (25 mg total) by mouth 2 (two) times daily with a meal. 03/12/16  Yes Burns, Alexa R, MD  fluticasone (FLONASE) 50 MCG/ACT nasal spray Place 2 sprays into both nostrils daily. Patient taking differently: Place 2 sprays into both nostrils as needed for allergies or rhinitis.  09/05/15  Yes Burns, Arloa Koh, MD  hydrochlorothiazide (HYDRODIURIL) 25 MG tablet Take 1 tablet (25 mg total) by mouth daily. 03/12/16  Yes Burns, Arloa Koh, MD  ibuprofen (ADVIL,MOTRIN) 200 MG tablet Take 400 mg by mouth every 6 (six) hours as needed for mild pain.   Yes [provider]  rosuvastatin (CRESTOR) 10 MG tablet Take 1 tablet (10 mg total) by mouth daily. 06/25/16  Yes Burns, Arloa Koh, MD  HYDROcodone-acetaminophen (NORCO/VICODIN) 5-325 MG tablet Take 1 tablet by mouth daily as needed for moderate pain. Patient not taking: Reported on 02/27/2017 06/25/16   Florinda Marker, MD  HYDROcodone-acetaminophen (NORCO/VICODIN) 5-325 MG tablet Take 1 tablet by mouth daily as needed. Patient not taking: Reported on 02/27/2017 06/25/16   Florinda Marker, MD  HYDROcodone-acetaminophen (NORCO/VICODIN) 5-325 MG tablet Take 1 tablet by mouth every 6 (six) hours as needed for moderate pain. Patient not taking: Reported on 02/27/2017 08/27/16   Florinda Marker, MD  losartan (COZAAR) 50 MG tablet Take 1 tablet (50 mg total) by mouth daily. Patient not taking: Reported on 02/27/2017 06/25/16   Florinda Marker, MD    Inpatient Medications: Scheduled Meds: . amLODipine  10 mg Oral Daily  . aspirin EC  81 mg Oral Daily  . carvedilol  25 mg Oral BID WC  . enoxaparin (LOVENOX) injection  40 mg Subcutaneous Q24H  . hydrochlorothiazide  25 mg Oral Daily  . losartan  100 mg Oral Daily  . rosuvastatin  10 mg Oral Daily  . sodium chloride flush  3 mL Intravenous Q12H   Continuous Infusions:  PRN Meds: acetaminophen  **OR** acetaminophen, guaiFENesin  Allergies:    Allergies  Allergen Reactions  . Codeine     REACTION: Unknown reaction  . Lisinopril     REACTION: cough    Social History:   Social History   Social History  . Marital status: Divorced    Spouse name: N/A  . Number of children: N/A  . Years of education: N/A   Occupational History  . Not on file.   Social History Main Topics  . Smoking status: Current Every Day Smoker    Packs/day: 0.75    Types: Cigarettes, Cigars  . Smokeless tobacco: Never Used     Comment: pt knows he neesd to quit  . Alcohol use 0.0 oz/week     Comment: 2 beers/day  . Drug use: Yes    Types: Marijuana  . Sexual activity: Not on file   Other Topics Concern  . Not on file   Social History Narrative   Works for [employer]. 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 Cancer Father   . Renal Disease Brother        On Dialysis  . Heart failure Brother   . Hypertension Brother   . Hypertension Mother      ROS:  Please see the history of present illness.  ROS  All other ROS reviewed and negative.     Physical Exam/Data:   Vitals:   02/27/17 1712 02/27/17 2106 02/28/17 0501 02/28/17 0921  BP: (!) 168/82 (!) 156/96 (!) 153/96 (!) 196/111  Pulse: 85 70 74   Resp: 20 18 16    Temp: 98.2 F (36.8 C) 98.4 F (36.9 C) 98.3 F (36.8 C)   TempSrc: Oral Oral Oral   SpO2: 100% 100% 100%   Weight: 154 lb 1.6 oz (69.9 kg)  154 lb 4.8 oz (70 kg)   Height: 5\' 5"  (1.651 m)       Intake/Output Summary (Last 24 hours) at 02/28/17 1154 Last data filed at 02/28/17 3664  Gross per 24 hour  Intake           816.67 ml  Output             1000 ml  Net           -183.33 ml   Filed Weights   02/27/17 0338 02/27/17 1712 02/28/17 0501  Weight: 165 lb (74.8 kg) 154 lb 1.6 oz (69.9 kg) 154 lb 4.8 oz (70 kg)   Body mass index is 25.68 kg/m.  General:  Well nourished, well developed, in no acute distress HEENT: normal Lymph: no adenopathy Neck: no JVD Endocrine:  No thryomegaly Vascular: No carotid bruits; FA pulses 2+ bilaterally without bruits  Cardiac:  normal S1, S2; RRR; no murmur  Lungs:  Clear to auscultation bilaterally, no wheezing, rhonchi or rales  Abd: soft, nontender, no hepatomegaly  Ext: no edema Musculoskeletal:  No deformities, BUE and BLE strength normal and equal Skin: warm and dry  Neuro:  CNs 2-12 intact, no focal abnormalities noted Psych:  Normal affect   EKG:  The EKG was personally reviewed and demonstrates:  Sinus tachycardia with significant LVH and biatrial enlargement. Telemetry:  Telemetry was personally reviewed and demonstrates:  Sinus rhythm with LVH   Relevant CV Studies: Echocardiogram 02/27/2017 Left ventricle: The cavity size was mildly dilated. There was   mild-moderate concentric hypertrophy. Systolic function was   moderately to severely reduced. The estimated ejection fraction   was in the range of 30% to 35%. Diffuse hypokinesis. Doppler   parameters are consistent with abnormal left ventricular   relaxation (grade 1 diastolic dysfunction). Doppler parameters   are consistent with indeterminate ventricular filling pressure. - Aortic valve: Transvalvular velocity was within the normal range.   There was no stenosis. There was moderate regurgitation.   Regurgitation pressure half-time: 440 ms. - Mitral valve: Transvalvular velocity was within the normal range.   There was no evidence for stenosis. There was no regurgitation. - Right ventricle: The cavity size was normal. Wall thickness was   normal. Systolic function was normal. - Atrial septum: No defect or patent foramen ovale was identified   by  color flow Doppler. - Tricuspid valve: There was mild regurgitation. - Pulmonary arteries: Systolic pressure  was within the normal   range. PA peak pressure: 28 mm Hg (S).  Laboratory Data:  Chemistry  Recent Labs Lab 02/27/17 0335 02/28/17 0502  NA 141 139  K 5.1 3.8  CL 109 101  CO2 23 27  GLUCOSE 267* 115*  BUN 8 9  CREATININE 1.51* 1.18  CALCIUM 8.6* 9.2  GFRNONAA 47* >60  GFRAA 55* >60  ANIONGAP 9 11     Recent Labs Lab 02/27/17 0335  PROT 6.5  ALBUMIN 3.7  AST 73*  ALT 31  ALKPHOS 94  BILITOT 0.8   Hematology  Recent Labs Lab 02/27/17 0335 02/28/17 0502  WBC 9.1 8.1  RBC 4.93 5.28  HGB 13.9 14.8  HCT 43.2 45.8  MCV 87.6 86.7  MCH 28.2 28.0  MCHC 32.2 32.3  RDW 17.7* 17.3*  PLT 289 273   Cardiac Enzymes  Recent Labs Lab 02/27/17 0936 02/27/17 1530 02/27/17 1930  TROPONINI 0.10* 0.16* 0.10*     Recent Labs Lab 02/27/17 0354  TROPIPOC 0.07    BNP  Recent Labs Lab 02/27/17 0335  BNP 1,066.9*    DDimer   Recent Labs Lab 02/27/17 0335  DDIMER 0.63*    Radiology/Studies:  Dg Chest Portable 1 View  Result Date: 02/27/2017 CLINICAL DATA:  Respiratory distress, shortness of breath. History of COPD. EXAM: PORTABLE CHEST 1 VIEW COMPARISON:  None. FINDINGS: Cardiac silhouette is mildly enlarged. Fall of the bilateral hila with pulmonary vascular congestion and interstitial prominence confluent in the lung bases. No pleural effusion. RIGHT apical pleural thickening. No pneumothorax. Soft tissue planes and included osseous structures are nonsuspicious. IMPRESSION: Cardiomegaly and interstitial pulmonary edema. Fullness of the hila compatible with vascular shadows though lymphadenopathy not excluded. Electronically Signed   By: Elon Alas M.D.   On: 02/27/2017 04:01    Assessment and Plan:   1. Hypertensive urgency: Blood pressure is much better controlled. He is currently on amlodipine 10 mg daily, carvedilol 25 mg twice a  day,losartan 100 mg daily (up from 50 mg daily at home). He is currently without complaints. With reduced LV systolic function will discontinue amlodipine, as he would  likely do better on Entresto (he is listed as allergic to lisinopril with cough, but has chronic coughing without ACE), and spironolactone, creatinine 1.18 this a.m. Was on hydralazine at home which is continued at 25 mg daily.    2. Systolic dysfunction: Echocardiogram revealed EF of 30-35%. No prior echocardiogram for comparison. Will likely need cardiac cath for evaluation of ischemia as etiology for reduced EF and cardiomyopathy.  3. Pulmonary edema: Patient has been diuresing since admission with -3400 cc output, with a  negativity I/O of -2.583 Currently not on diuretics, with some crackles in the bases. Would need to be on at least po diuretics daily.   4. Hx of COPD with ongoing tobacco abuse; May benefit from inhalers. He has chronic cough, which is listed as allergy to lisinopril. He denies allergy. Requests cough syrup, as pills make him cough more as he tries to swallow them. Immediate smoking cessation is recommended.   5. Hyperlipidemia: On statin therapy.   6. Elevated blood glucose; Hgb A1C 6.0. No history of diabetes.   Signed, Jory Sims, NP  02/28/2017 11:54 AM2  Patient seen and examined with the above-signed Advanced Practice Provider and/or Housestaff. I personally reviewed laboratory data, imaging studies and relevant notes. I independently examined the patient and formulated the important aspects of the plan. I have edited the note to reflect  any of my changes or salient points. I have personally discussed the plan with the patient and/or family.  61 y/o male with severe HTN admitted with HTN crisis and acute systolic HF  Echo reviewed personally EF ~35-40%. Suspect cardiomyopathy is hypertensive in nature but also has RFs for CAD incuding tobacco, HTN, HL. Volume status improved. Will adjust HTN meds  to include Entresto and spiro. Be careful not to drop BP too much further so will stop amlodipine and HCTZ. Plan R/L cath Monday. I discussed procedure with him. May also need outpatient sleep study.  Glori Bickers, MD  12:56 PM

## 2017-02-28 NOTE — Progress Notes (Signed)
   Subjective: Patient seen and examine. He states he feels back to his baseline. He had no difficulty breathing overnight. Night team was paged for cough and gave the patient mucinex. Not complaining of cough this AM.   Objective:  Vital signs in last 24 hours: Vitals:   02/27/17 1712 02/27/17 2106 02/28/17 0501 02/28/17 0921  BP: (!) 168/82 (!) 156/96 (!) 153/96 (!) 196/111  Pulse: 85 70 74   Resp: 20 18 16    Temp: 98.2 F (36.8 C) 98.4 F (36.9 C) 98.3 F (36.8 C)   TempSrc: Oral Oral Oral   SpO2: 100% 100% 100%   Weight: 154 lb 1.6 oz (69.9 kg)  154 lb 4.8 oz (70 kg)   Height: 5\' 5"  (1.651 m)      General: Laying in bed comfortably, NAD HEENT: [place]/AT, EOMI, no scleral icterus Cardiac: RRR, No R/M/G Pulm: normal effort, faint bibasilar crackles R>L, no wheezing  Abd: soft, non tender, non distended, BS normal Ext: extremities well perfused, no peripheral edema Neuro: alert and oriented X3, cranial nerves II-XII grossly intact  Assessment/Plan:  Principal Problem:   Hypertensive emergency Active Problems:   Hyperlipidemia   TOBACCO ABUSE   Uncontrolled stage 2 hypertension   Degenerative disc disease, cervical   LVH (left ventricular hypertrophy)   AKI (acute kidney injury) ([facility])  Acute Hypoxic Respiratory Distress 2/2 Hypertensive Emergency and Pulmonary Edema: Patient's acute respiratory distress is most likely secondary to hypertensive emergency with pulmonary edema in the setting of chronically uncontrolled HTN. Restarted home BP meds, saturating 100% on RA. ECHO showed mildly dilated LV, systolic function moderately to severely reduced, estimated EF 30-35%, with diffuse hypokinesis. Troponin plateaued 0.1->0.16->0.1. Net negative about 2.5 liters since admission.  - Consulted cardiology for ischemic work up, appreciate recs -Current medication regimen: Amlodipine 10 mg daily, Losartan 100 mg daily, HCTZ 25 mg daily, Carvedilol 25 mg BID. May be changing per cardiology  recommendations  -possible d/c today pending cardiology inpatient vs outpatient work up  AKI Most likely secondary to hypertensive episode. Creatinine improved from 1.51->1.19. Patients baseline is around 1.0 so trending down appropriately.  -Restarted home meds -Increased Losartan to 100 mg daily from 50 mg.   Dispo: Anticipated discharge today or tomorrow.  Melanee Spry, MD 02/28/2017, 12:37 PM Pager: [phone]

## 2017-02-28 NOTE — Progress Notes (Signed)
Internal Medicine Attending  Date: 02/28/2017  Patient name: Devon Vega Medical record number: [id number] Date of birth: May 18, 1956 Age: 61 y.o. Gender: male  I saw and evaluated the patient. I reviewed the resident's note by Dr. Aggie Hacker and I agree with the resident's findings and plans as documented in her progress note.  Please see my H&P dated 02/28/2017 and attached to Dr. Serita Grit H&P dated 02/27/2017 for the specifics of my evaluation, assessment, and plan from earlier in the day.

## 2017-03-01 DIAGNOSIS — I517 Cardiomegaly: Secondary | ICD-10-CM

## 2017-03-01 DIAGNOSIS — I5020 Unspecified systolic (congestive) heart failure: Secondary | ICD-10-CM | POA: Diagnosis not present

## 2017-03-01 DIAGNOSIS — I428 Other cardiomyopathies: Secondary | ICD-10-CM | POA: Diagnosis not present

## 2017-03-01 LAB — BASIC METABOLIC PANEL
Anion gap: 4 — ABNORMAL LOW (ref 5–15)
BUN: 12 mg/dL (ref 6–20)
CO2: 34 mmol/L — ABNORMAL HIGH (ref 22–32)
Calcium: 9.2 mg/dL (ref 8.9–10.3)
Chloride: 98 mmol/L — ABNORMAL LOW (ref 101–111)
Creatinine, Ser: 1.17 mg/dL (ref 0.61–1.24)
GFR calc Af Amer: >60 mL/min (ref >60)
GFR calc non Af Amer: >60 mL/min (ref >60)
Glucose, Bld: 101 mg/dL — ABNORMAL HIGH (ref 65–99)
Potassium: 3.5 mmol/L (ref 3.5–5.1)
Sodium: 136 mmol/L (ref 135–145)

## 2017-03-01 LAB — LIPID PANEL
Cholesterol: 183 mg/dL (ref 0–200)
HDL: 45 mg/dL (ref >40)
LDL Cholesterol: 99 mg/dL (ref 0–99)
Total CHOL/HDL Ratio: 4.1 RATIO
Triglycerides: 194 mg/dL — ABNORMAL HIGH (ref <150)
VLDL: 39 mg/dL (ref 0–40)

## 2017-03-01 MED ORDER — ASPIRIN 81 MG PO CHEW
81.0000 mg | CHEWABLE_TABLET | ORAL | Status: AC
  Administered 2017-03-02: 81 mg via ORAL
  Filled 2017-03-01: qty 1

## 2017-03-01 MED ORDER — NICOTINE 14 MG/24HR TD PT24
14.0000 mg | MEDICATED_PATCH | Freq: Every day | TRANSDERMAL | Status: DC
  Administered 2017-03-01 – 2017-03-02 (×2): 14 mg via TRANSDERMAL
  Filled 2017-03-01 (×2): qty 1

## 2017-03-01 MED ORDER — SODIUM CHLORIDE 0.9 % IV SOLN
INTRAVENOUS | Status: DC
  Administered 2017-03-02: 500 mL via INTRAVENOUS
  Administered 2017-03-02: 03:00:00 via INTRAVENOUS

## 2017-03-01 NOTE — Progress Notes (Signed)
   Subjective:  Patient seen and examine. Feels well.  Reports he was surprised at how well his BP was this morning.  No other complaints.  Agrees to cath tomorrow.  Objective:  Vital signs in last 24 hours: Vitals:   02/28/17 1451 02/28/17 1719 02/28/17 2115 03/01/17 0618  BP: (!) 169/92 (!) 197/115 (!) 151/89 133/73  Pulse: 81 91 86 65  Resp: 17  18 18   Temp: 99.4 F (37.4 C)  99 F (37.2 C) 98.3 F (36.8 C)  TempSrc: Oral  Oral Oral  SpO2: 99%  100% 99%  Weight:    155 lb 14.4 oz (70.7 kg)  Height:       General: Laying in bed comfortably, NAD HEENT: [place]/AT, EOMI, no scleral icterus Cardiac: RRR Pulm: normal effort, no wheezes or crackles Abd: soft, non tender, non distended, BS normal Ext: extremities well perfused, no peripheral edema Neuro: alert and oriented X3, cranial nerves II-XII grossly intact  Assessment/Plan:  Principal Problem:   Hypertensive emergency Active Problems:   Hyperlipidemia   TOBACCO ABUSE   Uncontrolled stage 2 hypertension   Degenerative disc disease, cervical   LVH (left ventricular hypertrophy)   AKI (acute kidney injury) ([facility])  Acute Hypoxic Respiratory Distress 2/2 Hypertensive Emergency and Pulmonary Edema Acute Exacerbation of Systolic and Diastolic HF Patient's acute respiratory distress was most likely secondary to hypertensive emergency with pulmonary edema in the setting of chronically uncontrolled HTN and newly discovered LV dysfunction on Echo.   - Consulted cardiology for ischemic work up, Personal assistant.  Plan for RHC/LHC on Monday - Continue current medications: Entresto, Coreg 25mg  BID, Spironolactone 12.5mg  BID - NPO at MN for cath  AKI Most likely secondary to hypertensive episode. Creatinine improved to 1.17. Patients baseline is around 1.0 so trending down appropriately.  - Follow BMET  Dispo: Anticipated discharge in 1-2 days  Jule Ser, DO 03/01/2017, 9:13 AM

## 2017-03-01 NOTE — Progress Notes (Signed)
Advanced Heart Failure Rounding Note   Subjective:     Feels good this am. Breathing better. No CP. BP control much improved.   Objective:   Weight Range:  Vital Signs:   Temp:  [98.3 F (36.8 C)-99.4 F (37.4 C)] 98.3 F (36.8 C) (08/19 0618) Pulse Rate:  [65-91] 80 (08/19 0959) Resp:  [17-18] 18 (08/19 0618) BP: (133-197)/(73-115) 158/89 (08/19 1058) SpO2:  [99 %-100 %] 99 % (08/19 0618) Weight:  [70.7 kg (155 lb 14.4 oz)] 70.7 kg (155 lb 14.4 oz) (08/19 0618) Last BM Date: 02/28/17  Weight change: Filed Weights   02/27/17 1712 02/28/17 0501 03/01/17 0618  Weight: 69.9 kg (154 lb 1.6 oz) 70 kg (154 lb 4.8 oz) 70.7 kg (155 lb 14.4 oz)    Intake/Output:   Intake/Output Summary (Last 24 hours) at 03/01/17 1403 Last data filed at 03/01/17 1200  Gross per 24 hour  Intake              520 ml  Output              300 ml  Net              220 ml     Physical Exam: General:  Well appearing. No resp difficulty HEENT: normal Neck: supple. JVP 6-7 . Carotids 2+ bilat; no bruits. No lymphadenopathy or thryomegaly appreciated. Cor: PMI nondisplaced. Regular rate & rhythm.+s4 no s3 Lungs: clear. Mildly decreased BS throughout  Abdomen: soft, nontender, nondistended. No hepatosplenomegaly. No bruits or masses. Good bowel sounds. Extremities: no cyanosis, clubbing, rash, edema Neuro: alert & orientedx3, cranial nerves grossly intact. moves all 4 extremities w/o difficulty. Affect pleasant  Telemetry: NSR Personally reviewed   Labs: Basic Metabolic Panel:  Recent Labs Lab 02/27/17 0335 02/27/17 0936 02/28/17 0502 03/01/17 0514  NA 141  --  139 136  K 5.1  --  3.8 3.5  CL 109  --  101 98*  CO2 23  --  27 34*  GLUCOSE 267*  --  115* 101*  BUN 8  --  9 12  CREATININE 1.51*  --  1.18 1.17  CALCIUM 8.6*  --  9.2 9.2  MG  --  2.2  --   --     Liver Function Tests:  Recent Labs Lab 02/27/17 0335  AST 73*  ALT 31  ALKPHOS 94  BILITOT 0.8  PROT 6.5    ALBUMIN 3.7   No results for input(s): LIPASE, AMYLASE in the last 168 hours. No results for input(s): AMMONIA in the last 168 hours.  CBC:  Recent Labs Lab 02/27/17 0335 02/28/17 0502  WBC 9.1 8.1  NEUTROABS 5.4  --   HGB 13.9 14.8  HCT 43.2 45.8  MCV 87.6 86.7  PLT 289 273    Cardiac Enzymes:  Recent Labs Lab 02/27/17 0335 02/27/17 0936 02/27/17 1530 02/27/17 1930  CKTOTAL 492*  --   --   --   TROPONINI  --  0.10* 0.16* 0.10*    BNP: BNP (last 3 results)  Recent Labs  02/27/17 0335  BNP 1,066.9*    ProBNP (last 3 results) No results for input(s): PROBNP in the last 8760 hours.    Other results:  Imaging:  No results found.   Medications:     Scheduled Medications: . aspirin EC  81 mg Oral Daily  . carvedilol  25 mg Oral BID WC  . enoxaparin (LOVENOX) injection  40 mg Subcutaneous Q24H  .  nicotine  14 mg Transdermal Daily  . rosuvastatin  10 mg Oral Daily  . sacubitril-valsartan  1 tablet Oral BID  . sodium chloride flush  3 mL Intravenous Q12H  . spironolactone  12.5 mg Oral Daily     Infusions:   PRN Medications:  acetaminophen **OR** acetaminophen, guaiFENesin   Assessment:    61 y/o male with severe HTN admitted with HTN crisis and acute systolic HF EF 45-80%   Plan/Discussion:     1. Acute systolic HF - EF 99-83%. Volume status improved - On Entresto, spiro and carvedilol. - Possible HTN cardiomyopathy but needs ischemic w/u - R/L cath in am   2. Hypertensive crisis - Much improved on new regimen - Stressed ned for med compliance  3. Tobacco use - discussed need for cessation   4. Acute respiratory failure - resolved with diuresis   Length of Stay: 0   Shelley Pooley MD 03/01/2017, 2:03 PM  Advanced Heart Failure Team Pager [phone] (M-F; 7a - 4p)  Please contact [facility] for night-coverage after hours (4p -7a ) and weekends on [email or web address]

## 2017-03-02 ENCOUNTER — Encounter (HOSPITAL_COMMUNITY)
Admission: EM | Disposition: A | Discharge status: Home / Self Care | Payer: Self-pay | Source: Home / Self Care | Attending: Internal Medicine

## 2017-03-02 ENCOUNTER — Encounter (HOSPITAL_COMMUNITY): Payer: Self-pay | Admitting: Internal Medicine

## 2017-03-02 DIAGNOSIS — I509 Heart failure, unspecified: Secondary | ICD-10-CM

## 2017-03-02 DIAGNOSIS — I428 Other cardiomyopathies: Secondary | ICD-10-CM

## 2017-03-02 HISTORY — PX: RIGHT/LEFT HEART CATH AND CORONARY ANGIOGRAPHY: CATH118266

## 2017-03-02 HISTORY — DX: Heart failure, unspecified: I50.9

## 2017-03-02 LAB — POCT I-STAT 3, ART BLOOD GAS (G3+)
Acid-base deficit: 1 mmol/L (ref 0.0–2.0)
Bicarbonate: 24.1 mmol/L (ref 20.0–28.0)
O2 Saturation: 93 %
TCO2: 25 mmol/L (ref 0–100)
pCO2 arterial: 39.1 mmHg (ref 32.0–48.0)
pH, Arterial: 7.397 (ref 7.350–7.450)
pO2, Arterial: 67 mmHg — ABNORMAL LOW (ref 83.0–108.0)

## 2017-03-02 LAB — BASIC METABOLIC PANEL
Anion gap: 12 (ref 5–15)
BUN: 18 mg/dL (ref 6–20)
CO2: 23 mmol/L (ref 22–32)
Calcium: 9 mg/dL (ref 8.9–10.3)
Chloride: 101 mmol/L (ref 101–111)
Creatinine, Ser: 1.19 mg/dL (ref 0.61–1.24)
GFR calc Af Amer: >60 mL/min (ref >60)
GFR calc non Af Amer: >60 mL/min (ref >60)
Glucose, Bld: 115 mg/dL — ABNORMAL HIGH (ref 65–99)
Potassium: 3.4 mmol/L — ABNORMAL LOW (ref 3.5–5.1)
Sodium: 136 mmol/L (ref 135–145)

## 2017-03-02 LAB — POCT I-STAT 3, VENOUS BLOOD GAS (G3P V)
Acid-base deficit: 1 mmol/L (ref 0.0–2.0)
Bicarbonate: 24.2 mmol/L (ref 20.0–28.0)
Bicarbonate: 25.6 mmol/L (ref 20.0–28.0)
Bicarbonate: 25.8 mmol/L (ref 20.0–28.0)
O2 Saturation: 54 %
O2 Saturation: 58 %
O2 Saturation: 58 %
TCO2: 25 mmol/L (ref 0–100)
TCO2: 27 mmol/L (ref 0–100)
TCO2: 27 mmol/L (ref 0–100)
pCO2, Ven: 42 mmHg — ABNORMAL LOW (ref 44.0–60.0)
pCO2, Ven: 44.4 mmHg (ref 44.0–60.0)
pCO2, Ven: 45 mmHg (ref 44.0–60.0)
pH, Ven: 7.363 (ref 7.250–7.430)
pH, Ven: 7.368 (ref 7.250–7.430)
pH, Ven: 7.373 (ref 7.250–7.430)
pO2, Ven: 30 mmHg — CL (ref 32.0–45.0)
pO2, Ven: 31 mmHg — CL (ref 32.0–45.0)
pO2, Ven: 31 mmHg — CL (ref 32.0–45.0)

## 2017-03-02 LAB — MAGNESIUM: Magnesium: 2.4 mg/dL (ref 1.7–2.4)

## 2017-03-02 LAB — PROTIME-INR
INR: 0.87
Prothrombin Time: 11.8 seconds (ref 11.4–15.2)

## 2017-03-02 SURGERY — RIGHT/LEFT HEART CATH AND CORONARY ANGIOGRAPHY
Anesthesia: LOCAL

## 2017-03-02 MED ORDER — SODIUM CHLORIDE 0.9% FLUSH: 3.0000 mL | Freq: Two times a day (BID) | INTRAVENOUS | Status: DC

## 2017-03-02 MED ORDER — HEPARIN SODIUM (PORCINE) 1000 UNIT/ML IJ SOLN
INTRAMUSCULAR | Status: DC | PRN
  Administered 2017-03-02: 4000 [IU] via INTRAVENOUS

## 2017-03-02 MED ORDER — SODIUM CHLORIDE 0.9 % IV SOLN: INTRAVENOUS | Status: AC

## 2017-03-02 MED ORDER — SPIRONOLACTONE 25 MG PO TABS: 12.5000 mg | ORAL_TABLET | Freq: Every day | ORAL | 1 refills | Status: DC

## 2017-03-02 MED ORDER — CARVEDILOL 12.5 MG PO TABS: 12.5000 mg | ORAL_TABLET | Freq: Two times a day (BID) | ORAL | 1 refills | Status: DC

## 2017-03-02 MED ORDER — FENTANYL CITRATE (PF) 100 MCG/2ML IJ SOLN
INTRAMUSCULAR | Status: AC
  Filled 2017-03-02: qty 2

## 2017-03-02 MED ORDER — SACUBITRIL-VALSARTAN 24-26 MG PO TABS: 1.0000 | ORAL_TABLET | Freq: Two times a day (BID) | ORAL | 0 refills | Status: DC

## 2017-03-02 MED ORDER — IOPAMIDOL (ISOVUE-370) INJECTION 76%
INTRAVENOUS | Status: DC | PRN
  Administered 2017-03-02: 50 mL via INTRA_ARTERIAL

## 2017-03-02 MED ORDER — SODIUM CHLORIDE 0.9 % IV SOLN: 250.0000 mL | INTRAVENOUS | Status: DC | PRN

## 2017-03-02 MED ORDER — HEPARIN (PORCINE) IN NACL 2-0.9 UNIT/ML-% IJ SOLN
INTRAMUSCULAR | Status: AC
  Filled 2017-03-02: qty 1000

## 2017-03-02 MED ORDER — LIDOCAINE HCL (PF) 1 % IJ SOLN
INTRAMUSCULAR | Status: DC | PRN
  Administered 2017-03-02 (×2): 4 mL

## 2017-03-02 MED ORDER — ACETAMINOPHEN 325 MG PO TABS: 650.0000 mg | ORAL_TABLET | ORAL | Status: DC | PRN

## 2017-03-02 MED ORDER — POTASSIUM CHLORIDE CRYS ER 20 MEQ PO TBCR: 40.0000 meq | EXTENDED_RELEASE_TABLET | Freq: Once | ORAL | Status: DC

## 2017-03-02 MED ORDER — CARVEDILOL 12.5 MG PO TABS: 12.5000 mg | ORAL_TABLET | Freq: Two times a day (BID) | ORAL | Status: DC

## 2017-03-02 MED ORDER — MIDAZOLAM HCL 2 MG/2ML IJ SOLN
INTRAMUSCULAR | Status: AC
  Filled 2017-03-02: qty 2

## 2017-03-02 MED ORDER — MIDAZOLAM HCL 2 MG/2ML IJ SOLN
INTRAMUSCULAR | Status: DC | PRN
  Administered 2017-03-02: 1 mg via INTRAVENOUS

## 2017-03-02 MED ORDER — IOPAMIDOL (ISOVUE-370) INJECTION 76%
INTRAVENOUS | Status: AC
  Filled 2017-03-02: qty 100

## 2017-03-02 MED ORDER — LIDOCAINE HCL (PF) 1 % IJ SOLN
INTRAMUSCULAR | Status: AC
  Filled 2017-03-02: qty 30

## 2017-03-02 MED ORDER — HEPARIN SODIUM (PORCINE) 1000 UNIT/ML IJ SOLN
INTRAMUSCULAR | Status: AC
  Filled 2017-03-02: qty 1

## 2017-03-02 MED ORDER — SODIUM CHLORIDE 0.9% FLUSH: 3.0000 mL | INTRAVENOUS | Status: DC | PRN

## 2017-03-02 MED ORDER — POTASSIUM CHLORIDE CRYS ER 20 MEQ PO TBCR
20.0000 meq | EXTENDED_RELEASE_TABLET | Freq: Once | ORAL | Status: AC
  Administered 2017-03-02: 20 meq via ORAL
  Filled 2017-03-02: qty 1

## 2017-03-02 MED ORDER — VERAPAMIL HCL 2.5 MG/ML IV SOLN
INTRAVENOUS | Status: AC
  Filled 2017-03-02: qty 2

## 2017-03-02 MED ORDER — FENTANYL CITRATE (PF) 100 MCG/2ML IJ SOLN
INTRAMUSCULAR | Status: DC | PRN
  Administered 2017-03-02: 25 ug via INTRAVENOUS

## 2017-03-02 MED ORDER — ONDANSETRON HCL 4 MG/2ML IJ SOLN: 4.0000 mg | Freq: Four times a day (QID) | INTRAMUSCULAR | Status: DC | PRN

## 2017-03-02 MED ORDER — VERAPAMIL HCL 2.5 MG/ML IV SOLN
INTRAVENOUS | Status: DC | PRN
  Administered 2017-03-02: 10:00:00 via INTRA_ARTERIAL

## 2017-03-02 MED ORDER — HEPARIN (PORCINE) IN NACL 2-0.9 UNIT/ML-% IJ SOLN
INTRAMUSCULAR | Status: AC | PRN
  Administered 2017-03-02: 1000 mL

## 2017-03-02 SURGICAL SUPPLY — 10 items
CATH 5FR JL3.5 JR4 ANG PIG MP (CATHETERS) ×1 IMPLANT
CATH BALLN WEDGE 5F 110CM (CATHETERS) ×1 IMPLANT
DEVICE RAD COMP TR BAND LRG (VASCULAR PRODUCTS) ×1 IMPLANT
GLIDESHEATH SLEND SS 6F .021 (SHEATH) ×1 IMPLANT
GUIDEWIRE INQWIRE 1.5J.035X260 (WIRE) IMPLANT
INQWIRE 1.5J .035X260CM (WIRE) ×2
KIT HEART LEFT (KITS) ×2 IMPLANT
PACK CARDIAC CATHETERIZATION (CUSTOM PROCEDURE TRAY) ×2 IMPLANT
SHEATH GLIDE SLENDER 4/5FR (SHEATH) ×1 IMPLANT
TRANSDUCER W/STOPCOCK (MISCELLANEOUS) ×2 IMPLANT

## 2017-03-02 NOTE — Progress Notes (Signed)
Advanced Heart Failure Rounding Note   Subjective:     Doing well. No CP or SOB. SBPs remain in 130s.   Objective:   Weight Range:  Vital Signs:   Temp:  [98.1 F (36.7 C)-98.5 F (36.9 C)] 98.2 F (36.8 C) (08/20 0615) Pulse Rate:  [36-80] 65 (08/20 0916) Resp:  [18-20] 20 (08/20 0615) BP: (130-158)/(74-89) 138/82 (08/20 0916) SpO2:  [98 %-100 %] 98 % (08/20 0615) Weight:  [70.4 kg (155 lb 3.2 oz)] 70.4 kg (155 lb 3.2 oz) (08/20 0615) Last BM Date: 03/02/17  Weight change: Filed Weights   02/28/17 0501 03/01/17 0618 03/02/17 0615  Weight: 70 kg (154 lb 4.8 oz) 70.7 kg (155 lb 14.4 oz) 70.4 kg (155 lb 3.2 oz)    Intake/Output:   Intake/Output Summary (Last 24 hours) at 03/02/17 1012 Last data filed at 03/02/17 4540  Gross per 24 hour  Intake           715.17 ml  Output              800 ml  Net           -84.83 ml     Physical Exam: General:  Well appearing. No resp difficulty HEENT: normal Neck: supple. no JVD. Carotids 2+ bilat; no bruits. No lymphadenopathy or thryomegaly appreciated. Cor: PMI nondisplaced. Regular rate & rhythm. No rubs, gallops or murmurs. Lungs: clear Abdomen: soft, nontender, nondistended. No hepatosplenomegaly. No bruits or masses. Good bowel sounds. Extremities: no cyanosis, clubbing, rash, edema Neuro: alert & orientedx3, cranial nerves grossly intact. moves all 4 extremities w/o difficulty. Affect pleasant   Telemetry: NSR 60s Personally reviewed   Labs: Basic Metabolic Panel:  Recent Labs Lab 02/27/17 0335 02/27/17 0936 02/28/17 0502 03/01/17 0514 03/02/17 0320 03/02/17 0641  NA 141  --  139 136 136  --   K 5.1  --  3.8 3.5 3.4*  --   CL 109  --  101 98* 101  --   CO2 23  --  27 34* 23  --   GLUCOSE 267*  --  115* 101* 115*  --   BUN 8  --  9 12 18   --   CREATININE 1.51*  --  1.18 1.17 1.19  --   CALCIUM 8.6*  --  9.2 9.2 9.0  --   MG  --  2.2  --   --   --  2.4    Liver Function Tests:  Recent Labs Lab  02/27/17 0335  AST 73*  ALT 31  ALKPHOS 94  BILITOT 0.8  PROT 6.5  ALBUMIN 3.7   No results for input(s): LIPASE, AMYLASE in the last 168 hours. No results for input(s): AMMONIA in the last 168 hours.  CBC:  Recent Labs Lab 02/27/17 0335 02/28/17 0502  WBC 9.1 8.1  NEUTROABS 5.4  --   HGB 13.9 14.8  HCT 43.2 45.8  MCV 87.6 86.7  PLT 289 273    Cardiac Enzymes:  Recent Labs Lab 02/27/17 0335 02/27/17 0936 02/27/17 1530 02/27/17 1930  CKTOTAL 492*  --   --   --   TROPONINI  --  0.10* 0.16* 0.10*    BNP: BNP (last 3 results)  Recent Labs  02/27/17 0335  BNP 1,066.9*    ProBNP (last 3 results) No results for input(s): PROBNP in the last 8760 hours.    Other results:  Imaging: No results found.   Medications:     Scheduled Medications: Marland Kitchen [  MAR Hold] aspirin EC  81 mg Oral Daily  . [MAR Hold] carvedilol  25 mg Oral BID WC  . [MAR Hold] enoxaparin (LOVENOX) injection  40 mg Subcutaneous Q24H  . [MAR Hold] nicotine  14 mg Transdermal Daily  . [MAR Hold] rosuvastatin  10 mg Oral Daily  . [MAR Hold] sacubitril-valsartan  1 tablet Oral BID  . [MAR Hold] sodium chloride flush  3 mL Intravenous Q12H  . sodium chloride flush  3 mL Intravenous Q12H  . [MAR Hold] spironolactone  12.5 mg Oral Daily    Infusions: . sodium chloride    . sodium chloride 10 mL/hr at 03/02/17 0248  . heparin      PRN Medications: sodium chloride, [MAR Hold] acetaminophen **OR** [MAR Hold] acetaminophen, [MAR Hold] guaiFENesin, heparin, sodium chloride flush   Assessment:    61 y/o male with severe HTN admitted with HTN crisis and acute systolic HF EF 65-46%   Plan/Discussion:     1. Acute systolic HF - EF 50-35%. Volume status improved. Feeling very good.  - On Entresto, spiro and carvedilol. SBP 130s.  - Possible HTN cardiomyopathy but needs ischemic w/u - R/L cath today   2. Hypertensive crisis - Much improved on new regimen. SBP 130s.  - Stressed ned  for med compliance  3. Tobacco use - discussed need for cessation   4. Acute respiratory failure - resolved with diuresis  5. AKI. - resolved  For cath today. Procedure discussed. Possibly home later this afternoon.   Length of Stay: 1   Sharon Rubis MD 03/02/2017, 10:12 AM  Advanced Heart Failure Team Pager [phone] (M-F; [place])  Please contact [facility] for night-coverage after hours (4p -7a ) and weekends on [email or web address]

## 2017-03-02 NOTE — Progress Notes (Signed)
Patient had discharge order and patient need to receive IVF for four  Hours post cardiac cath then d/c. Will continue to monitor.

## 2017-03-02 NOTE — Progress Notes (Signed)
   Subjective:  Patient seen and examined. He states he is feeling well and that his breathing is back to normal. His only complaint today is a dry cough. Denies shortness of breath or chest pain. The possible outcomes and goals of the cardiac catheterization procedure were explained to the patient.  Objective:  Vital signs in last 24 hours: Vitals:   03/01/17 1452 03/01/17 2157 03/02/17 0615 03/02/17 0916  BP: (!) 154/79 130/86 137/74 138/82  Pulse: 71 80 (!) 36 65  Resp: 18 18 20    Temp: 98.1 F (36.7 C) 98.5 F (36.9 C) 98.2 F (36.8 C)   TempSrc: Oral Oral Oral   SpO2: 100% 100% 98%   Weight:   155 lb 3.2 oz (70.4 kg)   Height:       General: Laying in bed comfortably, NAD HEENT: [place]/AT, EOMI, no scleral icterus Cardiac: RRR Pulm: normal effort, no wheezes or crackles Abd: soft, non tender, non distended, BS normal Ext: extremities well perfused, no peripheral edema Neuro: alert and oriented X3, cranial nerves II-XII grossly intact  Assessment/Plan:  Principal Problem:   HFrEF (heart failure with reduced ejection fraction) (HCC) Active Problems:   Hyperlipidemia   TOBACCO ABUSE   Uncontrolled stage 2 hypertension   Degenerative disc disease, cervical   LVH (left ventricular hypertrophy)   Hypertensive emergency   AKI (acute kidney injury) ([place])  Acute Hypoxic Respiratory Distress 2/2 Hypertensive Emergency and Pulmonary Edema Acute Exacerbation of Systolic and Diastolic HF Hypoxemia has resolved. Blood pressure has improved on new medication regimen. BP today 138/82.  - RHC/LHC today for ischemic evaluation due to new onset HFrEF - Continue current medications: Entresto, Coreg 25mg  BID, Spironolactone 12.5mg  BID - Appreciate cardiology recommendations   AKI Improving. Pre-renal, most likely secondary to acute exacerbation of systolic and diastolic heart failure.   Creatinine stable at 1.19. Patients baseline is around 1.0.  -Repeat BMET in AM  FEN Fluids: NS  10 cc/ hr  Electrolytes: K+ 3.4 today, replete wit KDur, Magnesium 2.4  Nutrition: NPO, advance diet after cath  Dispo: Anticipated discharge in 1 day.  Melanee Spry, MD 03/02/2017, 10:19 AM

## 2017-03-02 NOTE — Care Management Note (Signed)
Case Management Note  Patient Details  Name: Devon Vega MRN: [id number] Date of Birth: February 28, 1956  Subjective/Objective:    CHF, HTN, AKI,                 Action/Plan: Discharge Planning: Spoke to pt and states he currently is employed without insurance coverage. Provided pt with Entresto 30 day free trial card and patient assistance application to have completed at his next appt. Has follow up appt with PCP on 03/10/2017 at 1045 am.  PCP Kathi Ludwig MD  Expected Discharge Date:  03/02/17               Expected Discharge Plan:  Home/Self Care  In-House Referral:  NA  Discharge planning Services  CM Consult, Medication Assistance  Post Acute Care Choice:  NA Choice offered to:  NA  DME Arranged:  N/A DME Agency:  NA  HH Arranged:  NA HH Agency:  NA  Status of Service:  Completed, signed off  If discussed at [place] of Stay Meetings, dates discussed:    Additional Comments:  Erenest Rasher, RN 03/02/2017, 2:43 PM

## 2017-03-02 NOTE — CV Procedure (Signed)
CATH NOTE  Findings:  Ao = 103/53 (75) LV =  121/4 RA = 1 RV = 17/3 PA = 17/5 (10) PCW = 4 Fick cardiac output/index = 3.3/1.9 PVR = 1.9 WU SVR = 1772 FA sat = 93% PA sat = 58%, 58%  Assessment:  1. Normal coronary arteries  2. NICM 35%  3. Low filling pressures with moderately to severely depressed cardiac output  Plan/Discussion:  He has NICM likely due to hypertension. Volume status is low. Will hydrate post-cath. Cut carvedilol back. Can likely go home later today on  Carvedilol 12.5 bid Entresto 24/26 bid (please give 30-day voucher) Arlyce Harman 12.5 daily  F/u HF Clinic 1 week.   Glori Bickers, MD  10:58 AM

## 2017-03-02 NOTE — Progress Notes (Addendum)
Patient discharge home,instruction given and understanding of instruction was displayed by patient using teach back. IV removed and CHF teaching complete. Prescription are at pharmacy. Patient has all of his belonging. Room checked.

## 2017-03-02 NOTE — Progress Notes (Signed)
Site area: rt ac venous sheath Site Prior to Removal:  Level 0 Pressure Applied For: 10 minutes Manual:   yes Patient Status During Pull:  stable Post Pull Site:  Level 0 Post Pull Instructions Given:  yes Post Pull Pulses Present: palpable Dressing Applied:  Gauze and tegaderm Bedrest begins @ na Comments:

## 2017-03-02 NOTE — H&P (View-Only) (Signed)
Advanced Heart Failure Rounding Note   Subjective:     Doing well. No CP or SOB. SBPs remain in 130s.   Objective:   Weight Range:  Vital Signs:   Temp:  [98.1 F (36.7 C)-98.5 F (36.9 C)] 98.2 F (36.8 C) (08/20 0615) Pulse Rate:  [36-80] 65 (08/20 0916) Resp:  [18-20] 20 (08/20 0615) BP: (130-158)/(74-89) 138/82 (08/20 0916) SpO2:  [98 %-100 %] 98 % (08/20 0615) Weight:  [70.4 kg (155 lb 3.2 oz)] 70.4 kg (155 lb 3.2 oz) (08/20 0615) Last BM Date: 03/02/17  Weight change: Filed Weights   02/28/17 0501 03/01/17 0618 03/02/17 0615  Weight: 70 kg (154 lb 4.8 oz) 70.7 kg (155 lb 14.4 oz) 70.4 kg (155 lb 3.2 oz)    Intake/Output:   Intake/Output Summary (Last 24 hours) at 03/02/17 1012 Last data filed at 03/02/17 8676  Gross per 24 hour  Intake           715.17 ml  Output              800 ml  Net           -84.83 ml     Physical Exam: General:  Well appearing. No resp difficulty HEENT: normal Neck: supple. no JVD. Carotids 2+ bilat; no bruits. No lymphadenopathy or thryomegaly appreciated. Cor: PMI nondisplaced. Regular rate & rhythm. No rubs, gallops or murmurs. Lungs: clear Abdomen: soft, nontender, nondistended. No hepatosplenomegaly. No bruits or masses. Good bowel sounds. Extremities: no cyanosis, clubbing, rash, edema Neuro: alert & orientedx3, cranial nerves grossly intact. moves all 4 extremities w/o difficulty. Affect pleasant   Telemetry: NSR 60s Personally reviewed   Labs: Basic Metabolic Panel:  Recent Labs Lab 02/27/17 0335 02/27/17 0936 02/28/17 0502 03/01/17 0514 03/02/17 0320 03/02/17 0641  NA 141  --  139 136 136  --   K 5.1  --  3.8 3.5 3.4*  --   CL 109  --  101 98* 101  --   CO2 23  --  27 34* 23  --   GLUCOSE 267*  --  115* 101* 115*  --   BUN 8  --  9 12 18   --   CREATININE 1.51*  --  1.18 1.17 1.19  --   CALCIUM 8.6*  --  9.2 9.2 9.0  --   MG  --  2.2  --   --   --  2.4    Liver Function Tests:  Recent Labs Lab  02/27/17 0335  AST 73*  ALT 31  ALKPHOS 94  BILITOT 0.8  PROT 6.5  ALBUMIN 3.7   No results for input(s): LIPASE, AMYLASE in the last 168 hours. No results for input(s): AMMONIA in the last 168 hours.  CBC:  Recent Labs Lab 02/27/17 0335 02/28/17 0502  WBC 9.1 8.1  NEUTROABS 5.4  --   HGB 13.9 14.8  HCT 43.2 45.8  MCV 87.6 86.7  PLT 289 273    Cardiac Enzymes:  Recent Labs Lab 02/27/17 0335 02/27/17 0936 02/27/17 1530 02/27/17 1930  CKTOTAL 492*  --   --   --   TROPONINI  --  0.10* 0.16* 0.10*    BNP: BNP (last 3 results)  Recent Labs  02/27/17 0335  BNP 1,066.9*    ProBNP (last 3 results) No results for input(s): PROBNP in the last 8760 hours.    Other results:  Imaging: No results found.   Medications:     Scheduled Medications: Marland Kitchen [  MAR Hold] aspirin EC  81 mg Oral Daily  . [MAR Hold] carvedilol  25 mg Oral BID WC  . [MAR Hold] enoxaparin (LOVENOX) injection  40 mg Subcutaneous Q24H  . [MAR Hold] nicotine  14 mg Transdermal Daily  . [MAR Hold] rosuvastatin  10 mg Oral Daily  . [MAR Hold] sacubitril-valsartan  1 tablet Oral BID  . [MAR Hold] sodium chloride flush  3 mL Intravenous Q12H  . sodium chloride flush  3 mL Intravenous Q12H  . [MAR Hold] spironolactone  12.5 mg Oral Daily    Infusions: . sodium chloride    . sodium chloride 10 mL/hr at 03/02/17 0248  . heparin      PRN Medications: sodium chloride, [MAR Hold] acetaminophen **OR** [MAR Hold] acetaminophen, [MAR Hold] guaiFENesin, heparin, sodium chloride flush   Assessment:    61 y/o male with severe HTN admitted with HTN crisis and acute systolic HF EF 96-22%   Plan/Discussion:     1. Acute systolic HF - EF 29-79%. Volume status improved. Feeling very good.  - On Entresto, spiro and carvedilol. SBP 130s.  - Possible HTN cardiomyopathy but needs ischemic w/u - R/L cath today   2. Hypertensive crisis - Much improved on new regimen. SBP 130s.  - Stressed ned  for med compliance  3. Tobacco use - discussed need for cessation   4. Acute respiratory failure - resolved with diuresis  5. AKI. - resolved  For cath today. Procedure discussed. Possibly home later this afternoon.   Length of Stay: 1   Audriana Aldama MD 03/02/2017, 10:12 AM  Advanced Heart Failure Team Pager [phone] (M-F; [place])  Please contact [facility] for night-coverage after hours (4p -7a ) and weekends on [email or web address]

## 2017-03-02 NOTE — Progress Notes (Signed)
TR Band off at 1145am  Level 0 at site clean dry drssing applied with [place].

## 2017-03-02 NOTE — Interval H&P Note (Signed)
History and Physical Interval Note:  03/02/2017 10:15 AM  Devon Vega  has presented today for surgery, with the diagnosis of chf  The various methods of treatment have been discussed with the patient and family. After consideration of risks, benefits and other options for treatment, the patient has consented to  Procedure(s): RIGHT/LEFT HEART CATH AND CORONARY ANGIOGRAPHY (N/A) possible coronary angioplasty as a surgical intervention .  The patient's history has been reviewed, patient examined, no change in status, stable for surgery.  I have reviewed the patient's chart and labs.  Questions were answered to the patient's satisfaction.     Bensimhon, Quillian Quince

## 2017-03-02 NOTE — Discharge Summary (Signed)
Name: Devon Vega MRN: [id number] DOB: June 28, 1956 61 y.o. PCP: Kathi Ludwig, MD  Date of Admission: 02/27/2017  3:28 AM Date of Discharge: 03/02/2017 Attending Physician: Axel Filler, *  Discharge Diagnosis: 1. Heart failure with reduced ejection fraction, Nonischemic Cardiomyopathy  Principal Problem:   HFrEF (heart failure with reduced ejection fraction) (HCC) Active Problems:   Hyperlipidemia   TOBACCO ABUSE   Uncontrolled stage 2 hypertension   LVH (left ventricular hypertrophy)   Hypertensive emergency   AKI (acute kidney injury) ([place])   CHF (congestive heart failure) (HCC)   NICM (nonischemic cardiomyopathy) ([place])   Discharge Medications: Allergies as of 03/02/2017      Reactions   Codeine    REACTION: Unknown reaction   Lisinopril    REACTION: cough      Medication List    TAKE these medications   carvedilol 12.5 MG tablet Commonly known as:  COREG Take 1 tablet (12.5 mg total) by mouth 2 (two) times daily with a meal. What changed:  medication strength  how much to take   fluticasone 50 MCG/ACT nasal spray Commonly known as:  FLONASE Place 2 sprays into both nostrils daily. What changed:  when to take this  reasons to take this   ibuprofen 200 MG tablet Commonly known as:  ADVIL,MOTRIN Take 400 mg by mouth every 6 (six) hours as needed for mild pain.   rosuvastatin 10 MG tablet Commonly known as:  CRESTOR Take 1 tablet (10 mg total) by mouth daily.   sacubitril-valsartan 24-26 MG Commonly known as:  ENTRESTO Take 1 tablet by mouth 2 (two) times daily.   spironolactone 25 MG tablet Commonly known as:  ALDACTONE Take 0.5 tablets (12.5 mg total) by mouth daily.       Disposition and follow-up:   Devon Vega was [facility] from [facility] in Good condition.  At the hospital follow up visit please address:  1. Blood pressure, new blood pressure medications, results of cardiac  catheterization, follow up in Heart Failure clinic  2.  Labs / imaging needed at time of follow-up: BMET, CBC, PT/INR  3.  Pending labs/ test needing follow-up: None  Follow-up Appointments:   Internal Medicine Clinic at [facility]: August 28, 10:15 AM.   Hospital Course by problem list: Principal Problem:   HFrEF (heart failure with reduced ejection fraction) (HCC) Active Problems:   Hyperlipidemia   TOBACCO ABUSE   Uncontrolled stage 2 hypertension   LVH (left ventricular hypertrophy)   Hypertensive emergency   AKI (acute kidney injury) ([place])   CHF (congestive heart failure) ([place])   NICM (nonischemic cardiomyopathy) ([place])   1. Heart Failure with reduced ejection fraction, Nonischemic Cardiomyopathy Devon Vega was admitted to [facility] and the Internal Medicine teaching service for acute onset shortness of breath. Upon initial presentation, he was hypoxemic on BiPAP, hypertensive at 183/117, and with pulmonary edema noted on chest xray and physical exam. He was subsequently diuresed and his blood pressure was reduced with a nicardipine drip. He responded well to the nicardipine drip and BiPAP, which were discontinued in the emergency department. An echocardiogram was performed and showed moderate to severely reduced systolic function, ejection fraction estimated at 30% to 35%, and diffuse hypokinesis. Cardiology was consulted for ischemic evaluation. Per their recommendations he received a left and right heart catheterization and coronary angiography. He tolerated the procedure well. The results of the procedure showed normal coronary arteries with non-ischemic cardiomyopathy of 35%. The catheterization also  showed low volume status and he was hydrated with IV fluids after the procedure. He was started on Entresto and Spironolactone, with continuation of his Carvedilol at a reduced dose. His blood pressure remained stable for the duration of his hospitalization and he  did not experience any shortness of breath.   2. Acute Kidney Injury Creatinine was elevated upon initial presentation to 1.51 most likely secondary to acute HFrEF. Creatinine trended down appropriately during his hospitalization.   Discharge Vitals:   BP 116/75   Pulse 61   Temp 98.2 F (36.8 C) (Oral)   Resp (!) 21   Ht 5\' 5"  (1.651 m)   Wt 155 lb 3.2 oz (70.4 kg)   SpO2 96%   BMI 25.83 kg/m   Pertinent Labs, Studies, and Procedures:  BMP Latest Ref Rng & Units 03/02/2017 03/01/2017 02/28/2017  Glucose 65 - 99 mg/dL 115(H) 101(H) 115(H)  BUN 6 - 20 mg/dL 18 12 9   Creatinine 0.61 - 1.24 mg/dL 1.19 1.17 1.18  BUN/Creat Ratio 10 - 22 - - -  Sodium 135 - 145 mmol/L 136 136 139  Potassium 3.5 - 5.1 mmol/L 3.4(L) 3.5 3.8  Chloride 101 - 111 mmol/L 101 98(L) 101  CO2 22 - 32 mmol/L 23 34(H) 27  Calcium 8.9 - 10.3 mg/dL 9.0 9.2 9.2   CBC Latest Ref Rng & Units 02/28/2017 02/27/2017 02/03/2013  WBC 4.0 - 10.5 K/uL 8.1 9.1 -  Hemoglobin 13.0 - 17.0 g/dL 14.8 13.9 15.1  Hematocrit 39.0 - 52.0 % 45.8 43.2 -  Platelets 150 - 400 K/uL 273 289 -   Lipid Panel     Component Value Date/Time   CHOL 183 03/01/2017 0514   TRIG 194 (H) 03/01/2017 0514   HDL 45 03/01/2017 0514   CHOLHDL 4.1 03/01/2017 0514   VLDL 39 03/01/2017 0514   LDLCALC 99 03/01/2017 0514   Transthoracic Echocardiogram Study Conclusions: - Left ventricle: The cavity size was mildly dilated. There was   mild-moderate concentric hypertrophy. Systolic function was   moderately to severely reduced. The estimated ejection fraction   was in the range of 30% to 35%. Diffuse hypokinesis. Doppler   parameters are consistent with abnormal left ventricular   relaxation (grade 1 diastolic dysfunction). Doppler parameters   are consistent with indeterminate ventricular filling pressure. - Aortic valve: Transvalvular velocity was within the normal range.   There was no stenosis. There was moderate regurgitation.   Regurgitation  pressure half-time: 440 ms. - Mitral valve: Transvalvular velocity was within the normal range.   There was no evidence for stenosis. There was no regurgitation. - Right ventricle: The cavity size was normal. Wall thickness was   normal. Systolic function was normal. - Atrial septum: No defect or patent foramen ovale was identified   by color flow Doppler. - Tricuspid valve: There was mild regurgitation. - Pulmonary arteries: Systolic pressure was within the normal   range. PA peak pressure: 28 mm Hg (S).   RIGHT/LEFT HEART CATH AND CORONARY ANGIOGRAPHY  Conclusion  Findings: Ao = 103/53 (75) LV =  121/4 RA = 1 RV = 17/3 PA = 17/5 (10) PCW = 4 Fick cardiac output/index = 3.3/1.9 PVR = 1.9 WU SVR = 1772 FA sat = 93% PA sat = 58%, 58% Assessment:  1. Normal coronary arteries  2. NICM 35%  3. Low filling pressures with moderately to severely depressed cardiac output     Discharge Instructions: Discharge Instructions    Call MD for:  difficulty breathing,  headache or visual disturbances    Complete by:  As directed    Diet - low sodium heart healthy    Complete by:  As directed    Discharge instructions    Complete by:  As directed    Please discharge after patient receives about 4 hours of IV fluids per Cardiology recommendations. Please make sure the patient gets a meal prior to discharge.  Devon Vega,  Several of your blood pressure medications have changed. Please stop Amlodipine, Losartan, and Hydrochlorothiazide.   You will continue to take Carvedilol (Coreg) but not the same dose as prior to your hospitalization. Take 12.5 mg of Carvedilol twice daily.   You were started on a medication called Spironolactone. Please take Spironolactone 12.5 mg daily.   You were also started on a medication called Entresto. Please take Entresto 24/26 mg twice daily.   Please follow up in the Internal Medicine Clinic at [facility] on Tuesday, August 28 at 10:15 AM.  Please follow up with Cardiology in one week, they will contact you with an appointment time.   Increase activity slowly    Complete by:  As directed       Signed: Melanee Spry, MD 03/02/2017, 1:26 PM   Pager: [phone]

## 2017-03-06 ENCOUNTER — Telehealth: Payer: Self-pay | Admitting: *Deleted

## 2017-03-06 NOTE — Telephone Encounter (Signed)
Patient had called Devon Vega requesting med for cough.  Was seen in ER 8/17 (has chronic cough, COPD). No cough med on medlist. Has appt in [facility] 8/28. pls advise!

## 2017-03-09 NOTE — Telephone Encounter (Signed)
Cough can be addressed in [facility] visit. Thanks.

## 2017-03-10 ENCOUNTER — Encounter: Payer: Self-pay | Admitting: Internal Medicine

## 2017-03-10 ENCOUNTER — Ambulatory Visit (INDEPENDENT_AMBULATORY_CARE_PROVIDER_SITE_OTHER): Payer: Self-pay | Admitting: Internal Medicine

## 2017-03-10 VITALS — BP 185/95 | HR 87 | Temp 98.0°F | Ht 65.0 in | Wt 160.2 lb

## 2017-03-10 DIAGNOSIS — Z8249 Family history of ischemic heart disease and other diseases of the circulatory system: Secondary | ICD-10-CM

## 2017-03-10 DIAGNOSIS — I5020 Unspecified systolic (congestive) heart failure: Secondary | ICD-10-CM

## 2017-03-10 DIAGNOSIS — R053 Chronic cough: Secondary | ICD-10-CM

## 2017-03-10 DIAGNOSIS — F17290 Nicotine dependence, other tobacco product, uncomplicated: Secondary | ICD-10-CM

## 2017-03-10 DIAGNOSIS — Z79899 Other long term (current) drug therapy: Secondary | ICD-10-CM

## 2017-03-10 DIAGNOSIS — F17210 Nicotine dependence, cigarettes, uncomplicated: Secondary | ICD-10-CM

## 2017-03-10 DIAGNOSIS — I5021 Acute systolic (congestive) heart failure: Secondary | ICD-10-CM

## 2017-03-10 DIAGNOSIS — I10 Essential (primary) hypertension: Secondary | ICD-10-CM

## 2017-03-10 DIAGNOSIS — R05 Cough: Secondary | ICD-10-CM

## 2017-03-10 DIAGNOSIS — I110 Hypertensive heart disease with heart failure: Secondary | ICD-10-CM

## 2017-03-10 HISTORY — DX: Chronic cough: R05.3

## 2017-03-10 LAB — PROTIME-INR
INR: 0.99
Prothrombin Time: 13 seconds (ref 11.4–15.2)

## 2017-03-10 NOTE — Progress Notes (Signed)
   CC: Hospital follow-up after new diagnosis of HFrEF  HPI:  [name] is a 61 y.o. male with PMH listed below who presents to clinic for hospital follow-up after new diagnosis of HFrEF. Please see problem based assessment and plan for further details.  Past Medical History:  Diagnosis Date  . Alcohol abuse    hx of , sober since 1997  . COPD (chronic obstructive pulmonary disease) ([place])   . Hyperlipidemia   . Hypertension   . 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 Sinus arrhythmia 05/24/2014  . Tobacco abuse    failed zyban  . Weight gain    Review of Systems:   Review of Systems  Constitutional: Negative for chills and fever.  Eyes: Negative for blurred vision and double vision.  Respiratory: Positive for cough. Negative for hemoptysis, sputum production, shortness of breath and wheezing.   Cardiovascular: Negative for chest pain, palpitations, orthopnea, leg swelling and PND.  Gastrointestinal: Negative for abdominal pain, constipation, diarrhea, nausea and vomiting.    Physical Exam:  Vitals:   03/10/17 1045  BP: (!) 191/101  Pulse: 93  Temp: 98 F (36.7 C)  TempSrc: Oral  SpO2: 100%  Weight: 160 lb 3.2 oz (72.7 kg)   General: pleasant male, well-nourished, well-developed, in no acute distress  HENT: NCAT,neck supple and FROM, MMM, OP clear without exudates or erythema   Cardiac: regular rate and rhythm, nl S1/S2, no S3/S4, no murmurs, rubs or gallops, No JVD noted Pulm: CTAB, no wheezes or crackles, no increased work of breathing  Abd: soft, NTND, bowel sounds present  Ext: warm and well perfused, no peripheral edema     Assessment & Plan:   See Encounters Tab for problem based charting.  Patient seen with Dr. Angelia Mould

## 2017-03-10 NOTE — Patient Instructions (Signed)
It was nice to meet you today, Devon Vega.   Please call the Heart Failure clinic to set up an appointment with your cardiologist. The number is [phone].  Please continue taking your medication as prescribed.  We encouraged you to stop smoking. This will help with your dry cough.  I will call you results from your blood tests are not normal.  Please call internal medicine clinic or go to the ED if you develop shortness of breath suddenly.

## 2017-03-10 NOTE — Assessment & Plan Note (Deleted)
Recently admitted for HF exacerbation in the setting of hypertensive crisis. Patient states he has been in his usual state of health since discharge. His only complaint today is a dry cough that started prior to his admission and has not resolved. States it has been present for months and has not improved. Per patient, he has a history of COPD, however I cannot find evidence of this on his chart and he is not on any medications for COPD. Denies SOB, DOE, hemoptysis, wheezing,  fever, chills, sore throat, and sputum production.  He presented with pulmonary edema, but lung exam normal today. Therefore low suspicion for ongoing pulmonary edema. He is a smoker. Reports he recently started to cut back, but continues to smoke 3-5 cigarettes per day.

## 2017-03-10 NOTE — Assessment & Plan Note (Signed)
BP today 191/101 and 189/95 on repeat. Previously on Coreg, losartan, HCTZ, and amlodipine. Antihypertensive medications recently changed after hospitalization for hypertensive emergency, HF, and AKI. He was discharged on Coreg 12.5 BID, Entresto 24-26 BID, and spironolactone 12.5 daily. Patient reports compliance with medications. However, he is currently taking spironolactone 50 mg daily. He believes BP is elevated because he did not take Entresto this morning. Denies headache, blurred vision, chest pain, shortness of breath.   He has not follow-up with cardiology after discharge. Was told HF clinic will contact him for an appointment, but has not received a call from them.  HTN: uncontrolled  - No changes in antihypertensive medications at this time - Stressed importance of follow-up with cardiology at HF clinic for further BP management. Provided phone number for clinic and asked patient to call clinic today to make an appointment. - Encouraged medication compliance. Explained to patient potential consequences of significantly elevated BP. Patient voiced understanding.

## 2017-03-10 NOTE — Progress Notes (Signed)
Internal Medicine Clinic Attending  I saw and evaluated the patient.  I personally confirmed the key portions of the history and exam documented by Dr. Isac Sarna and I reviewed pertinent patient test results.  The assessment, diagnosis, and plan were formulated together and I agree with the documentation in the resident's note. I also discussed with patient importance of adherence with medication and follow up with HF office.  He is agreeable and will call for appointment today.  I discussed checking weights daily.  Wt appears up 5 lbs since discharge weight however it was also noted on day of discharge that he was hypovolemic by cath and given IVF, not sure if weight was taken before fluids as his lungs are CTA and no pedal edema currently.

## 2017-03-10 NOTE — Assessment & Plan Note (Signed)
Recently admitted for HF exacerbation in the setting of hypertensive crisis. Patient states he has been in his usual state of health since discharge. His only complaint today is a dry cough that started prior to his admission (" months ago") and has not resolved. Did not improve during admission. Per patient, he has a history of COPD, however I cannot find evidence of this on his chart and he is not on any medications for COPD. Denies SOB, DOE, hemoptysis, wheezing,  fever, chills, sore throat, and sputum production.  He presented with pulmonary edema to the ED, but lung exam normal today. Therefore low suspicion for ongoing pulmonary edema. He is a smoker. Reports he recently started to cut back, but continues to smoke 3-5 cigarettes per day. Low suspicion for malignancy as he reports no B symptoms and no findings on recent chest x-ray. He is not on an ACE inhibitor. Suspect patient's dry cough is secondary to smoking. He has been able to quit smoking in the past by himself and lasted 1 year.  Chronic cough: - Recommended smoking cessation, which patient is agreeable to.  - Declined smoking cessation aids. Advised patient to call [facility] clinic any time if he would like to try smoking cessation aids.

## 2017-03-10 NOTE — Assessment & Plan Note (Addendum)
Patient was admitted on 8/20 for acute onset shortness of breath, hypoxemia, and AKI and was found to have HFrEF with an EF 30-35%. Responded well to BiPAP and was started on Entresto 24-26 and spironolactone 12.5 daily. Coreg dose reduced to 12.5 BID. Cardiology consulted and ischemic evaluation performed. LHC with non-ischemic coronary arteries.  Patient states she's been in his usual state of health since discharge. His only complaint today is a dry cough that started prior to his admission and has not resolved. Per patient he has a history of COPD, however I cannot find evidence of this on his chart and he is not on any medications for COPD. Denies chest pain, SOB, DOE, hemoptysis, wheezing,  fever, chills, sore throat, and sputum production. Has not been contacted by heart failure clinic to set up a follow-up appointment with cardiology.  HFrEF secondary to NICM: likely secondary to uncontrolled HTN  - CBC, BMP and PT/ INR  - Patient advised to call HF clinic to make an appointment with cardiologist as soon as possible for BP management. I provided patient with HF clinicphone number and stressed the importance of scheduling an appt several times during today's visit.

## 2017-03-11 LAB — CBC
Hematocrit: 44.8 % (ref 37.5–51.0)
Hemoglobin: 14.7 g/dL (ref 13.0–17.7)
MCH: 28.3 pg (ref 26.6–33.0)
MCHC: 32.8 g/dL (ref 31.5–35.7)
MCV: 86 fL (ref 79–97)
Platelets: 335 10*3/uL (ref 150–379)
RBC: 5.2 x10E6/uL (ref 4.14–5.80)
RDW: 16.2 % — ABNORMAL HIGH (ref 12.3–15.4)
WBC: 5.6 10*3/uL (ref 3.4–10.8)

## 2017-03-11 LAB — BMP8+ANION GAP
Anion Gap: 16 mmol/L (ref 10.0–18.0)
BUN/Creatinine Ratio: 14 (ref 10–24)
BUN: 14 mg/dL (ref 8–27)
CO2: 22 mmol/L (ref 20–29)
Calcium: 10.1 mg/dL (ref 8.6–10.2)
Chloride: 98 mmol/L (ref 96–106)
Creatinine, Ser: 1.03 mg/dL (ref 0.76–1.27)
GFR calc Af Amer: 89 mL/min/{1.73_m2} (ref >59)
GFR calc non Af Amer: 77 mL/min/{1.73_m2} (ref >59)
Glucose: 109 mg/dL — ABNORMAL HIGH (ref 65–99)
Potassium: 4.8 mmol/L (ref 3.5–5.2)
Sodium: 136 mmol/L (ref 134–144)

## 2017-03-18 ENCOUNTER — Ambulatory Visit (HOSPITAL_COMMUNITY)
Admission: RE | Admit: 2017-03-18 | Discharge: 2017-03-18 | Disposition: A | Discharge status: Home / Self Care | Payer: Self-pay | Source: Ambulatory Visit | Attending: Internal Medicine | Admitting: Internal Medicine

## 2017-03-18 ENCOUNTER — Encounter (HOSPITAL_COMMUNITY): Payer: Self-pay

## 2017-03-18 VITALS — BP 178/104 | HR 89 | Ht 65.0 in | Wt 158.4 lb

## 2017-03-18 DIAGNOSIS — I5022 Chronic systolic (congestive) heart failure: Secondary | ICD-10-CM | POA: Insufficient documentation

## 2017-03-18 DIAGNOSIS — N179 Acute kidney failure, unspecified: Secondary | ICD-10-CM | POA: Insufficient documentation

## 2017-03-18 DIAGNOSIS — I10 Essential (primary) hypertension: Secondary | ICD-10-CM

## 2017-03-18 DIAGNOSIS — I499 Cardiac arrhythmia, unspecified: Secondary | ICD-10-CM

## 2017-03-18 DIAGNOSIS — F17200 Nicotine dependence, unspecified, uncomplicated: Secondary | ICD-10-CM

## 2017-03-18 DIAGNOSIS — F1010 Alcohol abuse, uncomplicated: Secondary | ICD-10-CM

## 2017-03-18 DIAGNOSIS — Z7982 Long term (current) use of aspirin: Secondary | ICD-10-CM | POA: Insufficient documentation

## 2017-03-18 DIAGNOSIS — E785 Hyperlipidemia, unspecified: Secondary | ICD-10-CM | POA: Insufficient documentation

## 2017-03-18 DIAGNOSIS — I498 Other specified cardiac arrhythmias: Secondary | ICD-10-CM

## 2017-03-18 DIAGNOSIS — I110 Hypertensive heart disease with heart failure: Secondary | ICD-10-CM | POA: Insufficient documentation

## 2017-03-18 DIAGNOSIS — J449 Chronic obstructive pulmonary disease, unspecified: Secondary | ICD-10-CM | POA: Insufficient documentation

## 2017-03-18 DIAGNOSIS — I429 Cardiomyopathy, unspecified: Secondary | ICD-10-CM | POA: Insufficient documentation

## 2017-03-18 DIAGNOSIS — F17210 Nicotine dependence, cigarettes, uncomplicated: Secondary | ICD-10-CM | POA: Insufficient documentation

## 2017-03-18 DIAGNOSIS — Z720 Tobacco use: Secondary | ICD-10-CM | POA: Insufficient documentation

## 2017-03-18 LAB — BASIC METABOLIC PANEL
Anion gap: 8 (ref 5–15)
BUN: 9 mg/dL (ref 6–20)
CO2: 27 mmol/L (ref 22–32)
Calcium: 9.2 mg/dL (ref 8.9–10.3)
Chloride: 106 mmol/L (ref 101–111)
Creatinine, Ser: 1.25 mg/dL — ABNORMAL HIGH (ref 0.61–1.24)
GFR calc Af Amer: >60 mL/min (ref >60)
GFR calc non Af Amer: 60 mL/min — ABNORMAL LOW (ref >60)
Glucose, Bld: 102 mg/dL — ABNORMAL HIGH (ref 65–99)
Potassium: 4.2 mmol/L (ref 3.5–5.1)
Sodium: 141 mmol/L (ref 135–145)

## 2017-03-18 MED ORDER — SPIRONOLACTONE 25 MG PO TABS: 25.0000 mg | ORAL_TABLET | Freq: Every day | ORAL | 6 refills | Status: DC

## 2017-03-18 MED ORDER — SACUBITRIL-VALSARTAN 49-51 MG PO TABS: 1.0000 | ORAL_TABLET | Freq: Two times a day (BID) | ORAL | 11 refills | Status: DC

## 2017-03-18 MED ORDER — SACUBITRIL-VALSARTAN 49-51 MG PO TABS: 1.0000 | ORAL_TABLET | Freq: Two times a day (BID) | ORAL | 6 refills | Status: DC

## 2017-03-18 NOTE — Progress Notes (Signed)
Advanced Heart Failure Medication Review by a Pharmacist  Does the patient  feel that his/her medications are working for him/her?  yes  Has the patient been experiencing any side effects to the medications prescribed?  no  Does the patient measure his/her own blood pressure or blood glucose at home?  no   Does the patient have any problems obtaining medications due to transportation or finances?   Yes - no Rx insurance, filling out Entresto PAF application  Understanding of regimen: good Understanding of indications: good Potential of compliance: good Patient understands to avoid NSAIDs. Patient understands to avoid decongestants.  Issues to address at subsequent visits: None   Pharmacist comments: Devon Vega is a pleasant 61 yo M presenting with his medication bottles. He reports good compliance with his regimen but I did notice that he is taking spironolactone 50 mg daily instead of 12.5 mg daily. It looks like he was previously on this dose and just continued it based on the directions on the bottle. No other medication-related questions or concerns for me at this time.   [name] Velva Harman, PharmD, BCPS, CPP Clinical Pharmacist Pager: [phone] Phone: [phone] 03/18/2017 11:41 AM      Time with patient: 10 minutes Preparation and documentation time: 10 minutes Total time: 20 minutes

## 2017-03-18 NOTE — Patient Instructions (Addendum)
Routine lab work today. Will notify you of abnormal results, otherwise no news is good news!  INCREASE Entresto to 49/51 mg twice daily. May double up on your current 24/26 mg tablets you have at home (Take 2 tabs twice daily). Use the 49/51 mg tablet samples afterwards (Take 1 tab twice daily). Will enroll you in [employer] patient assistance foundation for [employer]. Contact Ileene Patrick CHF clinical Pharamcist for any further questions/concerns/needs.               DECREASE Spironolactone to 25 mg once daily. May half current 50 mg tablets you have at home (Take 1/2 tablet once daily). New Rx has been sent for 25 mg tablets (Take 1 tab once daily).  Return in 2 weeks for repeat labs.  _________________________________________________________________  _________________________________________________________________  Follow up 4 weeks with Oda Kilts PA-C.  _________________________________________________________________  _________________________________________________________________  Take all medication as prescribed the day of your appointment. Bring all medications with you to your appointment.  Do the following things EVERYDAY: 1) Weigh yourself in the morning before breakfast. Write it down and keep it in a log. 2) Take your medicines as prescribed 3) Eat low salt foods-Limit salt (sodium) to 2000 mg per day.  4) Stay as active as you can everyday 5) Limit all fluids for the day to less than 2 liters

## 2017-03-18 NOTE — Progress Notes (Signed)
Advanced Heart Failure Clinic Note   Primary Care: Dr. Berline Lopes Primary Cardiologist: Dr. Haroldine Laws   HPI:  Devon Vega is a 61 y.o. 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   Admitted 8/17-8/20/18 with acute dyspnea. Found to have marked hypertension in 180s and tachycardia in 110s. Medications adjusted with symptomatic improvement.  Echo with depressed EF as below.  Cath 03/02/17 with No CAD and low filling pressures.   He presents today for post hospital follow up. BP elevated on arrival.  States he took medications around 9 am this morning. He states his breathing has been stable since discharge. Walks 15 minutes every evening up and down hills without SOB. Denies CP.  Mild lightheadedness after taking morning medications, not limiting.  Continues to smoke 4-5 cigarettes a day. Denies ETOH use. Up to 2 weeks ago was drinking 4-6 beers a day for 6 months. No drug use.   Echo 02/27/17 LVEF 30-35%, Grade 1 DD, Mod AS, Mild TR, PA peak pressure 28 mm Hg.   L/RHC 03/02/17 Ao = 103/53 (75) LV =  121/4 RA = 1 RV = 17/3 PA = 17/5 (10) PCW = 4 Fick cardiac output/index = 3.3/1.9 PVR = 1.9 WU SVR = 1772 FA sat = 93% PA sat = 58%, 58%  Assessment:  1. Normal coronary arteries  2. NICM 35%  3. Low filling pressures with moderately to severely depressed cardiac output  Review of systems complete and found to be negative unless listed in HPI.    Past Medical History:  Diagnosis Date  . Alcohol abuse    hx of , sober since 1997  . COPD (chronic obstructive pulmonary disease) ([place])   . Hyperlipidemia   . Hypertension   . 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 Sinus arrhythmia 05/24/2014  . Tobacco abuse    failed zyban  . Weight gain     Current Outpatient Prescriptions  Medication Sig Dispense Refill  . aspirin EC 81 MG tablet Take 81 mg by mouth daily.    . carvedilol (COREG) 25 MG tablet Take  12.5 mg by mouth 2 (two) times daily with a meal.    . fluticasone (FLONASE) 50 MCG/ACT nasal spray Place 1 spray into both nostrils daily as needed for allergies or rhinitis.    Marland Kitchen ibuprofen (ADVIL,MOTRIN) 200 MG tablet Take 400 mg by mouth every 6 (six) hours as needed for mild pain.    . rosuvastatin (CRESTOR) 10 MG tablet Take 1 tablet (10 mg total) by mouth daily. 90 tablet 3  . sacubitril-valsartan (ENTRESTO) 24-26 MG Take 1 tablet by mouth 2 (two) times daily. 60 tablet 0  . spironolactone (ALDACTONE) 50 MG tablet Take 50 mg by mouth daily.     No current facility-administered medications for this encounter.     Allergies  Allergen Reactions  . Codeine Cough  . Lisinopril     REACTION: cough      Social History   Social History  . Marital status: Divorced    Spouse name: N/A  . Number of children: N/A  . Years of education: N/A   Occupational History  . Not on file.   Social History Main Topics  . Smoking status: Current Every Day Smoker    Packs/day: 0.75    Types: Cigarettes, Cigars  . Smokeless tobacco: Never Used     Comment: pt knows he neesd to quit  . Alcohol  use 0.0 oz/week     Comment: 2 beers/day  . Drug use: Yes    Types: Marijuana  . Sexual activity: Not on file   Other Topics Concern  . Not on file   Social History Narrative   Works for [employer]. 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 Cancer Father   . Renal Disease Brother        On Dialysis  . Heart failure Brother   . Hypertension Brother   . Hypertension Mother     Vitals:   03/18/17 1119  BP: (!) 178/104  Pulse: 89  SpO2: 100%  Weight: 158 lb 6.4 oz (71.8 kg)  Height: 5\' 5"  (1.651 m)   Wt Readings from  Last 3 Encounters:  03/18/17 158 lb 6.4 oz (71.8 kg)  03/10/17 160 lb 3.2 oz (72.7 kg)  03/02/17 155 lb 3.2 oz (70.4 kg)   PHYSICAL EXAM: General:  Well appearing. No respiratory difficulty HEENT: normal Neck: supple. no JVD. Carotids 2+ bilat; no bruits. No lymphadenopathy or thyromegaly appreciated. Cor: PMI nondisplaced. Regular rate & rhythm. No rubs, gallops or murmurs. Lungs: clear Abdomen: soft, nontender, nondistended. No hepatosplenomegaly. No bruits or masses. Good bowel sounds. Extremities: no cyanosis, clubbing, rash, edema Neuro: alert & oriented x 3, cranial nerves grossly intact. moves all 4 extremities w/o difficulty. Affect pleasant.  ASSESSMENT & PLAN:  1. Chronic systolic HF - Echo 5/39/76 LVEF 35-40% - R/LHC 03/02/17 with normal coronaries and low filling pressures.  - NYHA II symptoms - Volume status stable on exam - Increase entresto to 49/51 mg BID. BMET today and 10 days.  - Decrease spiro to 25 mg daily.  (The 50 mg tablets he was taking were from 2015, so there is no way to tell the effective dose he was getting).  - Possible HTN cardiomyopathy vs ETOH cardiomyopathy. No CAD.   2. Hypertensive urgency - Elevated into 160-170s on arrival. Meds as above - Have asked to keep blood pressure log for next 2 weeks and bring to lab visit.   3. Tobacco use - Encouraged complete cessation.   4. ETOH abuse - Now abstinent. Encouraged to continue.   5. Recent AKI - Labs today.   Meds as above. Labs today. RTC 4 weeks. Will plan on repeat echo after med optimization.   Shirley Friar, PA-C 03/18/17   Greater than 50% of the 25 minute visit was spent in counseling/coordination of care regarding disease state education, salt/fluid restriction, social work, and medication reconciliation.

## 2017-03-18 NOTE — Addendum Note (Signed)
Encounter addended by: Louann Liv, LCSW on: 03/18/2017  2:16 PM<BR>    Actions taken: Sign clinical note

## 2017-03-18 NOTE — Progress Notes (Signed)
Patient referred due to no insurance. 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 Patient aware of Medicare eligibility at age 61. Patient informed of ability to purchase insurance through [employer] site during open enrollment come this Fall. CSW provided information on [facility] Discount program for immediate assistance. Patient verbalizes understanding and will follow up. CSW available if needed./ Raquel Sarna, Marlinda Mike, [place]

## 2017-03-24 ENCOUNTER — Telehealth (HOSPITAL_COMMUNITY): Payer: Self-pay | Admitting: Pharmacist

## 2017-03-24 NOTE — Telephone Encounter (Signed)
[employer] patient assistance approved for Entresto 49-51 mg BID through 03/21/18.   [name] Velva Harman, PharmD, BCPS, CPP Clinical Pharmacist Pager: [phone] Phone: [phone] 03/24/2017 2:09 PM

## 2017-04-01 ENCOUNTER — Ambulatory Visit (HOSPITAL_COMMUNITY)
Admission: RE | Admit: 2017-04-01 | Discharge: 2017-04-01 | Disposition: A | Discharge status: Home / Self Care | Payer: Self-pay | Source: Ambulatory Visit | Attending: Internal Medicine | Admitting: Internal Medicine

## 2017-04-01 DIAGNOSIS — I5022 Chronic systolic (congestive) heart failure: Secondary | ICD-10-CM | POA: Insufficient documentation

## 2017-04-01 LAB — BASIC METABOLIC PANEL
Anion gap: 8 (ref 5–15)
BUN: 8 mg/dL (ref 6–20)
CO2: 25 mmol/L (ref 22–32)
Calcium: 9 mg/dL (ref 8.9–10.3)
Chloride: 106 mmol/L (ref 101–111)
Creatinine, Ser: 1.03 mg/dL (ref 0.61–1.24)
GFR calc Af Amer: >60 mL/min (ref >60)
GFR calc non Af Amer: >60 mL/min (ref >60)
Glucose, Bld: 111 mg/dL — ABNORMAL HIGH (ref 65–99)
Potassium: 3.9 mmol/L (ref 3.5–5.1)
Sodium: 139 mmol/L (ref 135–145)

## 2017-04-07 ENCOUNTER — Observation Stay (HOSPITAL_COMMUNITY)
Admission: EM | Admit: 2017-04-07 | Discharge: 2017-04-08 | Disposition: A | Discharge status: Home / Self Care | Payer: Self-pay | Attending: Cardiology | Admitting: Cardiology

## 2017-04-07 ENCOUNTER — Encounter (HOSPITAL_COMMUNITY): Payer: Self-pay

## 2017-04-07 ENCOUNTER — Emergency Department (HOSPITAL_COMMUNITY): Payer: Self-pay

## 2017-04-07 DIAGNOSIS — Z885 Allergy status to narcotic agent status: Secondary | ICD-10-CM | POA: Insufficient documentation

## 2017-04-07 DIAGNOSIS — J9600 Acute respiratory failure, unspecified whether with hypoxia or hypercapnia: Secondary | ICD-10-CM | POA: Insufficient documentation

## 2017-04-07 DIAGNOSIS — Z9119 Patient's noncompliance with other medical treatment and regimen: Secondary | ICD-10-CM | POA: Insufficient documentation

## 2017-04-07 DIAGNOSIS — I160 Hypertensive urgency: Secondary | ICD-10-CM | POA: Insufficient documentation

## 2017-04-07 DIAGNOSIS — Z9889 Other specified postprocedural states: Secondary | ICD-10-CM | POA: Insufficient documentation

## 2017-04-07 DIAGNOSIS — J449 Chronic obstructive pulmonary disease, unspecified: Secondary | ICD-10-CM | POA: Insufficient documentation

## 2017-04-07 DIAGNOSIS — Z79899 Other long term (current) drug therapy: Secondary | ICD-10-CM | POA: Insufficient documentation

## 2017-04-07 DIAGNOSIS — R4182 Altered mental status, unspecified: Secondary | ICD-10-CM | POA: Insufficient documentation

## 2017-04-07 DIAGNOSIS — E785 Hyperlipidemia, unspecified: Secondary | ICD-10-CM | POA: Insufficient documentation

## 2017-04-07 DIAGNOSIS — I5020 Unspecified systolic (congestive) heart failure: Secondary | ICD-10-CM

## 2017-04-07 DIAGNOSIS — R0602 Shortness of breath: Secondary | ICD-10-CM

## 2017-04-07 DIAGNOSIS — Z8249 Family history of ischemic heart disease and other diseases of the circulatory system: Secondary | ICD-10-CM | POA: Insufficient documentation

## 2017-04-07 DIAGNOSIS — I5023 Acute on chronic systolic (congestive) heart failure: Secondary | ICD-10-CM | POA: Insufficient documentation

## 2017-04-07 DIAGNOSIS — Z888 Allergy status to other drugs, medicaments and biological substances status: Secondary | ICD-10-CM | POA: Insufficient documentation

## 2017-04-07 DIAGNOSIS — Z841 Family history of disorders of kidney and ureter: Secondary | ICD-10-CM | POA: Insufficient documentation

## 2017-04-07 DIAGNOSIS — R55 Syncope and collapse: Secondary | ICD-10-CM | POA: Insufficient documentation

## 2017-04-07 DIAGNOSIS — I110 Hypertensive heart disease with heart failure: Principal | ICD-10-CM | POA: Insufficient documentation

## 2017-04-07 DIAGNOSIS — Z23 Encounter for immunization: Secondary | ICD-10-CM | POA: Insufficient documentation

## 2017-04-07 DIAGNOSIS — F17210 Nicotine dependence, cigarettes, uncomplicated: Secondary | ICD-10-CM | POA: Insufficient documentation

## 2017-04-07 DIAGNOSIS — R590 Localized enlarged lymph nodes: Secondary | ICD-10-CM | POA: Insufficient documentation

## 2017-04-07 DIAGNOSIS — I509 Heart failure, unspecified: Secondary | ICD-10-CM

## 2017-04-07 DIAGNOSIS — N179 Acute kidney failure, unspecified: Secondary | ICD-10-CM | POA: Insufficient documentation

## 2017-04-07 DIAGNOSIS — E876 Hypokalemia: Secondary | ICD-10-CM | POA: Insufficient documentation

## 2017-04-07 DIAGNOSIS — Z7951 Long term (current) use of inhaled steroids: Secondary | ICD-10-CM | POA: Insufficient documentation

## 2017-04-07 DIAGNOSIS — Z7982 Long term (current) use of aspirin: Secondary | ICD-10-CM | POA: Insufficient documentation

## 2017-04-07 DIAGNOSIS — I428 Other cardiomyopathies: Secondary | ICD-10-CM | POA: Insufficient documentation

## 2017-04-07 DIAGNOSIS — J811 Chronic pulmonary edema: Secondary | ICD-10-CM | POA: Insufficient documentation

## 2017-04-07 LAB — I-STAT ARTERIAL BLOOD GAS, ED
Acid-Base Excess: 1 mmol/L (ref 0.0–2.0)
Bicarbonate: 27.9 mmol/L (ref 20.0–28.0)
O2 Saturation: 100 %
Patient temperature: 98
TCO2: 30 mmol/L (ref 22–32)
pCO2 arterial: 52.1 mmHg — ABNORMAL HIGH (ref 32.0–48.0)
pH, Arterial: 7.336 — ABNORMAL LOW (ref 7.350–7.450)
pO2, Arterial: 382 mmHg — ABNORMAL HIGH (ref 83.0–108.0)

## 2017-04-07 LAB — CBG MONITORING, ED: Glucose-Capillary: 221 mg/dL — ABNORMAL HIGH (ref 65–99)

## 2017-04-07 LAB — COMPREHENSIVE METABOLIC PANEL
ALT: 35 U/L (ref 17–63)
AST: 57 U/L — ABNORMAL HIGH (ref 15–41)
Albumin: 3.6 g/dL (ref 3.5–5.0)
Alkaline Phosphatase: 101 U/L (ref 38–126)
Anion gap: 13 (ref 5–15)
BUN: 12 mg/dL (ref 6–20)
CO2: 23 mmol/L (ref 22–32)
Calcium: 9 mg/dL (ref 8.9–10.3)
Chloride: 104 mmol/L (ref 101–111)
Creatinine, Ser: 1.65 mg/dL — ABNORMAL HIGH (ref 0.61–1.24)
GFR calc Af Amer: 49 mL/min — ABNORMAL LOW (ref >60)
GFR calc non Af Amer: 43 mL/min — ABNORMAL LOW (ref >60)
Glucose, Bld: 266 mg/dL — ABNORMAL HIGH (ref 65–99)
Potassium: 3.6 mmol/L (ref 3.5–5.1)
Sodium: 140 mmol/L (ref 135–145)
Total Bilirubin: 0.5 mg/dL (ref 0.3–1.2)
Total Protein: 6.3 g/dL — ABNORMAL LOW (ref 6.5–8.1)

## 2017-04-07 LAB — CBC
HCT: 40.5 % (ref 39.0–52.0)
Hemoglobin: 12.3 g/dL — ABNORMAL LOW (ref 13.0–17.0)
MCH: 27.6 pg (ref 26.0–34.0)
MCHC: 30.4 g/dL (ref 30.0–36.0)
MCV: 91 fL (ref 78.0–100.0)
Platelets: 273 10*3/uL (ref 150–400)
RBC: 4.45 MIL/uL (ref 4.22–5.81)
RDW: 15.2 % (ref 11.5–15.5)
WBC: 8.1 10*3/uL (ref 4.0–10.5)

## 2017-04-07 LAB — I-STAT TROPONIN, ED: Troponin i, poc: 0.02 ng/mL (ref 0.00–0.08)

## 2017-04-07 LAB — BRAIN NATRIURETIC PEPTIDE: B Natriuretic Peptide: 3357.5 pg/mL — ABNORMAL HIGH (ref 0.0–100.0)

## 2017-04-07 MED ORDER — NITROGLYCERIN IN D5W 200-5 MCG/ML-% IV SOLN
0.0000 ug/min | Freq: Once | INTRAVENOUS | Status: AC
  Administered 2017-04-07: 200 ug/min via INTRAVENOUS

## 2017-04-07 MED ORDER — FUROSEMIDE 10 MG/ML IJ SOLN
40.0000 mg | Freq: Once | INTRAMUSCULAR | Status: AC
  Administered 2017-04-08: 40 mg via INTRAVENOUS
  Filled 2017-04-07: qty 4

## 2017-04-07 MED ORDER — POTASSIUM CHLORIDE 10 MEQ/100ML IV SOLN
10.0000 meq | INTRAVENOUS | Status: AC
  Administered 2017-04-08 (×3): 10 meq via INTRAVENOUS
  Filled 2017-04-07 (×3): qty 100

## 2017-04-07 NOTE — ED Triage Notes (Signed)
Pt from home, was outside smoking and suddenly felt dizzy, diaphoretic upon EMS arrival, became stiff and was unresponsive. Initially 50% on room air, with wheezing, non rebreather applied. Bp 176/136 HR 114 CBG 166. Pt alert upon arrival to ED. Hx of a fib, COPD, HTN.

## 2017-04-07 NOTE — ED Provider Notes (Signed)
[facility] DEPT Provider Note   CSN: [id number] Arrival date & time: 04/07/17  2231     History   Chief Complaint Chief Complaint  Patient presents with  . Altered Mental Status  . Respiratory Distress    HPI Devon Vega is a 61 y.o. male.  61 year old male history of nonischemic cardiomyopathy, CHF (EF 30-35% in 02/2017), tobacco abuse, HTN, and DDD who presents via EMS for shortness of breath and syncope.  Patient unable to provide history due to severe respiratory distress.  Diffuse crackles noted on exam.  Reportedly patient went out to smoke a cigarette before becoming acutely dyspneic and syncopized.  The patient arrives mildly confused and altered likely from hypoxia. Respiratory status significantly improved with BiPAP  The history is provided by the EMS personnel and medical records. No language interpreter was used.    Past Medical History:  Diagnosis Date  . Alcohol abuse    hx of , sober since 1997  . COPD (chronic obstructive pulmonary disease) ([place])   . Hyperlipidemia   . Hypertension   . 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 Sinus arrhythmia 05/24/2014  . Tobacco abuse    failed zyban  . Weight gain     Patient Active Problem List   Diagnosis Date Noted  . Heart failure ([place]) 04/08/2017  . Alcohol abuse 03/18/2017  . Chronic cough 03/10/2017  . CHF (congestive heart failure) ([place]) 03/02/2017  . NICM (nonischemic cardiomyopathy) ([place]) 03/02/2017  . HFrEF (heart failure with reduced ejection fraction) ([place]) 03/01/2017  . AKI (acute kidney injury) ([place]) 02/27/2017  . Long term prescription opiate use 06/18/2016  . LVH (left ventricular hypertrophy) 09/05/2015  . Sinus arrhythmia 05/24/2014  . Preventive measure 07/30/2013  . 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 Degenerative disc disease, cervical 10/02/2010  . Hyperlipidemia 07/27/2006  . TOBACCO ABUSE 07/27/2006  . Uncontrolled stage 2 hypertension 07/27/2006     Past Surgical History:  Procedure Laterality Date  . COLONOSCOPY    . EXCISION METACARPAL MASS Right 02/03/2013   Procedure: WIDE RESECTION OF GRANULAR CELL TUMOR;  Surgeon: Cammie Sickle., MD;  Location: [facility];  Service: Orthopedics;  Laterality: Right;  . LYMPH NODE BIOPSY  2011   lt axilla-negative  . MASS EXCISION Right 01/21/2013   Procedure: BIOPSY OF MASS RIGHT RING FINGER;  Surgeon: Cammie Sickle., MD;  Location: [facility];  Service: Orthopedics;  Laterality: Right;  . RIGHT/LEFT HEART CATH AND CORONARY ANGIOGRAPHY N/A 03/02/2017   Procedure: RIGHT/LEFT HEART CATH AND CORONARY ANGIOGRAPHY;  Surgeon: Jolaine Artist, MD;  Location: [facility];  Service: Cardiovascular;  Laterality: N/A;  . SKIN FULL THICKNESS GRAFT Right 02/03/2013   Procedure: SKIN GRAFT FULL THICKNESS RIGHT RING FINGER;  Surgeon: Cammie Sickle., MD;  Location: [facility];  Service: Orthopedics;  Laterality: Right;       Home Medications    Prior to Admission medications   Medication Sig Start Date End Date Taking? Authorizing Provider  aspirin EC 81 MG tablet Take 81 mg by mouth daily.    [provider]  carvedilol (COREG) 25 MG tablet Take 12.5 mg by mouth 2 (two) times daily with a meal.    [provider]  fluticasone (FLONASE) 50 MCG/ACT nasal spray Place 1 spray into both nostrils daily as needed for allergies or rhinitis.    [provider]  ibuprofen (ADVIL,MOTRIN) 200  MG tablet Take 400 mg by mouth every 6 (six) hours as needed for mild pain.    [provider]  rosuvastatin (CRESTOR) 10 MG tablet Take 1 tablet (10 mg total) by mouth daily. 06/25/16   Burns, Arloa Koh, MD  sacubitril-valsartan (ENTRESTO) 49-51 MG Take 1 tablet by mouth 2 (two) times daily. 03/18/17   Shirley Friar, PA-C  spironolactone (ALDACTONE) 25 MG tablet Take 1 tablet (25 mg total) by mouth daily. 03/18/17    Shirley Friar, PA-C    Family History Family History  Problem Relation Age of Onset  . Cancer Father   . Renal Disease Brother        On Dialysis  . Heart failure Brother   . Hypertension Brother   . Hypertension Mother     Social History Social History  Substance Use Topics  . Smoking status: Current Every Day Smoker    Packs/day: 0.75    Types: Cigarettes, Cigars  . Smokeless tobacco: Never Used     Comment: pt knows he neesd to quit  . Alcohol use 0.0 oz/week     Comment: 2 beers/day     Allergies   Codeine and Lisinopril   Review of Systems Review of Systems  Unable to perform ROS: Severe respiratory distress     Physical Exam Updated Vital Signs BP (!) 152/81 (BP Location: Right Arm)   Pulse 79   Temp 99 F (37.2 C) (Oral)   Resp (!) 25   Ht 5\' 5"  (1.651 m)   Wt 69.5 kg (153 lb 3.5 oz)   SpO2 99%   BMI 25.50 kg/m   Physical Exam  Constitutional: He appears well-developed. He appears ill. He appears distressed.  HENT:  Head: Normocephalic and atraumatic.  Eyes: Conjunctivae are normal.  Neck: Neck supple.  Cardiovascular: Normal rate and regular rhythm.   No murmur heard. Pulmonary/Chest: He is in respiratory distress. He has rales (Diffuse crackles).  Abdominal: Soft. There is no tenderness.  Musculoskeletal:  No notable BLE edema  Neurological: He is alert. No cranial nerve deficit. Coordination normal.  5/5 motor strength and intact sensation in all extremities. Intact bilateral finger-to-nose coordination  Skin: Skin is warm and dry.  Nursing note and vitals reviewed.    ED Treatments / Results  Labs (all labs ordered are listed, but only abnormal results are displayed) Labs Reviewed  COMPREHENSIVE METABOLIC PANEL - Abnormal; Notable for the following:       Result Value   Glucose, Bld 266 (*)    Creatinine, Ser 1.65 (*)    Total Protein 6.3 (*)    AST 57 (*)    GFR calc non Af Amer 43 (*)    GFR calc Af Amer 49 (*)     All other components within normal limits  CBC - Abnormal; Notable for the following:    Hemoglobin 12.3 (*)    All other components within normal limits  BRAIN NATRIURETIC PEPTIDE - Abnormal; Notable for the following:    B Natriuretic Peptide 3,357.5 (*)    All other components within normal limits  CBC WITH DIFFERENTIAL/PLATELET - Abnormal; Notable for the following:    Hemoglobin 11.7 (*)    HCT 36.9 (*)    Neutro Abs 9.1 (*)    All other components within normal limits  TROPONIN I - Abnormal; Notable for the following:    Troponin I 0.07 (*)    All other components within normal limits  CBG MONITORING, ED - Abnormal;  Notable for the following:    Glucose-Capillary 221 (*)    All other components within normal limits  I-STAT ARTERIAL BLOOD GAS, ED - Abnormal; Notable for the following:    pH, Arterial 7.336 (*)    pCO2 arterial 52.1 (*)    pO2, Arterial 382.0 (*)    All other components within normal limits  I-STAT CG4 LACTIC ACID, ED - Abnormal; Notable for the following:    Lactic Acid, Venous 2.07 (*)    All other components within normal limits  MRSA PCR SCREENING  MAGNESIUM  BASIC METABOLIC PANEL  I-STAT TROPONIN, ED    EKG  EKG Interpretation  Date/Time:  Tuesday April 07 2017 22:37:07 EDT Ventricular Rate:  110 PR Interval:    QRS Duration: 86 QT Interval:  352 QTC Calculation: 477 R Axis:   17 Text Interpretation:  Sinus tachycardia Probable left atrial enlargement RSR' in V1 or V2, right VCD or RVH Left ventricular hypertrophy Nonspecific T abnormalities, lateral leads Borderline prolonged QT interval T wave inversion RESOLVED SINCE PREVIOUS Confirmed by Blanchie Dessert [phone]) on 04/08/2017 12:33:12 AM Also confirmed by Blanchie Dessert [phone]), editor Philomena Doheny [phone])  on 04/08/2017 7:44:11 AM       Radiology Dg Chest Portable 1 View  Result Date: 04/07/2017 CLINICAL DATA:  Altered mental status.  Shortness of breath. EXAM: PORTABLE CHEST 1  VIEW COMPARISON:  Radiograph 02/27/2017 FINDINGS: Unchanged cardiomegaly. Decreased hilar prominence from prior. Worsening pulmonary edema from prior exam. No large pleural effusion. Minimal fluid in the right minor fissure. No pneumothorax. Probable bibasilar atelectasis. IMPRESSION: Increased pulmonary edema. Increased fluid in the right minor fissure. Cardiomegaly is stable. Electronically Signed   By: Jeb Levering M.D.   On: 04/07/2017 23:14    Procedures Procedures (including critical care time)  Medications Ordered in ED Medications  potassium chloride 10 mEq in 100 mL IVPB (0 mEq Intravenous Stopped 04/08/17 0522)  sodium chloride flush (NS) 0.9 % injection 3 mL (3 mLs Intravenous Given 04/08/17 0033)  sodium chloride flush (NS) 0.9 % injection 3 mL (not administered)  0.9 %  sodium chloride infusion (not administered)  acetaminophen (TYLENOL) tablet 650 mg (not administered)  ondansetron (ZOFRAN) injection 4 mg (not administered)  heparin injection 5,000 Units (not administered)  aspirin EC tablet 81 mg (not administered)  fluticasone (FLONASE) 50 MCG/ACT nasal spray 1 spray (not administered)  rosuvastatin (CRESTOR) tablet 10 mg (not administered)  nitroGLYCERIN 50 mg in dextrose 5 % 250 mL (0.2 mg/mL) infusion (200 mcg/min Intravenous New Bag/Given 04/07/17 2236)  furosemide (LASIX) injection 40 mg (40 mg Intravenous Given 04/08/17 0026)     Initial Impression / Assessment and Plan / ED Course  I have reviewed the triage vital signs and the nursing notes.  Pertinent labs & imaging results that were available during my care of the patient were reviewed by me and considered in my medical decision making (see chart for details).     51 yoM h/o CHF (EF 30-35%) who p/w respiratory distress and acute dyspnea. Found to have likely flash pulmonary edema. Hypoxic to 80% on NRB. Improved on Bipap. Diffuse crackles on lung auscultation. Nitro gtt started  BNP elevated to 3350. CXR  showing increased pulmonary edema and unchanged cardiomegaly.   Suspect CHF exacerbation. Syncopal episode likely related to hypoxia. Significant improvement on Bipap. Lasix ordered.  Pt admitted to Cardiology for management and evaluation. Pt stable at time of transfer.  Pt care d/w Dr. Maryan Rued  Final Clinical Impressions(s) / ED  Diagnoses   Final diagnoses:  Acute on chronic systolic congestive heart failure ([place])  Shortness of breath    New Prescriptions Current Discharge Medication List       Payton Emerald, MD 04/08/17 1051    Blanchie Dessert, MD 04/08/17 1416

## 2017-04-07 NOTE — ED Notes (Signed)
Pt placed on Bipap

## 2017-04-08 ENCOUNTER — Inpatient Hospital Stay (HOSPITAL_COMMUNITY): Payer: Self-pay

## 2017-04-08 ENCOUNTER — Other Ambulatory Visit (HOSPITAL_COMMUNITY): Payer: Self-pay | Admitting: Pharmacist

## 2017-04-08 ENCOUNTER — Encounter (HOSPITAL_COMMUNITY): Payer: Self-pay | Admitting: *Deleted

## 2017-04-08 DIAGNOSIS — I5023 Acute on chronic systolic (congestive) heart failure: Secondary | ICD-10-CM

## 2017-04-08 DIAGNOSIS — I10 Essential (primary) hypertension: Secondary | ICD-10-CM

## 2017-04-08 DIAGNOSIS — R55 Syncope and collapse: Secondary | ICD-10-CM

## 2017-04-08 DIAGNOSIS — I5020 Unspecified systolic (congestive) heart failure: Secondary | ICD-10-CM

## 2017-04-08 DIAGNOSIS — I361 Nonrheumatic tricuspid (valve) insufficiency: Secondary | ICD-10-CM

## 2017-04-08 DIAGNOSIS — I509 Heart failure, unspecified: Secondary | ICD-10-CM

## 2017-04-08 DIAGNOSIS — I428 Other cardiomyopathies: Secondary | ICD-10-CM

## 2017-04-08 LAB — BASIC METABOLIC PANEL
Anion gap: 6 (ref 5–15)
BUN: 11 mg/dL (ref 6–20)
CO2: 27 mmol/L (ref 22–32)
Calcium: 9 mg/dL (ref 8.9–10.3)
Chloride: 107 mmol/L (ref 101–111)
Creatinine, Ser: 1.19 mg/dL (ref 0.61–1.24)
GFR calc Af Amer: >60 mL/min (ref >60)
GFR calc non Af Amer: >60 mL/min (ref >60)
Glucose, Bld: 89 mg/dL (ref 65–99)
Potassium: 3.7 mmol/L (ref 3.5–5.1)
Sodium: 140 mmol/L (ref 135–145)

## 2017-04-08 LAB — CBC WITH DIFFERENTIAL/PLATELET
Basophils Absolute: 0 10*3/uL (ref 0.0–0.1)
Basophils Relative: 0 %
Eosinophils Absolute: 0 10*3/uL (ref 0.0–0.7)
Eosinophils Relative: 0 %
HCT: 36.9 % — ABNORMAL LOW (ref 39.0–52.0)
Hemoglobin: 11.7 g/dL — ABNORMAL LOW (ref 13.0–17.0)
Lymphocytes Relative: 8 %
Lymphs Abs: 0.9 10*3/uL (ref 0.7–4.0)
MCH: 27.4 pg (ref 26.0–34.0)
MCHC: 31.7 g/dL (ref 30.0–36.0)
MCV: 86.4 fL (ref 78.0–100.0)
Monocytes Absolute: 0.6 10*3/uL (ref 0.1–1.0)
Monocytes Relative: 5 %
Neutro Abs: 9.1 10*3/uL — ABNORMAL HIGH (ref 1.7–7.7)
Neutrophils Relative %: 87 %
Platelets: 234 10*3/uL (ref 150–400)
RBC: 4.27 MIL/uL (ref 4.22–5.81)
RDW: 15.3 % (ref 11.5–15.5)
WBC: 10.5 10*3/uL (ref 4.0–10.5)

## 2017-04-08 LAB — MAGNESIUM: Magnesium: 2.1 mg/dL (ref 1.7–2.4)

## 2017-04-08 LAB — ECHOCARDIOGRAM COMPLETE
Height: 65 in
Weight: 2451.52 oz

## 2017-04-08 LAB — MRSA PCR SCREENING: MRSA by PCR: NEGATIVE

## 2017-04-08 LAB — I-STAT CG4 LACTIC ACID, ED: Lactic Acid, Venous: 2.07 mmol/L (ref 0.5–1.9)

## 2017-04-08 LAB — TROPONIN I: Troponin I: 0.07 ng/mL (ref <0.03)

## 2017-04-08 MED ORDER — ASPIRIN EC 81 MG PO TBEC
81.0000 mg | DELAYED_RELEASE_TABLET | Freq: Every day | ORAL | Status: DC
  Administered 2017-04-08: 81 mg via ORAL
  Filled 2017-04-08: qty 1

## 2017-04-08 MED ORDER — ROSUVASTATIN CALCIUM 10 MG PO TABS
10.0000 mg | ORAL_TABLET | Freq: Every day | ORAL | Status: DC
  Administered 2017-04-08: 10 mg via ORAL
  Filled 2017-04-08 (×2): qty 1

## 2017-04-08 MED ORDER — INFLUENZA VAC SPLIT QUAD 0.5 ML IM SUSY
0.5000 mL | PREFILLED_SYRINGE | INTRAMUSCULAR | Status: AC
  Administered 2017-04-08: 0.5 mL via INTRAMUSCULAR

## 2017-04-08 MED ORDER — FLUTICASONE PROPIONATE 50 MCG/ACT NA SUSP: 1.0000 | Freq: Every day | NASAL | Status: DC | PRN

## 2017-04-08 MED ORDER — SPIRONOLACTONE 25 MG PO TABS: 25.0000 mg | ORAL_TABLET | Freq: Every day | ORAL | Status: DC

## 2017-04-08 MED ORDER — ACETAMINOPHEN 325 MG PO TABS
650.0000 mg | ORAL_TABLET | ORAL | Status: DC | PRN
  Filled 2017-04-08: qty 2

## 2017-04-08 MED ORDER — SODIUM CHLORIDE 0.9 % IV SOLN: 250.0000 mL | INTRAVENOUS | Status: DC | PRN

## 2017-04-08 MED ORDER — ONDANSETRON HCL 4 MG/2ML IJ SOLN: 4.0000 mg | Freq: Four times a day (QID) | INTRAMUSCULAR | Status: DC | PRN

## 2017-04-08 MED ORDER — SACUBITRIL-VALSARTAN 97-103 MG PO TABS: 1.0000 | ORAL_TABLET | Freq: Two times a day (BID) | ORAL | 3 refills | Status: DC

## 2017-04-08 MED ORDER — SODIUM CHLORIDE 0.9% FLUSH: 3.0000 mL | INTRAVENOUS | Status: DC | PRN

## 2017-04-08 MED ORDER — HEPARIN SODIUM (PORCINE) 5000 UNIT/ML IJ SOLN
5000.0000 [IU] | Freq: Three times a day (TID) | INTRAMUSCULAR | Status: DC
  Filled 2017-04-08: qty 1

## 2017-04-08 MED ORDER — SACUBITRIL-VALSARTAN 97-103 MG PO TABS: 1.0000 | ORAL_TABLET | Freq: Two times a day (BID) | ORAL | 5 refills | Status: DC

## 2017-04-08 MED ORDER — CARVEDILOL 3.125 MG PO TABS
3.1250 mg | ORAL_TABLET | Freq: Two times a day (BID) | ORAL | Status: DC
  Administered 2017-04-08: 3.125 mg via ORAL
  Filled 2017-04-08: qty 1

## 2017-04-08 MED ORDER — SPIRONOLACTONE 25 MG PO TABS: 12.5000 mg | ORAL_TABLET | Freq: Every day | ORAL | Status: DC

## 2017-04-08 MED ORDER — SODIUM CHLORIDE 0.9% FLUSH
3.0000 mL | Freq: Two times a day (BID) | INTRAVENOUS | Status: DC
  Administered 2017-04-08: 3 mL via INTRAVENOUS

## 2017-04-08 NOTE — H&P (Signed)
Cardiology History & Physical    Patient ID: Devon Vega MRN: [id number], DOB: February 14, 1956 Date of Encounter: 04/08/2017, 1:49 AM Primary Physician: Kathi Ludwig, MD  Chief Complaint: SOB   HPI: Devon Vega is a 61 y.o. male with recent diagnosis of NICMP (EF 35-40%, initial HF hospitalization in 02/2017), COPD, HTN, who presents with SOB.  Pt had been feeling well until 1-2 days PTA, when he noted the onset of SOB.  He denied any associated CP, PND, orthopnea, LE edema, weight changes, fevers/chills.  Pt had progressive SOB over the day today, and had gone out to smoke a cigarette this evening when he became acutely SOB and diaphoretic, and ultimately syncopized.  He does not clearly recall this.  He was significantly dyspneic and hypoxemic upon initial evaluation.  In the ED, he was tachypneic and hypertensive, with CXR concerning for pulmonary edema.  Labs showed mild AKI (Cr 1.65), normal initial troponin.  ECG showed nonspecific STTW changes, but no e/o acute ischemia.  BiPAP and nitro gtt were started with good effect.  Past Medical History:  Diagnosis Date  . Alcohol abuse    hx of , sober since 1997  . COPD (chronic obstructive pulmonary disease) ([place])   . Hyperlipidemia   . Hypertension   . 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 Sinus arrhythmia 05/24/2014  . Tobacco abuse    failed zyban  . Weight gain      Surgical History:  Past Surgical History:  Procedure Laterality Date  . COLONOSCOPY    . EXCISION METACARPAL MASS Right 02/03/2013   Procedure: WIDE RESECTION OF GRANULAR CELL TUMOR;  Surgeon: Cammie Sickle., MD;  Location: [facility];  Service: Orthopedics;  Laterality: Right;  . LYMPH NODE BIOPSY  2011   lt axilla-negative  . MASS EXCISION Right 01/21/2013   Procedure: BIOPSY OF MASS RIGHT RING FINGER;  Surgeon: Cammie Sickle., MD;  Location: [facility];  Service: Orthopedics;  Laterality: Right;  .  RIGHT/LEFT HEART CATH AND CORONARY ANGIOGRAPHY N/A 03/02/2017   Procedure: RIGHT/LEFT HEART CATH AND CORONARY ANGIOGRAPHY;  Surgeon: Jolaine Artist, MD;  Location: [facility];  Service: Cardiovascular;  Laterality: N/A;  . SKIN FULL THICKNESS GRAFT Right 02/03/2013   Procedure: SKIN GRAFT FULL THICKNESS RIGHT RING FINGER;  Surgeon: Cammie Sickle., MD;  Location: [facility];  Service: Orthopedics;  Laterality: Right;     Home Meds: Prior to Admission medications   Medication Sig Start Date End Date Taking? Authorizing Provider  aspirin EC 81 MG tablet Take 81 mg by mouth daily.    [provider]  carvedilol (COREG) 25 MG tablet Take 12.5 mg by mouth 2 (two) times daily with a meal.    [provider]  fluticasone (FLONASE) 50 MCG/ACT nasal spray Place 1 spray into both nostrils daily as needed for allergies or rhinitis.    [provider]  ibuprofen (ADVIL,MOTRIN) 200 MG tablet Take 400 mg by mouth every 6 (six) hours as needed for mild pain.    [provider]  rosuvastatin (CRESTOR) 10 MG tablet Take 1 tablet (10 mg total) by mouth daily. 06/25/16   Burns, Arloa Koh, MD  sacubitril-valsartan (ENTRESTO) 49-51 MG Take 1 tablet by mouth 2 (two) times daily. 03/18/17   Shirley Friar, PA-C  spironolactone (ALDACTONE) 25 MG tablet Take 1 tablet (25 mg total) by mouth daily. 03/18/17  Shirley Friar, PA-C    Allergies:  Allergies  Allergen Reactions  . Codeine Cough  . Lisinopril     REACTION: cough    Social History   Social History  . Marital status: Divorced    Spouse name: N/A  . Number of children: N/A  . Years of education: N/A   Occupational History  . Not on file.   Social History Main Topics  . Smoking status: Current Every Day Smoker    Packs/day: 0.75    Types: Cigarettes, Cigars  . Smokeless tobacco: Never Used     Comment: pt knows he neesd to quit  . Alcohol use 0.0 oz/week      Comment: 2 beers/day  . Drug use: Yes    Types: Marijuana  . Sexual activity: Not on file   Other Topics Concern  . Not on file   Social History Narrative   Works for [employer]. 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 Cancer Father   . Renal Disease Brother        On Dialysis  . Heart failure Brother   . Hypertension Brother   . Hypertension Mother     Review of Systems: All other systems reviewed and are otherwise negative except as noted above.  Labs:  Lab Results  Component Value Date   WBC 8.1 04/07/2017   HGB 12.3 (L) 04/07/2017   HCT 40.5 04/07/2017   MCV 91.0 04/07/2017   PLT 273 04/07/2017    Recent Labs Lab 04/07/17 2235  NA 140  K 3.6  CL 104  CO2 23  BUN 12  CREATININE 1.65*  CALCIUM 9.0  PROT 6.3*  BILITOT 0.5  ALKPHOS 101  ALT 35  AST 57*  GLUCOSE 266*   No results for input(s): CKTOTAL, CKMB, TROPONINI in the last 72 hours. Lab Results  Component Value Date   CHOL 183 03/01/2017   HDL 45 03/01/2017   LDLCALC 99 03/01/2017   TRIG 194 (H) 03/01/2017   Lab Results  Component Value Date   DDIMER 0.63 (H) 02/27/2017    Radiology/Studies:  Dg Chest Portable 1 View  Result Date: 04/07/2017 CLINICAL DATA:  Altered mental status.  Shortness of breath. EXAM: PORTABLE CHEST 1 VIEW COMPARISON:  Radiograph 02/27/2017 FINDINGS: Unchanged cardiomegaly. Decreased hilar prominence from prior. Worsening pulmonary edema from prior exam. No large pleural effusion. Minimal fluid in the right minor fissure. No pneumothorax. Probable bibasilar atelectasis. IMPRESSION: Increased pulmonary edema. Increased fluid in the right minor fissure. Cardiomegaly is stable. Electronically Signed   By:  Jeb Levering M.D.   On: 04/07/2017 23:14   Wt Readings from Last 3 Encounters:  03/18/17 71.8 kg (158 lb 6.4 oz)  03/10/17 72.7 kg (160 lb 3.2 oz)  03/02/17 70.4 kg (155 lb 3.2 oz)    EKG: ST, nonspecific STTW changes.  Physical Exam: Blood pressure (!) 146/89, pulse 87, temperature (!) 97.3 F (36.3 C), temperature source Temporal, resp. rate (!) 25, SpO2 98 %. There is no height or weight on file to calculate BMI. General: Normal WOB with BiPAP in place. Head: Normocephalic, atraumatic, sclera non-icteric, no xanthomas, nares  are without discharge.  Neck: Negative for carotid bruits. JVD not elevated. Lungs: Bibasilar crackles Heart: RRR with S1 S2. No murmurs, rubs, or gallops appreciated. Abdomen: Soft, non-tender, non-distended with normoactive bowel sounds. No hepatomegaly. No rebound/guarding. No obvious abdominal masses. Msk:  Strength and tone appear normal for age. Extremities: No clubbing or cyanosis. No edema.  Distal pedal pulses are 2+ and equal bilaterally. Neuro: Alert and oriented X 3. No focal deficit. No facial asymmetry. Moves all extremities spontaneously. Psych:  Responds to questions appropriately with a normal affect.    Assessment and Plan  60 y.o. male with recent diagnosis of NICMP (EF 35-40%, initial HF hospitalization in 02/2017), COPD, HTN, who was found to have flash pulmonary edema and a syncopal episode.  1.  Acute on chronic systolic heart failure:  Diurese with IV Lasix, continue nitro gtt.  Holding off on beta blocker overnight given decompensation.  Holding Entresto given mild AKI.  2. Syncope: Unclear etiology.  Will cycle CBMs and monitor on telemetry.  3.  AKI:  Either 2/2 volume versus hypertensive urgency, will monitor with diuresis.  4.  HTN: Holding HF meds as above, managing BP with nitro gtt overnight.  Will plan to titrate off nitro gtt and institute home oral agents pending AM labs.  Signed, Doylene Canning, MD 04/08/2017,  1:49 AM

## 2017-04-08 NOTE — Progress Notes (Signed)
Advanced Heart Failure Rounding Note  PCP: Dr. Kathi Ludwig Primary Cardiologist: Dr. Haroldine Laws   Subjective:    Breathing much improved. Denies lightheadedness or dizziness. He denies tachypalpitations. He initially tells me he lost consciousness in "the yard" and doesn't remember getting to the hospital. He then states he is not sure if he passed out after being told he can't drive for 6 months. Denies chest pain.   Review of EMS record shows that patient was already attached to monitor when he "passed out" and no arrhythmias noted.   Negative 1 L with 1 dose IV lasix 40 mg.   Mg 2.1. BMET defaulted to 04/09/17 since ordered after midnight. Will send now.   Objective:   Weight Range: 153 lb 3.5 oz (69.5 kg) Body mass index is 25.5 kg/m.   Vital Signs:   Temp:  [97.3 F (36.3 C)-99 F (37.2 C)] 99 F (37.2 C) (09/26 0634) Pulse Rate:  [75-111] 79 (09/26 0634) Resp:  [16-37] 25 (09/26 0600) BP: (131-198)/(72-116) 152/81 (09/26 0634) SpO2:  [92 %-100 %] 99 % (09/26 0634) FiO2 (%):  [100 %] 100 % (09/25 2226) Weight:  [153 lb 3.5 oz (69.5 kg)] 153 lb 3.5 oz (69.5 kg) (09/26 0634) Last BM Date: 04/07/17  Weight change: Filed Weights   04/08/17 0634  Weight: 153 lb 3.5 oz (69.5 kg)    Intake/Output:   Intake/Output Summary (Last 24 hours) at 04/08/17 0845 Last data filed at 04/08/17 0521  Gross per 24 hour  Intake              200 ml  Output             1200 ml  Net            -1000 ml      Physical Exam    General:  Well appearing. No resp difficulty HEENT: Normal Neck: Supple. JVP 7-8 cm. Carotids 2+ bilat; no bruits. No lymphadenopathy or thyromegaly appreciated. Cor: PMI nondisplaced. Regular rate & rhythm. No rubs, gallops or murmurs. Lungs: Clear Abdomen: Soft, nontender, nondistended. No hepatosplenomegaly. No bruits or masses. Good bowel sounds. Extremities: No cyanosis, clubbing, rash, or edema Neuro: Alert & orientedx3, cranial nerves grossly  intact. moves all 4 extremities w/o difficulty. Affect pleasant   Telemetry   NSR, personally reviewed.  EKG    Sinus tach 110 on admit, personally reviewed  Labs    CBC  Recent Labs  04/07/17 2235 04/08/17 0456  WBC 8.1 10.5  NEUTROABS  --  9.1*  HGB 12.3* 11.7*  HCT 40.5 36.9*  MCV 91.0 86.4  PLT 273 378   Basic Metabolic Panel  Recent Labs  04/07/17 2235 04/08/17 0456  NA 140  --   K 3.6  --   CL 104  --   CO2 23  --   GLUCOSE 266*  --   BUN 12  --   CREATININE 1.65*  --   CALCIUM 9.0  --   MG  --  2.1   Liver Function Tests  Recent Labs  04/07/17 2235  AST 57*  ALT 35  ALKPHOS 101  BILITOT 0.5  PROT 6.3*  ALBUMIN 3.6   No results for input(s): LIPASE, AMYLASE in the last 72 hours. Cardiac Enzymes  Recent Labs  04/08/17 0456  TROPONINI 0.07*    BNP: BNP (last 3 results)  Recent Labs  02/27/17 0335 04/07/17 2235  BNP 1,066.9* 3,357.5*    ProBNP (last 3 results) No  results for input(s): PROBNP in the last 8760 hours.   D-Dimer No results for input(s): DDIMER in the last 72 hours. Hemoglobin A1C No results for input(s): HGBA1C in the last 72 hours. Fasting Lipid Panel No results for input(s): CHOL, HDL, LDLCALC, TRIG, CHOLHDL, LDLDIRECT in the last 72 hours. Thyroid Function Tests No results for input(s): TSH, T4TOTAL, T3FREE, THYROIDAB in the last 72 hours.  Invalid input(s): FREET3  Other results:   Imaging    Dg Chest Portable 1 View  Result Date: 04/07/2017 CLINICAL DATA:  Altered mental status.  Shortness of breath. EXAM: PORTABLE CHEST 1 VIEW COMPARISON:  Radiograph 02/27/2017 FINDINGS: Unchanged cardiomegaly. Decreased hilar prominence from prior. Worsening pulmonary edema from prior exam. No large pleural effusion. Minimal fluid in the right minor fissure. No pneumothorax. Probable bibasilar atelectasis. IMPRESSION: Increased pulmonary edema. Increased fluid in the right minor fissure. Cardiomegaly is stable.  Electronically Signed   By: Jeb Levering M.D.   On: 04/07/2017 23:14      Medications:     Scheduled Medications: . aspirin EC  81 mg Oral Daily  . heparin  5,000 Units Subcutaneous Q8H  . rosuvastatin  10 mg Oral Daily  . sodium chloride flush  3 mL Intravenous Q12H     Infusions: . sodium chloride       PRN Medications:  sodium chloride, acetaminophen, fluticasone, ondansetron (ZOFRAN) IV, sodium chloride flush    Patient Profile   61 y.o. male with recent diagnosis of NICMP (EF 35-40%, initial HF hospitalization in 02/2017), COPD, HTN, who was found to have flash pulmonary edema and a syncopal episode.  Assessment/Plan   1. Acute on chronic systolic CHF - Volume status does not look elevated on exam. No further lasix for now. Await BMET. - Resume coreg 3.125 mg BID. Increase back to home dose as tolerated.  - Hold entresto for now with AKI. If improved on BMET this am, may be able to resume.   - Resume spiro 12.5 mg qhs. (BMET pending) 2. Syncope - HPI initially concerning for arrythmia with acute SOB and diaphoresis. However, EMS had arrived at this point and pt was on the monitor with no arrhythmia noted.  - Will repeat stat Echo and have EP see for ICD consideration.  - Informed patient he should not drive for 6 months according to [facility] law.  3. AKI - Continue to follow with diuresis - Baseline creatinine 1.0 - 1.2. Up to 1.6 today.  4. HTN urgency - Systolic BPs in upper 419F on admit, diastolic in 790W. - Started on nitro gtt on admit with HF meds held.  - Nitro now off. Meds as above.  5. Acute respiratory failure - CXR 04/07/17 with pulmonary edema.  - Initially required BiPAP, now stable on O2 via [place].  - Repeat CXR in am.  6. Hypokalemia - Mild at 3.6 on admit. Got 5 runs of 10 meq K. BMET this am pending.   Length of Stay: 0  Annamaria Helling  04/08/2017, 8:45 AM  Advanced Heart Failure Team Pager [phone] (M-F; 7a - 4p)  Please  contact [facility] for night-coverage after hours (4p -7a ) and weekends on [email or web address]  Patient seen and examined with the above-signed Advanced Practice Provider and/or Housestaff. I personally reviewed laboratory data, imaging studies and relevant notes. I independently examined the patient and formulated the important aspects of the plan. I have edited the note to reflect any of my changes or salient points. I have personally  discussed the plan with the patient and/or family.  Volume status improving. Will repeat echo and have EP see to decide on need for ICD. It is not clear if this was real syncopal episode or not. Respiratory status improved with diuresis.   Glori Bickers, MD  1:15 PM

## 2017-04-08 NOTE — Progress Notes (Signed)
*  PRELIMINARY RESULTS* Echocardiogram 2D Echocardiogram has been performed.  Devon Vega 04/08/2017, 10:30 AM

## 2017-04-08 NOTE — Discharge Summary (Signed)
Advanced Heart Failure Discharge Note  Discharge Summary   Patient ID: Devon Vega MRN: [id number], DOB/AGE: 10/31/1955 61 y.o. Admit date: 04/07/2017 D/C date:     04/08/2017   Primary Discharge Diagnoses:  1. Acute on chronic systolic CHF 2. Syncope 3. AKI 4. HTN urgency 5. Acute respiratory failure 6. Hypokalemia  Hospital Course:   Devon Vega is a 61 y.o. male 61 y.o. malewith recent diagnosis of NICM (EF 30-35%, initial HF hospitalization in 02/2017 cath with normal cors), COPD, HTN, who was found to have flash pulmonary edema, hypertensive urgency, and a syncopal episode.   Presentation initially concerning for arrhythmia with h/o systolic HF, but per EMS narrative, pt was attached to Monitor at time of syncope, and no arrhythmia noted.  CXR with pulmonary edema. Treated with one dose 40 mg IV lasix with symptomatic improvement. Initially started on NTG gtt with HTN, but weaned off as tolerated.   EP consulted with ? Arrhythmia. They confirmed EMS timeline and have low suspicion of arryhtmia. They recommend continued medical therapy and repeat Echo in 3 months for ICD consideration.   Pt BP and mild AKI on admit continued to improve with dose of IV lasix and med adjustment.  Echo 04/08/17 with LVEF 35-40% (previously 30-35%).  Examined am of 04/08/17 and thought stable for discharge. Pt will continue medical therapy/GDMT for 3 months prior to repeat echo and possible ICD consideration. He will have close follow up in HF clinic as below.   Discharge Weight Range: 153 lb Discharge Vitals: Blood pressure (!) 153/82, pulse 65, temperature 98.9 F (37.2 C), temperature source Oral, resp. rate 19, height 5\' 5"  (1.651 m), weight 153 lb 3.5 oz (69.5 kg), SpO2 100 %.  Labs: Lab Results  Component Value Date   WBC 10.5 04/08/2017   HGB 11.7 (L) 04/08/2017   HCT 36.9 (L) 04/08/2017   MCV 86.4 04/08/2017   PLT 234 04/08/2017     Recent Labs Lab 04/07/17 2235  04/08/17 0921  NA 140 140  K 3.6 3.7  CL 104 107  CO2 23 27  BUN 12 11  CREATININE 1.65* 1.19  CALCIUM 9.0 9.0  PROT 6.3*  --   BILITOT 0.5  --   ALKPHOS 101  --   ALT 35  --   AST 57*  --   GLUCOSE 266* 89   Lab Results  Component Value Date   CHOL 183 03/01/2017   HDL 45 03/01/2017   LDLCALC 99 03/01/2017   TRIG 194 (H) 03/01/2017   BNP (last 3 results)  Recent Labs  02/27/17 0335 04/07/17 2235  BNP 1,066.9* 3,357.5*    ProBNP (last 3 results) No results for input(s): PROBNP in the last 8760 hours.   Diagnostic Studies/Procedures   Dg Chest Portable 1 View  Result Date: 04/07/2017 CLINICAL DATA:  Altered mental status.  Shortness of breath. EXAM: PORTABLE CHEST 1 VIEW COMPARISON:  Radiograph 02/27/2017 FINDINGS: Unchanged cardiomegaly. Decreased hilar prominence from prior. Worsening pulmonary edema from prior exam. No large pleural effusion. Minimal fluid in the right minor fissure. No pneumothorax. Probable bibasilar atelectasis. IMPRESSION: Increased pulmonary edema. Increased fluid in the right minor fissure. Cardiomegaly is stable. Electronically Signed   By: Jeb Levering M.D.   On: 04/07/2017 23:14    Discharge Medications   Allergies as of 04/08/2017      Reactions   Codeine Cough   Lisinopril Cough      Medication List    STOP taking these medications  ibuprofen 200 MG tablet Commonly known as:  ADVIL,MOTRIN   sacubitril-valsartan 49-51 MG Commonly known as:  ENTRESTO Replaced by:  sacubitril-valsartan 97-103 MG     TAKE these medications   aspirin EC 81 MG tablet Take 81 mg by mouth daily.   carvedilol 25 MG tablet Commonly known as:  COREG Take 12.5 mg by mouth 2 (two) times daily with a meal.   fluticasone 50 MCG/ACT nasal spray Commonly known as:  FLONASE Place 1 spray into both nostrils daily as needed for allergies or rhinitis.   rosuvastatin 10 MG tablet Commonly known as:  CRESTOR Take 1 tablet (10 mg total) by mouth  daily.   sacubitril-valsartan 97-103 MG Commonly known as:  ENTRESTO Take 1 tablet by mouth 2 (two) times daily. Replaces:  sacubitril-valsartan 49-51 MG   spironolactone 25 MG tablet Commonly known as:  ALDACTONE Take 1 tablet (25 mg total) by mouth daily.            Discharge Care Instructions        Start     Ordered   04/08/17 0000  sacubitril-valsartan (ENTRESTO) 97-103 MG  2 times daily    Question:  Supervising Provider  Answer:  Jolaine Artist   04/08/17 1443   04/08/17 0000  Diet - low sodium heart healthy     04/08/17 1443   04/08/17 0000  Increase activity slowly     04/08/17 1443   04/08/17 0000  STOP any activity that causes chest pain, shortness of breath, dizziness, sweating, or exessive weakness     04/08/17 1443   04/08/17 0000  (HEART FAILURE PATIENTS) Call MD:  Anytime you have any of the following symptoms: 1) 3 pound weight gain in 24 hours or 5 pounds in 1 week 2) shortness of breath, with or without a dry hacking cough 3) swelling in the hands, feet or stomach 4) if you have to sleep on extra pillows at night in order to breathe.     04/08/17 1443      Disposition   The patient will be discharged in stable condition to home. Discharge Instructions    (HEART FAILURE PATIENTS) Call MD:  Anytime you have any of the following symptoms: 1) 3 pound weight gain in 24 hours or 5 pounds in 1 week 2) shortness of breath, with or without a dry hacking cough 3) swelling in the hands, feet or stomach 4) if you have to sleep on extra pillows at night in order to breathe.    Complete by:  As directed    Diet - low sodium heart healthy    Complete by:  As directed    Increase activity slowly    Complete by:  As directed    STOP any activity that causes chest pain, shortness of breath, dizziness, sweating, or exessive weakness    Complete by:  As directed      Follow-up Information    [place] Follow up on  04/15/2017.   Specialty:  Cardiology Why:  at 1100 for post hospital follow up. The code for parking is 8000. Please bring all of your medications to your visit.  Contact information: [address] [id number] [place] [phone]            Duration of Discharge Encounter: Greater than 35 minutes   Signed, Annamaria Helling 04/08/2017, 2:58 PM    Patient seen and examined with the above-signed Advanced  Practice Provider and/or Housestaff. I personally reviewed laboratory data, imaging studies and relevant notes. I independently examined the patient and formulated the important aspects of the plan. I have edited the note to reflect any of my changes or salient points. I have personally discussed the plan with the patient and/or family.  Patient initially presented with possible syncope but no evidence or arrhythmia on witnessed event. Echo with improving EF (now 35-40% - reviewed personally). Volume status improved. EP has seen and feels episode was non-arrhythmogenic. Will increase Entresto to 97/103. [place] for d/c home today with f/u in HF clinic.   Glori Bickers, MD  3:39 PM

## 2017-04-08 NOTE — ED Provider Notes (Deleted)
I saw and evaluated the patient, reviewed the resident's note and I agree with the findings and plan.   EKG Interpretation  Date/Time:  Tuesday April 07 2017 22:37:07 EDT Ventricular Rate:  110 PR Interval:    QRS Duration: 86 QT Interval:  352 QTC Calculation: 477 R Axis:   17 Text Interpretation:  Sinus tachycardia Probable left atrial enlargement RSR' in V1 or V2, right VCD or RVH Left ventricular hypertrophy Nonspecific T abnormalities, lateral leads Borderline prolonged QT interval T wave inversion RESOLVED SINCE PREVIOUS Confirmed by Blanchie Dessert [phone]) on 04/08/2017 12:33:12 AM Also confirmed by Blanchie Dessert [phone]), editor Philomena Doheny [phone])  on 04/08/2017 7:44:11 AM      Acute on chronic systolic congestive heart failure (HCC)  Shortness of breath  Acute on chronic systolic (congestive) heart failure ([place]) - Plan: DG Chest 2 View, DG Chest 2 View    Patient is a 61 year old male with a history of alcohol and tobacco abuse, hypertension, COPD and reduced EF presenting with sudden onset of shortness of breath. Patient states he went out to smoke a cigarette when he became acutely short of breath, sweaty and had an episode of syncope. When EMS arrived patient was awake and alert but is in respiratory distress. He was placed on oxygen and given a breathing treatment with only minimal improvement. Upon arrival here patient was tachypnea, diaphoretic, tachycardic and severely hypertensive. Pressed sounds with rales bilaterally in all fields, tachypnea, diaphoresis. Heart tachycardia with no murmurs. No significant swelling in the lower extremities. No abdominal pain. Patient with findings concerning for flash pulmonary edema. EKG without evidence of MI. Patient's initial troponin within normal limits. Mildl AKI will with a creatinine of 1.65. Patient started on nitroglycerin drip and started BiPAP. After an hour patient had significant improvement in his symptoms.  He was  also given Lasix. He will need admission for further treatment.  CRITICAL CARE Performed by: Blanchie Dessert Total critical care time: 30 minutes Critical care time was exclusive of separately billable procedures and treating other patients. Critical care was necessary to treat or prevent imminent or life-threatening deterioration. Critical care was time spent personally by me on the following activities: development of treatment plan with patient and/or surrogate as well as nursing, discussions with consultants, evaluation of patient's response to treatment, examination of patient, obtaining history from patient or surrogate, ordering and performing treatments and interventions, ordering and review of laboratory studies, ordering and review of radiographic studies, pulse oximetry and re-evaluation of patient's condition.    Blanchie Dessert, MD 04/08/17 1416

## 2017-04-08 NOTE — ED Notes (Signed)
Attempted to call report

## 2017-04-08 NOTE — Consult Note (Signed)
Cardiology Consultation:   Patient ID: ATHEN RIEL; [id number]; 12/23/1955   Admit date: 04/07/2017 Date of Consult: 04/08/2017  Primary Care Provider: Kathi Ludwig, MD Primary Cardiologist: Dr. Haroldine Laws    Patient Profile:   Devon Vega is a 61 y.o. male with a hx of poorly/uncontrolled HTN, HLD, COPD, medication noncompliance, HLD, remote ETOH abuse (none since 1997), ongoing smoker admitted to [facility] last month with acute SOB, found with HTN urgency and new CM, had cath without CAD discharged 03/02/17 who is readmitted and being seen today for the evaluation of syncope at the request of Dr. Haroldine Laws.  History of Present Illness:   Devon Vega was admitted overnight coming to the hospital, was outside smoking became acutely SOB again, by the chart, started to feel poorly like he was going to faint and called 911  EMS Chart reviewed Prior to EMS arrival fire found low O2 sats 45 on RA placed on O2 and then 75 Patient apparently was found AAOx4, described as able to speak in full sentences, in no distress though diaphoretic, 1st charted vitals 198/104, RR 20, placed on monitor initial rhythm ST/SR 100-110.  WIth EMS is when patient became unresponsive, second vitals associated with this  unknown BP, HR 108bpm, resp 24, one minute later responds to voice with BP 176/136, HR 108, RR 20, described as becoming stiff/clenched fists stated he felt bad and became unresponsive reportedly already on monitor and no tracings note any rhythm other then SR/ST.  Remained somewhat confused, able to speak but unable to express any specific symptoms.  Initially in ER found mildly confused, 152/81, hypoxic 80%w/crackles and in resp distress initially treated with BIPAP and NTG gtt and IV lasix with improvement, suspected syncope 2/2 hypoxia and flash pulmonary edema.  Initial LABS K+ 3.6 Mag 2.1 BUN/Creat 12/1.65 BNP 3357 WBC 8.1 H/H 12/40/plts 273  The patient and girlfriend  confirm EMS accounts, no syncope until after EMS arrived, and he states if he fainted with them he does not recall.  NO CP, palpitations.  He denies dizzy spells.  He had been doing very well post hospital stay, reports compliance with his medicines, and "just like last time" suddenly very SOB and unable to get air.  Past Medical History:  Diagnosis Date  . Alcohol abuse    hx of , sober since 1997  . COPD (chronic obstructive pulmonary disease) ([place])   . Hyperlipidemia   . Hypertension   . 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 Sinus arrhythmia 05/24/2014  . Tobacco abuse    failed zyban  . Weight gain     Past Surgical History:  Procedure Laterality Date  . COLONOSCOPY    . EXCISION METACARPAL MASS Right 02/03/2013   Procedure: WIDE RESECTION OF GRANULAR CELL TUMOR;  Surgeon: Cammie Sickle., MD;  Location: [facility];  Service: Orthopedics;  Laterality: Right;  . LYMPH NODE BIOPSY  2011   lt axilla-negative  . MASS EXCISION Right 01/21/2013   Procedure: BIOPSY OF MASS RIGHT RING FINGER;  Surgeon: Cammie Sickle., MD;  Location: [facility];  Service: Orthopedics;  Laterality: Right;  . RIGHT/LEFT HEART CATH AND CORONARY ANGIOGRAPHY N/A 03/02/2017   Procedure: RIGHT/LEFT HEART CATH AND CORONARY ANGIOGRAPHY;  Surgeon: Jolaine Artist, MD;  Location: [facility];  Service: Cardiovascular;  Laterality: N/A;  . SKIN FULL THICKNESS GRAFT Right 02/03/2013   Procedure: SKIN GRAFT FULL THICKNESS RIGHT RING FINGER;  Surgeon: Cammie Sickle., MD;  Location: [facility];  Service: Orthopedics;  Laterality: Right;       Inpatient Medications: Scheduled Meds: . aspirin EC  81 mg Oral Daily  . carvedilol  3.125 mg Oral BID WC  . heparin  5,000 Units Subcutaneous Q8H  . rosuvastatin  10 mg Oral Daily  . sodium chloride flush  3 mL Intravenous Q12H  . spironolactone  12.5 mg Oral QHS   Continuous Infusions: . sodium  chloride     PRN Meds: sodium chloride, acetaminophen, fluticasone, ondansetron (ZOFRAN) IV, sodium chloride flush  Allergies:    Allergies  Allergen Reactions  . Codeine Cough  . Lisinopril     REACTION: cough    Social History:   Social History   Social History  . Marital status: Divorced    Spouse name: N/A  . Number of children: N/A  . Years of education: N/A   Occupational History  . Not on file.   Social History Main Topics  . Smoking status: Current Every Day Smoker    Packs/day: 0.75    Types: Cigarettes, Cigars  . Smokeless tobacco: Never Used     Comment: pt knows he neesd to quit  . Alcohol use 0.0 oz/week     Comment: 2 beers/day  . Drug use: Yes    Types: Marijuana  . Sexual activity: Not on file   Other Topics Concern  . Not on file   Social History Narrative   Works for [employer]. 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 Cancer Father   . Renal Disease Brother        On Dialysis  . Heart failure Brother   . Hypertension Brother   . Hypertension Mother      ROS:  Please see the history of present illness.  ROS  All other ROS reviewed and negative.     Physical Exam/Data:   Vitals:   04/08/17 0545 04/08/17 0600 04/08/17 0634 04/08/17 1032  BP: 136/75 138/81 (!) 152/81 (!) 145/70  Pulse: 79 75 79   Resp: (!) 21 (!) 25    Temp:   99 F (37.2 C) 99 F (37.2 C)  TempSrc:   Oral Oral  SpO2: 96% 97% 99% 94%  Weight:   153 lb 3.5 oz (69.5 kg)   Height:   5\' 5"  (1.651 m)     Intake/Output Summary (Last 24 hours) at 04/08/17 1113 Last data filed at 04/08/17 0521  Gross per 24 hour  Intake              200 ml  Output             1200 ml  Net             -1000 ml   Filed Weights   04/08/17 0634  Weight: 153 lb 3.5 oz (69.5 kg)   Body mass index is 25.5 kg/m.  General:  Well nourished, well developed, in no acute distress HEENT: normal Lymph: no adenopathy Neck: no JVD Endocrine:  No thryomegaly Vascular: No carotid bruits Cardiac:  RRR; no  murmurs, gallops or rubs Lungs: end exp wheezes b/l, no crackles,rhonchi or rales  Abd: soft, nontender,not distended  Ext: no edema Musculoskeletal:  No deformities, BUE and BLE strength normal and equal Skin: warm and dry  Neuro:  no focal abnormalities noted Psych:  Normal affect   EKG:  The EKG was personally reviewed and demonstrates:   ST, 110bpm, PR 133ms, QRS 78ms, QTc 476ms Telemetry:  Telemetry was personally reviewed and demonstrates:   SR 60's, one NSVT 7 beats  Relevant CV Studies:  03/02/17: R/LHC Assessment:  1. Normal coronary arteries  2. NICM 35%  3. Low filling pressures with moderately to severely depressed cardiac output  02/27/17: TTE Study Conclusions - Left ventricle: The cavity size was mildly dilated. There was   mild-moderate concentric hypertrophy. Systolic function was   moderately to severely reduced. The estimated ejection fraction   was in the range of 30% to 35%. Diffuse hypokinesis. Doppler   parameters are consistent with abnormal left ventricular   relaxation (grade 1 diastolic dysfunction). Doppler parameters   are consistent with indeterminate ventricular filling pressure. - Aortic valve: Transvalvular velocity was within the normal range.   There was no stenosis. There was moderate regurgitation.   Regurgitation pressure half-time: 440 ms. - Mitral valve: Transvalvular velocity was within the normal range.   There was no evidence for stenosis. There was no regurgitation. - Right ventricle: The cavity size was normal. Wall thickness was   normal. Systolic function was normal. - Atrial septum: No defect or patent foramen ovale was  identified   by color flow Doppler. - Tricuspid valve: There was mild regurgitation. - Pulmonary arteries: Systolic pressure was within the normal   range. PA peak pressure: 28 mm Hg (S).  11/11/05: TTE SUMMARY - Overall left ventricular systolic function was normal. Left    ventricular ejection fraction was estimated , range being 55    % to 65 %. Findings were suggestive of hypokinesis of the    apical wall. Left ventricular diastolic function parameters    were normal.  Laboratory Data:  Chemistry Recent Labs Lab 04/07/17 2235 04/08/17 0921  NA 140 140  K 3.6 3.7  CL 104 107  CO2 23 27  GLUCOSE 266* 89  BUN 12 11  CREATININE 1.65* 1.19  CALCIUM 9.0 9.0  GFRNONAA 43* >60  GFRAA 49* >60  ANIONGAP 13 6     Recent Labs Lab 04/07/17 2235  PROT 6.3*  ALBUMIN 3.6  AST 57*  ALT 35  ALKPHOS 101  BILITOT 0.5   Hematology Recent Labs Lab 04/07/17 2235 04/08/17 0456  WBC 8.1 10.5  RBC 4.45 4.27  HGB 12.3* 11.7*  HCT 40.5 36.9*  MCV 91.0 86.4  MCH 27.6 27.4  MCHC 30.4 31.7  RDW 15.2 15.3  PLT 273 234   Cardiac Enzymes Recent Labs Lab 04/08/17 0456  TROPONINI 0.07*    Recent Labs Lab 04/07/17 2244  TROPIPOC 0.02    BNP Recent Labs Lab 04/07/17 2235  BNP 3,357.5*    DDimer No results for input(s): DDIMER in the last 168 hours.  Radiology/Studies:  Dg Chest Portable 1 View Result Date: 04/07/2017 CLINICAL DATA:  Altered mental status.  Shortness of breath. EXAM: PORTABLE CHEST 1 VIEW COMPARISON:  Radiograph 02/27/2017 FINDINGS: Unchanged cardiomegaly. Decreased hilar prominence from prior. Worsening pulmonary edema from prior exam. No large pleural effusion. Minimal fluid in the right minor fissure. No pneumothorax. Probable bibasilar atelectasis. IMPRESSION: Increased pulmonary edema. Increased fluid in the right  minor fissure. Cardiomegaly is stable. Electronically Signed   By: Jeb Levering M.D.   On: 04/07/2017 23:14     Assessment and Plan:   1. Syncope     In review of EMS,  the patient fainted/became unresponsive in their presence he was on monitor     There is no noted arrhythmia, particularly no VT     He has had only one nonsustained WCT/NSVT here      2. NICM, CHF     Much improved     C/w CHF team     If EF remains reduced after 3 months of therapy, would be candidate for ICD, narrow QRS would not qualify for CTR   For questions or updates, please contact [facility] Please consult [email or web address] for contact info under Cardiology/STEMI.   Signed, Baldwin Jamaica, PA-C  04/08/2017 11:13 AM  EP Attending  Patient seen and examined. He is dressed and about to be discharged home. We are asked to see regarding an episode of altered concsiousness which occurred in association with decompensated heart failure. He was on the heart monitor when he became unconscious. He has been aggessively diuresed and feels much better. He has newly diagnosed LV dysfunction in the setting of a non-ischemic CM. He has a h/o non-compliance. On exam today he appears stable and lungs are essentially clear and CV reveals a RRR with a soft S3. Exremities are without edema. Neuro non-focal. Tele reveals NSR. Echo and CXR are reviewed. In addition to guideline directed medical therapy, I would recommend her undergo repeat 2D echo in 3-4 months. I will see him back in the office after this to discuss ICD insertion which I briefly mentioned today. I think if he will stay on his diet, and take his meds as directed, it is very likely that he will have improvement in his LV function.  Mikle Bosworth.D.

## 2017-04-15 ENCOUNTER — Telehealth (HOSPITAL_COMMUNITY): Payer: Self-pay

## 2017-04-15 ENCOUNTER — Encounter (HOSPITAL_COMMUNITY): Payer: Self-pay

## 2017-04-15 ENCOUNTER — Ambulatory Visit (HOSPITAL_COMMUNITY)
Admit: 2017-04-15 | Discharge: 2017-04-15 | Disposition: A | Discharge status: Home / Self Care | Payer: Self-pay | Attending: Internal Medicine | Admitting: Internal Medicine

## 2017-04-15 VITALS — BP 174/96 | HR 90 | Wt 159.6 lb

## 2017-04-15 DIAGNOSIS — J449 Chronic obstructive pulmonary disease, unspecified: Secondary | ICD-10-CM | POA: Insufficient documentation

## 2017-04-15 DIAGNOSIS — N179 Acute kidney failure, unspecified: Secondary | ICD-10-CM | POA: Insufficient documentation

## 2017-04-15 DIAGNOSIS — F1011 Alcohol abuse, in remission: Secondary | ICD-10-CM | POA: Insufficient documentation

## 2017-04-15 DIAGNOSIS — I160 Hypertensive urgency: Secondary | ICD-10-CM

## 2017-04-15 DIAGNOSIS — I429 Cardiomyopathy, unspecified: Secondary | ICD-10-CM | POA: Insufficient documentation

## 2017-04-15 DIAGNOSIS — I10 Essential (primary) hypertension: Secondary | ICD-10-CM

## 2017-04-15 DIAGNOSIS — Z888 Allergy status to other drugs, medicaments and biological substances status: Secondary | ICD-10-CM | POA: Insufficient documentation

## 2017-04-15 DIAGNOSIS — Z809 Family history of malignant neoplasm, unspecified: Secondary | ICD-10-CM | POA: Insufficient documentation

## 2017-04-15 DIAGNOSIS — Z87891 Personal history of nicotine dependence: Secondary | ICD-10-CM | POA: Insufficient documentation

## 2017-04-15 DIAGNOSIS — Z7982 Long term (current) use of aspirin: Secondary | ICD-10-CM | POA: Insufficient documentation

## 2017-04-15 DIAGNOSIS — I5022 Chronic systolic (congestive) heart failure: Secondary | ICD-10-CM

## 2017-04-15 DIAGNOSIS — F17200 Nicotine dependence, unspecified, uncomplicated: Secondary | ICD-10-CM

## 2017-04-15 DIAGNOSIS — E785 Hyperlipidemia, unspecified: Secondary | ICD-10-CM | POA: Insufficient documentation

## 2017-04-15 DIAGNOSIS — I110 Hypertensive heart disease with heart failure: Secondary | ICD-10-CM | POA: Insufficient documentation

## 2017-04-15 DIAGNOSIS — Z8249 Family history of ischemic heart disease and other diseases of the circulatory system: Secondary | ICD-10-CM | POA: Insufficient documentation

## 2017-04-15 DIAGNOSIS — F1010 Alcohol abuse, uncomplicated: Secondary | ICD-10-CM

## 2017-04-15 DIAGNOSIS — Z79899 Other long term (current) drug therapy: Secondary | ICD-10-CM | POA: Insufficient documentation

## 2017-04-15 DIAGNOSIS — Z841 Family history of disorders of kidney and ureter: Secondary | ICD-10-CM | POA: Insufficient documentation

## 2017-04-15 DIAGNOSIS — Z885 Allergy status to narcotic agent status: Secondary | ICD-10-CM | POA: Insufficient documentation

## 2017-04-15 HISTORY — DX: Hypertensive urgency: I16.0

## 2017-04-15 LAB — BASIC METABOLIC PANEL
Anion gap: 5 (ref 5–15)
BUN: 7 mg/dL (ref 6–20)
CO2: 28 mmol/L (ref 22–32)
Calcium: 9.1 mg/dL (ref 8.9–10.3)
Chloride: 106 mmol/L (ref 101–111)
Creatinine, Ser: 0.99 mg/dL (ref 0.61–1.24)
GFR calc Af Amer: >60 mL/min (ref >60)
GFR calc non Af Amer: >60 mL/min (ref >60)
Glucose, Bld: 113 mg/dL — ABNORMAL HIGH (ref 65–99)
Potassium: 3.8 mmol/L (ref 3.5–5.1)
Sodium: 139 mmol/L (ref 135–145)

## 2017-04-15 MED ORDER — ISOSORB DINITRATE-HYDRALAZINE 20-37.5 MG PO TABS: 1.0000 | ORAL_TABLET | Freq: Three times a day (TID) | ORAL | 3 refills | Status: DC

## 2017-04-15 NOTE — Patient Instructions (Signed)
START Bidil 1 tab three times daily (once every 8 hours).  Follow up 2 weeks with CHF pharmacist Ileene Patrick.  ________________________________________________________________ Devon Vega Code: 8000  Follow up 4 weeks with Oda Kilts PA-C.  ________________________________________________________________ Devon Vega Code: [id number]

## 2017-04-15 NOTE — Telephone Encounter (Signed)
CHF Clinic appointment reminder call placed to patient for upcoming post-hospital follow up.  LVMTCB to confirm apt.  Patient also reminded to take all medications as prescribed on the day of his/her appointment and to bring all medications to this appointment.  Advised to call our office for tardiness or cancellations/rescheduling needs.  .Devon Vega  

## 2017-04-15 NOTE — Progress Notes (Signed)
Advanced Heart Failure Clinic Note   Primary Care: Dr. Berline Lopes Primary Cardiologist: Dr. Haroldine Laws   HPI:  Devon Vega is a 61 y.o. 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   Admitted 8/17-8/20/18 with acute dyspnea. Found to have marked hypertension in 180s and tachycardia in 110s. Medications adjusted with symptomatic improvement.  Echo with depressed EF as below.  Cath 03/02/17 with No CAD and low filling pressures.   Admitted 9/25 - 9/26 with dizziness and hypertensive urgency. He had a questionable syncopal episode, but was on EKG via EMS at the time with no arrythmia.   He presents today for post hospital follow up. BP elevated into 190s on arrival. States he took medications around 0930. Denies any SOB or further CP.  Walking 15-20 minutes most nights without DOE, including up hills. Denies palpitations, orthopnea, PND, or peripheral edema. No lightheadedness or dizziness. Denies ETOH use.  Denies drug use. Pt denies HA, Vision changes, slurred speech, confusion, syncope, or weakness.   Echo 02/27/17 LVEF 30-35%, Grade 1 DD, Mod AS, Mild TR, PA peak pressure 28 mm Hg.  Echo 04/08/17 LVEF 35-40%, Mild AI, Mild MR.   [facility] 03/02/17 Ao = 103/53 (75) LV =  121/4 RA = 1 RV = 17/3 PA = 17/5 (10) PCW = 4 Fick cardiac output/index = 3.3/1.9 PVR = 1.9 WU SVR = 1772 FA sat = 93% PA sat = 58%, 58%  Assessment:  1. Normal coronary arteries  2. NICM 35%  3. Low filling pressures with moderately to severely depressed cardiac output  Review of systems complete and found to be negative unless listed in HPI.    Past Medical History:  Diagnosis Date  . Alcohol abuse    hx of , sober since 1997  . COPD (chronic obstructive pulmonary disease) ([place])   . Hyperlipidemia   . Hypertension   . 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 Sinus arrhythmia 05/24/2014  . Tobacco abuse    failed zyban  . Weight gain       Current Outpatient Prescriptions  Medication Sig Dispense Refill  . aspirin EC 81 MG tablet Take 81 mg by mouth daily.    . carvedilol (COREG) 25 MG tablet Take 12.5 mg by mouth 2 (two) times daily with a meal.    . fluticasone (FLONASE) 50 MCG/ACT nasal spray Place 1 spray into both nostrils daily as needed for allergies or rhinitis.    . rosuvastatin (CRESTOR) 10 MG tablet Take 1 tablet (10 mg total) by mouth daily. 90 tablet 3  . sacubitril-valsartan (ENTRESTO) 97-103 MG Take 1 tablet by mouth 2 (two) times daily. 180 tablet 3  . spironolactone (ALDACTONE) 25 MG tablet Take 1 tablet (25 mg total) by mouth daily. 30 tablet 6   No current facility-administered medications for this encounter.    Allergies  Allergen Reactions  . Codeine Cough  . Lisinopril Cough    Social History   Social History  . Marital status: Divorced    Spouse name: N/A  . Number of children: N/A  . Years of education: N/A   Occupational History  . Not on file.   Social History Main Topics  . Smoking status: Former Smoker    Packs/day: 0.75    Types: Cigarettes, Cigars    Quit date: 03/30/2017  . Smokeless tobacco: Never Used     Comment: pt knows he neesd to quit  .  Alcohol use 0.0 oz/week     Comment: 2 beers/day  . Drug use: Yes    Types: Marijuana  . Sexual activity: Not on file   Other Topics Concern  . 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 Cancer Father   . Renal Disease Brother        On Dialysis  . Heart failure Brother   . Hypertension Brother   . Hypertension Mother     Vitals:   04/15/17 1042 04/15/17 1111  BP: (!) 196/110 (!) 174/96   Pulse: 90   SpO2: 100%   Weight: 159 lb 9.6 oz (72.4 kg)    Wt Readings from Last 3 Encounters:  04/15/17 159 lb 9.6 oz (72.4 kg)  04/08/17 153 lb 3.5 oz (69.5 kg)  03/18/17 158 lb 6.4 oz (71.8 kg)   PHYSICAL EXAM: General:  Well appearing. No respiratory difficulty HEENT: normal Neck: supple. no JVD. Carotids 2+ bilat; no bruits. No lymphadenopathy or thyromegaly appreciated. Cor: PMI nondisplaced. Regular rate & rhythm. No rubs, gallops or murmurs. Lungs: clear Abdomen: soft, nontender, nondistended. No hepatosplenomegaly. No bruits or masses. Good bowel sounds. Extremities: no cyanosis, clubbing, rash, edema Neuro: alert & oriented x 3, cranial nerves grossly intact. moves all 4 extremities w/o difficulty. Affect pleasant.  ASSESSMENT & PLAN:  1. Chronic systolic HF - Echo 1/61/09 LVEF 35-40%, Mild AI, Mild MR.  - R/LHC 03/02/17 with normal coronaries and low filling pressures.  - NYHA II symptoms.  - Volume status stable on exam - Continue entresto 97/103 mg BID. BMET today with recent change - Continue spiro 25 mg daily.  - Continue coreg 12.5 mg BID - Possible HTN cardiomyopathy vs ETOH cardiomyopathy. No CAD.   2. Hypertensive urgency - No s/s end organ damage.  - Meds as above. Improved with administration of BiDil.   3. Tobacco use - Encouraged complete cessation. No change.   4. ETOH abuse - Now abstinent. Encouraged to continue. No change.  5. Recent AKI - BMET today.   Added Bidil as above for HTN. RTC 2 weeks with pharm-D visit for med titration. RTC 4 weeks for provider visit. Labs today. Pt reviewed alarm symptoms and instructed to keep BP log at home. Knows to alert [place] immediately with BPs over 190 at home, or to report to ED with any alarm symptoms.   Shirley Friar, PA-C 04/15/17   Greater than 50% of the 25 minute visit was spent in counseling/coordination of care regarding disease state education, alarm symptoms of hypertension,  salt/fluid restriction, and medication reconciliation.

## 2017-04-17 ENCOUNTER — Telehealth (HOSPITAL_COMMUNITY): Payer: Self-pay | Admitting: Cardiology

## 2017-04-17 MED ORDER — ISOSORB DINITRATE-HYDRALAZINE 20-37.5 MG PO TABS: 1.5000 | ORAL_TABLET | Freq: Three times a day (TID) | ORAL | 3 refills | Status: DC

## 2017-04-17 NOTE — Telephone Encounter (Signed)
Please increase Bidil to 1.5 tabs TID. Keep appt. With [place]. Thanks!

## 2017-04-17 NOTE — Telephone Encounter (Signed)
Pt aware.

## 2017-04-17 NOTE — Telephone Encounter (Signed)
    Advanced Heart Failure Triage Encounter  Patient Name: Devon Vega  Date of Call: 04/17/17  Problem:  Patient called to report elevated b/p reading this AM 182/105 meds were taken 30 mins prior to reading 164/90 on 04/16/17  Patient is very concerned about elevated reading.   Also reports he has developed a cough since starting BIDIL  Plan: Advised to waiit 60-90 mins after taking meds to check reading, continue to take meds daily as prescribed, may need to increase b/p medications and this would be done at follow up in 2 weeks. Will forward to provider for further instructions   Advised if he starts to have chest pains, increased SOB, HA, extreme dizziness/confusion-he should report to ER immediatly   Kerry Dory, CMA

## 2017-04-27 ENCOUNTER — Telehealth (HOSPITAL_COMMUNITY): Payer: Self-pay | Admitting: Pharmacist

## 2017-04-27 ENCOUNTER — Encounter (HOSPITAL_COMMUNITY): Payer: Self-pay

## 2017-04-27 NOTE — Telephone Encounter (Signed)
To complete Bidil patient assistance application, will need SS award letter. Patient aware and will try to bring to our appointment on 10/18.   Devon Vega. Velva Harman, PharmD, BCPS, CPP Clinical Pharmacist Pager: [phone] Phone: [phone] 04/27/2017 9:34 AM

## 2017-04-30 ENCOUNTER — Ambulatory Visit (HOSPITAL_COMMUNITY)
Admission: RE | Admit: 2017-04-30 | Discharge: 2017-04-30 | Disposition: A | Discharge status: Home / Self Care | Payer: Self-pay | Source: Ambulatory Visit | Attending: Cardiology | Admitting: Cardiology

## 2017-04-30 ENCOUNTER — Encounter (HOSPITAL_COMMUNITY): Payer: Self-pay

## 2017-04-30 DIAGNOSIS — F1011 Alcohol abuse, in remission: Secondary | ICD-10-CM | POA: Insufficient documentation

## 2017-04-30 DIAGNOSIS — E785 Hyperlipidemia, unspecified: Secondary | ICD-10-CM | POA: Insufficient documentation

## 2017-04-30 DIAGNOSIS — I160 Hypertensive urgency: Secondary | ICD-10-CM | POA: Insufficient documentation

## 2017-04-30 DIAGNOSIS — J449 Chronic obstructive pulmonary disease, unspecified: Secondary | ICD-10-CM | POA: Insufficient documentation

## 2017-04-30 DIAGNOSIS — N179 Acute kidney failure, unspecified: Secondary | ICD-10-CM | POA: Insufficient documentation

## 2017-04-30 DIAGNOSIS — Z720 Tobacco use: Secondary | ICD-10-CM | POA: Insufficient documentation

## 2017-04-30 DIAGNOSIS — I110 Hypertensive heart disease with heart failure: Secondary | ICD-10-CM | POA: Insufficient documentation

## 2017-04-30 DIAGNOSIS — I5022 Chronic systolic (congestive) heart failure: Secondary | ICD-10-CM | POA: Insufficient documentation

## 2017-04-30 MED ORDER — ISOSORB DINITRATE-HYDRALAZINE 20-37.5 MG PO TABS
2.0000 | ORAL_TABLET | Freq: Three times a day (TID) | ORAL | 5 refills | Status: DC
Start: 2017-04-30 — End: 2017-07-24

## 2017-04-30 MED ORDER — CARVEDILOL 25 MG PO TABS: 25.0000 mg | ORAL_TABLET | Freq: Two times a day (BID) | ORAL | 11 refills | Status: DC

## 2017-04-30 NOTE — Patient Instructions (Signed)
It was great to see you today!  Please INCREASE your carvedilol to 1 whole tablet (25 mg) TWICE DAILY.   Please INCREASE your Bidil to 2 tablets THREE TIMES DAILY.   **You can take Tylenol 500 mg 30-60 minutes before your last dose of Bidil to help with headaches.   Please keep your appointment with Oda Kilts, PA-C on 05/14/17.

## 2017-04-30 NOTE — Progress Notes (Signed)
HF MD: Devon Vega  HPI:  Devon Vega is a 61 y.o. African American male with systolic CHF due to NICM, poorly controlled HTN, HLD, COPD, tobacco abuse, and alcohol abuse.   Admitted 8/17-8/20/18 with acute dyspnea. Found to have marked hypertension in 180s and tachycardia in 110s. Medications adjusted with symptomatic improvement.  Echo with depressed EF as below.  Cath 03/02/17 with No CAD and low filling pressures.   Admitted 9/25 - 9/26 with dizziness and hypertensive urgency. He had a questionable syncopal episode, but was on EKG via EMS at the time with no arrythmia.   He presents today for pharmacist-led HF medication titration. At last HF clinic visit on 04/15/17, he was started on Bidil 1 tab TID but a few days later his BP was elevated in the 180s so his Bidil was increased to 1.5 tabs TID. BP elevated into 180s on arrival. States he took all medications this am and has been compliant with all of his medications. He is able to tell me how he takes his medications as well. Denies any SOB or further CP.  Walking or riding bicycle 15-20 minutes most nights without DOE, including up hills. Denies palpitations, orthopnea, PND, or peripheral edema. No lightheadedness or dizziness. Denies ETOH use. Has quit smoking tobacco about 2 weeks ago.     Marland Kitchen Shortness of breath/dyspnea on exertion? no  . Orthopnea/PND? no . Edema? no . Lightheadedness/dizziness? no . Daily weights at home? no . Blood pressure/heart rate monitoring at home? Yes - BP 174/81-180/91 . Following low-sodium/fluid-restricted diet? Yes - staying away from salty fried foods, salads/greens; 1 cup of coffee daily and 1 soda a day   HF Medications: Carvedilol 12.5 mg PO BID Bidil 1.5 tabs PO TID Entresto 97-103 mg PO BID Spironolactone 25 mg PO daily   Has the patient been experiencing any side effects to the medications prescribed?  Yes - slight headache after evening dose of Bidil   Does the patient have any problems  obtaining medications due to transportation or finances?   Yes - no insurance currently, but will have insurance in January -- Entresto patient assistance and working on [employer] patient assistance (samples for now)  Understanding of regimen: good Understanding of indications: good Potential of compliance: good Patient understands to avoid NSAIDs. Patient understands to avoid decongestants.    Pertinent Lab Values: . 04/15/17: Serum creatinine 0.99, BUN 7, Potassium 3.8, Sodium 139 . 04/08/17: Magnesium 2.1  Vital Signs: . Weight: 156 lb (last HF clinic visit weight: 159 lb) . Blood pressure: 187/81 mmHg  . Heart rate: 72 bpm    Assessment: 1. Chronicsystolic CHF (EF 62-95% on 04/08/17), due to NICM (?HTN CM vs ETOH). NYHA class IIsymptoms.  - Volume status stable  - Increase carvedilol to goal 25 mg BID and Bidil to 2 tabs TID (may take APAP 30 mins prior to evening dose to help with headaches)  - Continue Entresto 97-103 mg BID and spironolactone 25 mg daily  - Basic disease state pathophysiology, medication indication, mechanism and side effects reviewed at length with patient and he verbalized understanding 2. Hypertensive urgency - No s/s end organ damage.  - Increase carvedilol and Bidil as above - If BP still not at goal <130/90 mmHg, can consider increased spironolactone or addition of amlodipine at next visit 3. Tobacco use - Quit 2 weeks ago! - Congratulated on cessation 4. ETOH abuse - Now abstinent. Encouraged to continue. No change 5. Recent AKI - BMET at next visit  Plan: 1) Medication changes: Based on clinical presentation, vital signs and recent labs will increase carvedilol 25 mg BID and Bidil 2 tabs TID 2) Labs: BMET in 2 weeks 3) Follow-up: APP clinic on 05/14/17   [name], PharmD, BCPS, CPP Clinical Pharmacist Pager: [phone] Phone: [phone] 04/30/2017 10:53 AM

## 2017-05-13 ENCOUNTER — Telehealth (HOSPITAL_COMMUNITY): Payer: Self-pay

## 2017-05-13 NOTE — Telephone Encounter (Signed)
CHF Clinic appointment reminder call placed to patient for upcoming post-hospital follow up.  Does understand purpose of this appointment and where CHF Clinic is located? Yes  How is patient feeling? "Very well"  Does patient have all of their medications since their recent discharge? Yes  Patient also reminded to take all medications as prescribed on the day of his/her appointment and to bring all medications to this appointment.  Advised to call our office for tardiness or cancellations/rescheduling needs.  Renee Pain

## 2017-05-14 ENCOUNTER — Ambulatory Visit (HOSPITAL_COMMUNITY)
Admission: RE | Admit: 2017-05-14 | Discharge: 2017-05-14 | Disposition: A | Discharge status: Home / Self Care | Payer: Self-pay | Source: Ambulatory Visit | Attending: Internal Medicine | Admitting: Internal Medicine

## 2017-05-14 ENCOUNTER — Encounter (HOSPITAL_COMMUNITY): Payer: Self-pay

## 2017-05-14 VITALS — BP 138/68 | HR 63 | Wt 162.0 lb

## 2017-05-14 DIAGNOSIS — Z79899 Other long term (current) drug therapy: Secondary | ICD-10-CM | POA: Insufficient documentation

## 2017-05-14 DIAGNOSIS — E785 Hyperlipidemia, unspecified: Secondary | ICD-10-CM | POA: Insufficient documentation

## 2017-05-14 DIAGNOSIS — I110 Hypertensive heart disease with heart failure: Secondary | ICD-10-CM | POA: Insufficient documentation

## 2017-05-14 DIAGNOSIS — I10 Essential (primary) hypertension: Secondary | ICD-10-CM

## 2017-05-14 DIAGNOSIS — J449 Chronic obstructive pulmonary disease, unspecified: Secondary | ICD-10-CM | POA: Insufficient documentation

## 2017-05-14 DIAGNOSIS — F17200 Nicotine dependence, unspecified, uncomplicated: Secondary | ICD-10-CM

## 2017-05-14 DIAGNOSIS — I5022 Chronic systolic (congestive) heart failure: Secondary | ICD-10-CM | POA: Insufficient documentation

## 2017-05-14 DIAGNOSIS — F1010 Alcohol abuse, uncomplicated: Secondary | ICD-10-CM

## 2017-05-14 DIAGNOSIS — Z7982 Long term (current) use of aspirin: Secondary | ICD-10-CM | POA: Insufficient documentation

## 2017-05-14 DIAGNOSIS — F17210 Nicotine dependence, cigarettes, uncomplicated: Secondary | ICD-10-CM | POA: Insufficient documentation

## 2017-05-14 DIAGNOSIS — N179 Acute kidney failure, unspecified: Secondary | ICD-10-CM | POA: Insufficient documentation

## 2017-05-14 LAB — BASIC METABOLIC PANEL
Anion gap: 7 (ref 5–15)
BUN: 8 mg/dL (ref 6–20)
CO2: 26 mmol/L (ref 22–32)
Calcium: 8.9 mg/dL (ref 8.9–10.3)
Chloride: 107 mmol/L (ref 101–111)
Creatinine, Ser: 1.06 mg/dL (ref 0.61–1.24)
GFR calc Af Amer: >60 mL/min (ref >60)
GFR calc non Af Amer: >60 mL/min (ref >60)
Glucose, Bld: 118 mg/dL — ABNORMAL HIGH (ref 65–99)
Potassium: 3.8 mmol/L (ref 3.5–5.1)
Sodium: 140 mmol/L (ref 135–145)

## 2017-05-14 MED ORDER — AMLODIPINE BESYLATE 5 MG PO TABS: 5.0000 mg | ORAL_TABLET | Freq: Every day | ORAL | 6 refills | Status: DC

## 2017-05-14 NOTE — Progress Notes (Signed)
Advanced Heart Failure Clinic Note   Primary Care: Dr. Berline Lopes Primary Cardiologist: Dr. Haroldine Laws   HPI:  Devon Vega is a 61 y.o. 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   Admitted 8/17-8/20/18 with acute dyspnea. Found to have marked hypertension in 180s and tachycardia in 110s. Medications adjusted with symptomatic improvement.  Echo with depressed EF as below.  Cath 03/02/17 with No CAD and low filling pressures.   Admitted 9/25 - 9/26 with dizziness and hypertensive urgency. He had a questionable syncopal episode, but was on EKG via EMS at the time with no arrythmia.   He presents today for regular follow up. Doing very well. BP 130-150 range at home now. Continues to walk or bike 15-20 minutes nightly weather permitting. Denies SOB or DOE. No palpitations, orthopnea, edema, or PND. Denies lightheadedness or dizziness. Does have mild HA after evening imdur, but improves with Tylenol.   Echo 02/27/17 LVEF 30-35%, Grade 1 DD, Mod AS, Mild TR, PA peak pressure 28 mm Hg.  Echo 04/08/17 LVEF 35-40%, Mild AI, Mild MR.   [facility] 03/02/17 Ao = 103/53 (75) LV =  121/4 RA = 1 RV = 17/3 PA = 17/5 (10) PCW = 4 Fick cardiac output/index = 3.3/1.9 PVR = 1.9 WU SVR = 1772 FA sat = 93% PA sat = 58%, 58%  Assessment:  1. Normal coronary arteries  2. NICM 35%  3. Low filling pressures with moderately to severely depressed cardiac output  Review of systems complete and found to be negative unless listed in HPI.    Past Medical History:  Diagnosis Date  . Alcohol abuse    hx of , sober since 1997  . COPD (chronic obstructive pulmonary disease) ([place])   . Hyperlipidemia   . Hypertension   . 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 Sinus arrhythmia 05/24/2014  . Tobacco abuse    failed zyban  . Weight gain     Current Outpatient Prescriptions  Medication Sig Dispense Refill  . aspirin EC 81 MG tablet Take  81 mg by mouth daily.    . carvedilol (COREG) 25 MG tablet Take 1 tablet (25 mg total) by mouth 2 (two) times daily with a meal. 60 tablet 11  . fluticasone (FLONASE) 50 MCG/ACT nasal spray Place 1 spray into both nostrils daily as needed for allergies or rhinitis.    Marland Kitchen isosorbide-hydrALAZINE (BIDIL) 20-37.5 MG tablet Take 2 tablets by mouth 3 (three) times daily. 180 tablet 5  . rosuvastatin (CRESTOR) 10 MG tablet Take 1 tablet (10 mg total) by mouth daily. 90 tablet 3  . sacubitril-valsartan (ENTRESTO) 97-103 MG Take 1 tablet by mouth 2 (two) times daily. 180 tablet 3  . spironolactone (ALDACTONE) 25 MG tablet Take 1 tablet (25 mg total) by mouth daily. 30 tablet 6   No current facility-administered medications for this encounter.    Allergies  Allergen Reactions  . Codeine Cough  . Lisinopril Cough    Social History   Social History  . Marital status: Divorced    Spouse name: N/A  . Number of children: N/A  . Years of education: N/A   Occupational History  . Not on file.   Social History Main Topics  . Smoking status: Former Smoker    Packs/day: 0.75    Types: Cigarettes, Cigars    Quit date: 03/30/2017  . Smokeless tobacco: Never Used     Comment: pt  knows he neesd to quit  . Alcohol use 0.0 oz/week     Comment: 2 beers/day  . Drug use: Yes    Types: Marijuana  . Sexual activity: Not on file   Other Topics Concern  . 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 Cancer Father   . Renal Disease Brother        On Dialysis  . Heart failure Brother   . Hypertension Brother   . Hypertension Mother    Vitals:   05/14/17 1029  BP: 138/68    Pulse: 63  SpO2: 100%  Weight: 162 lb (73.5 kg)   Wt Readings from Last 3 Encounters:  05/14/17 162 lb (73.5 kg)  04/30/17 156 lb 2 oz (70.8 kg)  04/15/17 159 lb 9.6 oz (72.4 kg)   PHYSICAL EXAM: General: Well appearing. No resp difficulty. HEENT: Normal Neck: Supple. JVP 5-6. Carotids 2+ bilat; no bruits. No thyromegaly or nodule noted. Cor: PMI nondisplaced. RRR, No M/G/R noted Lungs: CTAB, normal effort. Abdomen: Soft, non-tender, non-distended, no HSM. No bruits or masses. +BS  Extremities: No cyanosis, clubbing, or rash. R and LLE no edema.  Neuro: Alert & orientedx3, cranial nerves grossly intact. moves all 4 extremities w/o difficulty. Affect pleasant   ASSESSMENT & PLAN:  1. Chronic systolic HF - Echo 12/30/48 LVEF 35-40%, Mild AI, Mild MR.  - R/LHC 03/02/17 with normal coronaries and low filling pressures.  - NYHA II symptoms.  - Volume status stable on exam. No need for lasix at this time.  - Continue entresto 97/103 mg BID. BMET today with recent increase.  - Continue spiro 25 mg daily.  - Continue coreg 25 mg BID - Continue Bidil 2 tabs TID - Possible HTN cardiomyopathy vs ETOH cardiomyopathy. No CAD.  - Plan for repeat Echo after next visit.   2. Hypertension - Much improved on meds as above.  - Add amlodipine 5 mg daily.   3. Tobacco use - Encouraged complete cessation. No change.   4. ETOH abuse - Now abstinent. He would like to have 1-2 beverages a week. Encouraged to avoid.   5. Recent AKI - BMET today.   Doing well overall. BP control much improved. Meds and labs as above. RTC 6-8 weeks. Will plan on repeat echo early next year.   Shirley Friar, PA-C 05/14/17   Greater than 50% of the 25 minute visit was spent in counseling/coordination of care regarding disease state education, salt/fluid restrictions, and medication reconciliation.

## 2017-05-14 NOTE — Patient Instructions (Signed)
START Norvasc 5 mg tablet once daily.  Routine lab work today. Will notify you of abnormal results, otherwise no news is good news!  Follow up 6-8 weeks with Oda Kilts PA-C.  Take all medication as prescribed the day of your appointment. Bring all medications with you to your appointment.  Do the following things EVERYDAY: 1) Weigh yourself in the morning before breakfast. Write it down and keep it in a log. 2) Take your medicines as prescribed 3) Eat low salt foods-Limit salt (sodium) to 2000 mg per day.  4) Stay as active as you can everyday 5) Limit all fluids for the day to less than 2 liters

## 2017-07-14 NOTE — Progress Notes (Signed)
Advanced Heart Failure Clinic Note   Primary Care: Dr. Berline Lopes Primary Cardiologist: Dr. Haroldine Laws   HPI:  Devon Vega is a 62 y.o. 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   Admitted 8/17-8/20/18 with acute dyspnea. Found to have marked hypertension in 180s and tachycardia in 110s. Medications adjusted with symptomatic improvement.  Echo with depressed EF as below.  Cath 03/02/17 with No CAD and low filling pressures.   Admitted 9/25 - 9/26 with dizziness and hypertensive urgency. He had a questionable syncopal episode, but was on EKG via EMS at the time with no arrythmia.   Today he returns for HF follow up. Last visit amlodipine 5 mg added due to ongoing HTN. Overall feeling fine. Denies SOB/PND/Orthopnea. Appetite ok. No fever or chills. Weight at home has gone up a few pounds after the holidays. Has not been walking much.  Taking all medications. Smoking 3-4 cigarettes per day. Not drinking alchol.   Echo 02/27/17 LVEF 30-35%, Grade 1 DD, Mod AS, Mild TR, PA peak pressure 28 mm Hg.  Echo 04/08/17 LVEF 35-40%, Mild AI, Mild MR.   [facility] 03/02/17 Ao = 103/53 (75) LV =  121/4 RA = 1 RV = 17/3 PA = 17/5 (10) PCW = 4 Fick cardiac output/index = 3.3/1.9 PVR = 1.9 WU SVR = 1772 FA sat = 93% PA sat = 58%, 58% Assessment:  1. Normal coronary arteries  2. NICM 35%  3. Low filling pressures with moderately to severely depressed cardiac output  Review of systems complete and found to be negative unless listed in HPI.    Past Medical History:  Diagnosis Date  . Alcohol abuse    hx of , sober since 1997  . COPD (chronic obstructive pulmonary disease) ([place])   . Hyperlipidemia   . Hypertension   . 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 Sinus arrhythmia 05/24/2014  . Tobacco abuse    failed zyban  . Weight gain     Current Outpatient Medications  Medication Sig Dispense Refill  . amLODipine  (NORVASC) 5 MG tablet Take 1 tablet (5 mg total) by mouth daily. 30 tablet 6  . aspirin EC 81 MG tablet Take 81 mg by mouth daily.    . carvedilol (COREG) 25 MG tablet Take 1 tablet (25 mg total) by mouth 2 (two) times daily with a meal. 60 tablet 11  . fluticasone (FLONASE) 50 MCG/ACT nasal spray Place 1 spray into both nostrils daily as needed for allergies or rhinitis.    Marland Kitchen isosorbide-hydrALAZINE (BIDIL) 20-37.5 MG tablet Take 2 tablets by mouth 3 (three) times daily. 180 tablet 5  . rosuvastatin (CRESTOR) 10 MG tablet Take 1 tablet (10 mg total) by mouth daily. 90 tablet 3  . sacubitril-valsartan (ENTRESTO) 97-103 MG Take 1 tablet by mouth 2 (two) times daily. 180 tablet 3  . spironolactone (ALDACTONE) 25 MG tablet Take 1 tablet (25 mg total) by mouth daily. 30 tablet 6   No current facility-administered medications for this encounter.    Allergies  Allergen Reactions  . Codeine Cough  . Lisinopril Cough    Social History   Socioeconomic History  . Marital status: Divorced    Spouse name: Not on file  . Number of children: Not on file  . Years of education: Not on file  . Highest education level: Not on file  Social Needs  . Financial resource strain: Not on file  .  Food insecurity - worry: Not on file  . Food insecurity - inability: Not on file  . Transportation needs - medical: Not on file  . Transportation needs - non-medical: Not on file  Occupational History  . Not on file  Tobacco Use  . Smoking status: Former Smoker    Packs/day: 0.75    Types: Cigarettes, Cigars    Last attempt to quit: 03/30/2017    Years since quitting: 0.2  . Smokeless tobacco: Never Used  . Tobacco comment: pt knows he neesd to quit  Substance and Sexual Activity  . Alcohol use: Yes    Alcohol/week: 0.0 oz    Comment: 2 beers/day  . Drug use: Yes    Types: Marijuana  . Sexual activity: Not on file  Other Topics Concern  . 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 Cancer Father   . Renal Disease Brother        On Dialysis  . Heart failure Brother   . Hypertension Brother   . Hypertension Mother    Vitals:   07/15/17 1056  BP: (!) 162/72  Pulse: 60  SpO2: 100%  Weight: 168 lb (76.2 kg)   Wt Readings from Last 3 Encounters:  07/15/17 168 lb (76.2 kg)  05/14/17 162 lb (73.5 kg)  04/30/17 156 lb 2 oz (70.8 kg)   PHYSICAL EXAM: General:  Well appearing. No resp difficulty. Walked in the clinic without difficulty.  HEENT: normal Neck: supple. no JVD. Carotids 2+ bilat; no bruits. No lymphadenopathy or thryomegaly appreciated. Cor: PMI nondisplaced. Regular rate & rhythm. No rubs, gallops or murmurs. Lungs: clear Abdomen: soft, nontender, nondistended. No hepatosplenomegaly. No bruits or masses. Good bowel sounds. Extremities: no cyanosis, clubbing, rash, edema Neuro: alert & orientedx3, cranial nerves grossly intact. moves all 4 extremities w/o difficulty. Affect pleasant  ASSESSMENT & PLAN: 1. Chronic systolic HF - Echo 8/58/85 LVEF 35-40%, Mild AI, Mild MR.  - R/LHC 03/02/17 with normal coronaries and low filling pressures.  - NYHA I. Volume status stable despite weight gain. Does not need lasix.  - On goal dose entresto, carvedilol, and bidil.  -Continue spiro 25 mg daily.  -- Possible HTN cardiomyopathy vs ETOH cardiomyopathy. No CAD. -Set up repeat ECHO next visit.  Would like BP controlled.  2. Hypertension Elevated. Increase amlodipine to 10 mg daily.  Follow up in 2 weeks for BP check  -3. Tobacco use Discussed smoking cessation.  4. ETOH abuse Not drinking. Congratulated  Follow up in 2 weeks for BP check and 8 weeks for  visit.   Darrick Grinder, NP 07/15/17

## 2017-07-15 ENCOUNTER — Ambulatory Visit (HOSPITAL_COMMUNITY)
Admission: RE | Admit: 2017-07-15 | Discharge: 2017-07-15 | Disposition: A | Discharge status: Home / Self Care | Payer: BLUE CROSS/BLUE SHIELD | Source: Ambulatory Visit | Attending: Cardiology | Admitting: Cardiology

## 2017-07-15 ENCOUNTER — Encounter (HOSPITAL_COMMUNITY): Payer: Self-pay

## 2017-07-15 VITALS — BP 162/72 | HR 60 | Wt 168.0 lb

## 2017-07-15 DIAGNOSIS — I110 Hypertensive heart disease with heart failure: Secondary | ICD-10-CM | POA: Insufficient documentation

## 2017-07-15 DIAGNOSIS — F1010 Alcohol abuse, uncomplicated: Secondary | ICD-10-CM | POA: Diagnosis not present

## 2017-07-15 DIAGNOSIS — J449 Chronic obstructive pulmonary disease, unspecified: Secondary | ICD-10-CM | POA: Insufficient documentation

## 2017-07-15 DIAGNOSIS — E7849 Other hyperlipidemia: Secondary | ICD-10-CM | POA: Diagnosis not present

## 2017-07-15 DIAGNOSIS — F17200 Nicotine dependence, unspecified, uncomplicated: Secondary | ICD-10-CM | POA: Diagnosis not present

## 2017-07-15 DIAGNOSIS — Z87891 Personal history of nicotine dependence: Secondary | ICD-10-CM | POA: Insufficient documentation

## 2017-07-15 DIAGNOSIS — E785 Hyperlipidemia, unspecified: Secondary | ICD-10-CM | POA: Diagnosis not present

## 2017-07-15 DIAGNOSIS — I10 Essential (primary) hypertension: Secondary | ICD-10-CM | POA: Diagnosis not present

## 2017-07-15 DIAGNOSIS — I428 Other cardiomyopathies: Secondary | ICD-10-CM | POA: Diagnosis not present

## 2017-07-15 DIAGNOSIS — Z79899 Other long term (current) drug therapy: Secondary | ICD-10-CM | POA: Insufficient documentation

## 2017-07-15 DIAGNOSIS — I5022 Chronic systolic (congestive) heart failure: Secondary | ICD-10-CM | POA: Diagnosis not present

## 2017-07-15 DIAGNOSIS — Z7982 Long term (current) use of aspirin: Secondary | ICD-10-CM | POA: Diagnosis not present

## 2017-07-15 MED ORDER — ROSUVASTATIN CALCIUM 10 MG PO TABS: 10.0000 mg | ORAL_TABLET | Freq: Every day | ORAL | 3 refills | Status: DC

## 2017-07-15 MED ORDER — AMLODIPINE BESYLATE 10 MG PO TABS: 10.0000 mg | ORAL_TABLET | Freq: Every day | ORAL | 6 refills | Status: DC

## 2017-07-15 NOTE — Patient Instructions (Signed)
INCREASE Norvasc (Amlodipine) to 10 mg once daily. Can double up on current 5 mg tablet (Take 2 tablets once daily). New Rx has been sent to your pharmacy for 10 mg tablets (Take 1 tab once daily).  Follow up 2 weeks for blood pressure check with the nurse.  Follow up 8 weeks with Dr. Haroldine Laws.  Take all medication as prescribed the day of your appointment. Bring all medications with you to your appointment.  Do the following things EVERYDAY: 1) Weigh yourself in the morning before breakfast. Write it down and keep it in a log. 2) Take your medicines as prescribed 3) Eat low salt foods-Limit salt (sodium) to 2000 mg per day.  4) Stay as active as you can everyday 5) Limit all fluids for the day to less than 2 liters

## 2017-07-20 ENCOUNTER — Encounter (HOSPITAL_COMMUNITY): Payer: Self-pay | Admitting: Pharmacist

## 2017-07-24 ENCOUNTER — Other Ambulatory Visit (HOSPITAL_COMMUNITY): Payer: Self-pay | Admitting: *Deleted

## 2017-07-24 ENCOUNTER — Telehealth (HOSPITAL_COMMUNITY): Payer: Self-pay | Admitting: Surgery

## 2017-07-24 MED ORDER — ISOSORB DINITRATE-HYDRALAZINE 20-37.5 MG PO TABS: 2.0000 | ORAL_TABLET | Freq: Three times a day (TID) | ORAL | 5 refills | Status: DC

## 2017-07-24 NOTE — Telephone Encounter (Signed)
I received a call from patient.  He left a message and did not indicate his need.  I called back and left a message.  I asked him to return my call or call the clinic if the need was urgent.

## 2017-07-29 ENCOUNTER — Ambulatory Visit (HOSPITAL_COMMUNITY)
Admission: RE | Admit: 2017-07-29 | Discharge: 2017-07-29 | Disposition: A | Discharge status: Home / Self Care | Payer: BLUE CROSS/BLUE SHIELD | Source: Ambulatory Visit | Attending: Internal Medicine | Admitting: Internal Medicine

## 2017-07-29 ENCOUNTER — Encounter (HOSPITAL_COMMUNITY): Payer: Self-pay | Admitting: Pharmacist

## 2017-07-29 VITALS — BP 151/74 | HR 72

## 2017-07-29 DIAGNOSIS — Z0130 Encounter for examination of blood pressure without abnormal findings: Secondary | ICD-10-CM | POA: Diagnosis not present

## 2017-07-29 DIAGNOSIS — I5022 Chronic systolic (congestive) heart failure: Secondary | ICD-10-CM | POA: Diagnosis not present

## 2017-07-29 NOTE — Progress Notes (Signed)
Pt came today for a blood pressure check. Patient took his bp meds less than an hour ago but reports his bp reading at home have been about 130/70. Todays bp reading was 151/74. Per Amy no med changes at this time continue to monitor bp (check 2 hours after taking medication) if bp starts to rise call clinic. Patient agreeable with plan. Pt has scheduled follow up 09/10/17.

## 2017-09-10 ENCOUNTER — Encounter (HOSPITAL_COMMUNITY): Payer: Self-pay | Admitting: Internal Medicine

## 2017-09-10 ENCOUNTER — Ambulatory Visit (HOSPITAL_COMMUNITY)
Admission: RE | Admit: 2017-09-10 | Discharge: 2017-09-10 | Disposition: A | Discharge status: Home / Self Care | Payer: BLUE CROSS/BLUE SHIELD | Source: Ambulatory Visit | Attending: Internal Medicine | Admitting: Internal Medicine

## 2017-09-10 ENCOUNTER — Other Ambulatory Visit: Payer: Self-pay

## 2017-09-10 VITALS — BP 170/70 | HR 72 | Wt 174.5 lb

## 2017-09-10 DIAGNOSIS — I429 Cardiomyopathy, unspecified: Secondary | ICD-10-CM | POA: Insufficient documentation

## 2017-09-10 DIAGNOSIS — E785 Hyperlipidemia, unspecified: Secondary | ICD-10-CM | POA: Diagnosis not present

## 2017-09-10 DIAGNOSIS — J449 Chronic obstructive pulmonary disease, unspecified: Secondary | ICD-10-CM | POA: Insufficient documentation

## 2017-09-10 DIAGNOSIS — F17200 Nicotine dependence, unspecified, uncomplicated: Secondary | ICD-10-CM

## 2017-09-10 DIAGNOSIS — I5022 Chronic systolic (congestive) heart failure: Secondary | ICD-10-CM | POA: Insufficient documentation

## 2017-09-10 DIAGNOSIS — F329 Major depressive disorder, single episode, unspecified: Secondary | ICD-10-CM | POA: Diagnosis not present

## 2017-09-10 DIAGNOSIS — I160 Hypertensive urgency: Secondary | ICD-10-CM

## 2017-09-10 DIAGNOSIS — F17210 Nicotine dependence, cigarettes, uncomplicated: Secondary | ICD-10-CM | POA: Diagnosis not present

## 2017-09-10 DIAGNOSIS — F1010 Alcohol abuse, uncomplicated: Secondary | ICD-10-CM | POA: Diagnosis not present

## 2017-09-10 DIAGNOSIS — Z79899 Other long term (current) drug therapy: Secondary | ICD-10-CM | POA: Insufficient documentation

## 2017-09-10 DIAGNOSIS — Z7982 Long term (current) use of aspirin: Secondary | ICD-10-CM | POA: Diagnosis not present

## 2017-09-10 DIAGNOSIS — I110 Hypertensive heart disease with heart failure: Secondary | ICD-10-CM | POA: Diagnosis not present

## 2017-09-10 MED ORDER — ISOSORB DINITRATE-HYDRALAZINE 20-37.5 MG PO TABS: 2.0000 | ORAL_TABLET | Freq: Three times a day (TID) | ORAL | 5 refills | Status: DC

## 2017-09-10 NOTE — Addendum Note (Signed)
Encounter addended by: Scarlette Calico, RN on: 09/10/2017 11:52 AM  Actions taken: Pharmacy for encounter modified, Order list changed, Diagnosis association updated, Sign clinical note

## 2017-09-10 NOTE — Progress Notes (Signed)
Advanced Heart Failure Clinic Note   Primary Care: Dr. Berline Lopes Primary Cardiologist: Dr. Haroldine Laws   HPI:  Devon Vega is a 62 y.o. 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   Admitted 8/17-8/20/18 with acute dyspnea. Found to have marked hypertension in 180s and tachycardia in 110s. Medications adjusted with symptomatic improvement.  Echo with depressed EF as below.  Cath 03/02/17 with No CAD and low filling pressures.   Admitted 9/25 - 9/26 with dizziness and hypertensive urgency. He had a questionable syncopal episode, but was on EKG via EMS at the time with no arrythmia.   Today he returns for HF follow up. Says he feels good. Getting around fine. Says he is walking at least 1 mile 2-3x/week without any problem. Denies CP or SOB or edema. BP at home 140-150. Taking all meds as prescribed. Was running out of Bidil and went to Pharmacy and put $600 on his credit card. Smoking 10 cigarettes per day. Not drinking alchol.   Echo 02/27/17 LVEF 30-35%, Grade 1 DD, Mod AS, Mild TR, PA peak pressure 28 mm Hg.  Echo 04/08/17 LVEF 35-40%, Mild AI, Mild MR.   [facility] 03/02/17 Ao = 103/53 (75) LV =  121/4 RA = 1 RV = 17/3 PA = 17/5 (10) PCW = 4 Fick cardiac output/index = 3.3/1.9 PVR = 1.9 WU SVR = 1772 FA sat = 93% PA sat = 58%, 58% Assessment:  1. Normal coronary arteries  2. NICM 35%  3. Low filling pressures with moderately to severely depressed cardiac output  Review of systems complete and found to be negative unless listed in HPI.    Past Medical History:  Diagnosis Date  . Alcohol abuse    hx of , sober since 1997  . COPD (chronic obstructive pulmonary disease) ([place])   . Hyperlipidemia   . Hypertension   . 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 Sinus arrhythmia 05/24/2014  . Tobacco abuse    failed zyban  . Weight gain     Current Outpatient Medications  Medication Sig Dispense Refill  .  amLODipine (NORVASC) 10 MG tablet Take 1 tablet (10 mg total) by mouth daily. 30 tablet 6  . aspirin EC 81 MG tablet Take 81 mg by mouth daily.    . carvedilol (COREG) 25 MG tablet Take 1 tablet (25 mg total) by mouth 2 (two) times daily with a meal. 60 tablet 11  . fluticasone (FLONASE) 50 MCG/ACT nasal spray Place 1 spray into both nostrils daily as needed for allergies or rhinitis.    Marland Kitchen isosorbide-hydrALAZINE (BIDIL) 20-37.5 MG tablet Take 2 tablets by mouth 3 (three) times daily. 180 tablet 5  . rosuvastatin (CRESTOR) 10 MG tablet Take 1 tablet (10 mg total) by mouth daily. 90 tablet 3  . sacubitril-valsartan (ENTRESTO) 97-103 MG Take 1 tablet by mouth 2 (two) times daily. 180 tablet 3  . spironolactone (ALDACTONE) 25 MG tablet Take 1 tablet (25 mg total) by mouth daily. 30 tablet 6   No current facility-administered medications for this encounter.    Allergies  Allergen Reactions  . Codeine Cough  . Lisinopril Cough    Social History   Socioeconomic History  . Marital status: Divorced    Spouse name: Not on file  . Number of children: Not on file  . Years of education: Not on file  . Highest education level: Not on file  Social Needs  .  Financial resource strain: Not on file  . Food insecurity - worry: Not on file  . Food insecurity - inability: Not on file  . Transportation needs - medical: Not on file  . Transportation needs - non-medical: Not on file  Occupational History  . Not on file  Tobacco Use  . Smoking status: Former Smoker    Packs/day: 0.75    Types: Cigarettes, Cigars    Last attempt to quit: 03/30/2017    Years since quitting: 0.4  . Smokeless tobacco: Never Used  . Tobacco comment: pt knows he neesd to quit  Substance and Sexual Activity  . Alcohol use: Yes    Alcohol/week: 0.0 oz    Comment: 2 beers/day  . Drug use: Yes    Types: Marijuana  . Sexual activity: Not on file  Other Topics Concern  . 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 Cancer Father   . Renal Disease Brother        On Dialysis  . Heart failure Brother   . Hypertension Brother   . Hypertension Mother    Vitals:   09/10/17 1127  BP: (!) 170/70  Pulse: 72  SpO2: 100%  Weight: 174 lb 8 oz (79.2 kg)   Wt Readings from Last 3 Encounters:  09/10/17 174 lb 8 oz (79.2 kg)  07/15/17 168 lb (76.2 kg)  05/14/17 162 lb (73.5 kg)   PHYSICAL EXAM: General:  Well appearing. No resp difficulty HEENT: normal Neck: supple. no JVD. Carotids 2+ bilat; no bruits. No lymphadenopathy or thryomegaly appreciated. Cor: PMI nondisplaced. Regular rate & rhythm. No rubs, gallops or murmurs. Lungs: clear Abdomen: soft, nontender, nondistended. No hepatosplenomegaly. No bruits or masses. Good bowel sounds. Extremities: no cyanosis, clubbing, rash, edema Neuro: alert & orientedx3, cranial nerves grossly intact. moves all 4 extremities w/o difficulty. Affect pleasant  ASSESSMENT & PLAN: 1. Chronic systolic HF - Echo 1/85/63 LVEF 35-40%, Mild AI, Mild MR.  - R/LHC 03/02/17 with normal coronaries and low filling pressures. -- Possible HTN cardiomyopathy vs ETOH cardiomyopathy. - NYHA I. Volume status stable despite weight gain.  - On goal dose entresto and carvedilol - Despite med list - he is only taking Bidil 1 tab tid. Will increase to 2 tabs tid - Continue spiro 25 mg daily.  - Echo at next visit in 2 months  2. Hypertension - BP still elevated. Will have him keep BP log.  - Increase Bidil as above  3. Tobacco use - encouraged smoking cessation  4. ETOH abuse Not drinking. Congratulated   Glori Bickers, MD 09/10/17

## 2017-09-10 NOTE — Patient Instructions (Addendum)
Increase Bidil to 2 tabs Three times a day   Your physician has requested that you regularly monitor and record your blood pressure readings at home. Please use the same machine at the same time of day to check your readings and record them to bring to your follow-up visit.  Dr Haroldine Laws has recommended that you check it twice a day.  Your physician recommends that you schedule a follow-up appointment in: 2 months with echocardiogram

## 2017-09-10 NOTE — Addendum Note (Signed)
Encounter addended by: Scarlette Calico, RN on: 09/10/2017 11:54 AM  Actions taken: Sign clinical note

## 2017-09-22 ENCOUNTER — Telehealth (HOSPITAL_COMMUNITY): Payer: Self-pay | Admitting: Pharmacist

## 2017-09-22 MED ORDER — HYDRALAZINE HCL 50 MG PO TABS: 75.0000 mg | ORAL_TABLET | Freq: Three times a day (TID) | ORAL | 5 refills | Status: DC

## 2017-09-22 MED ORDER — ISOSORBIDE MONONITRATE ER 120 MG PO TB24: 120.0000 mg | ORAL_TABLET | Freq: Every day | ORAL | 5 refills | Status: DC

## 2017-09-22 NOTE — Telephone Encounter (Signed)
Bidil PA denied on appeal by [facility] commercial. Will switch to separate components of hydralazine

## 2017-10-11 ENCOUNTER — Other Ambulatory Visit (HOSPITAL_COMMUNITY): Payer: Self-pay | Admitting: Student

## 2017-11-11 ENCOUNTER — Encounter (HOSPITAL_COMMUNITY): Payer: Self-pay | Admitting: Internal Medicine

## 2017-11-11 ENCOUNTER — Ambulatory Visit (HOSPITAL_COMMUNITY)
Admission: RE | Admit: 2017-11-11 | Discharge: 2017-11-11 | Disposition: A | Discharge status: Home / Self Care | Payer: BLUE CROSS/BLUE SHIELD | Source: Ambulatory Visit | Attending: Internal Medicine | Admitting: Internal Medicine

## 2017-11-11 ENCOUNTER — Other Ambulatory Visit: Payer: Self-pay

## 2017-11-11 ENCOUNTER — Ambulatory Visit (HOSPITAL_BASED_OUTPATIENT_CLINIC_OR_DEPARTMENT_OTHER)
Admission: RE | Admit: 2017-11-11 | Discharge: 2017-11-11 | Disposition: A | Discharge status: Home / Self Care | Payer: BLUE CROSS/BLUE SHIELD | Source: Ambulatory Visit | Attending: Internal Medicine | Admitting: Internal Medicine

## 2017-11-11 VITALS — BP 145/70 | HR 64 | Wt 167.8 lb

## 2017-11-11 DIAGNOSIS — Z8249 Family history of ischemic heart disease and other diseases of the circulatory system: Secondary | ICD-10-CM | POA: Insufficient documentation

## 2017-11-11 DIAGNOSIS — E785 Hyperlipidemia, unspecified: Secondary | ICD-10-CM | POA: Insufficient documentation

## 2017-11-11 DIAGNOSIS — Z720 Tobacco use: Secondary | ICD-10-CM | POA: Insufficient documentation

## 2017-11-11 DIAGNOSIS — X58XXXA Exposure to other specified factors, initial encounter: Secondary | ICD-10-CM | POA: Diagnosis not present

## 2017-11-11 DIAGNOSIS — I5022 Chronic systolic (congestive) heart failure: Secondary | ICD-10-CM

## 2017-11-11 DIAGNOSIS — I110 Hypertensive heart disease with heart failure: Secondary | ICD-10-CM | POA: Diagnosis not present

## 2017-11-11 DIAGNOSIS — I429 Cardiomyopathy, unspecified: Secondary | ICD-10-CM | POA: Insufficient documentation

## 2017-11-11 DIAGNOSIS — Z7951 Long term (current) use of inhaled steroids: Secondary | ICD-10-CM | POA: Diagnosis not present

## 2017-11-11 DIAGNOSIS — Z7982 Long term (current) use of aspirin: Secondary | ICD-10-CM | POA: Diagnosis not present

## 2017-11-11 DIAGNOSIS — I10 Essential (primary) hypertension: Secondary | ICD-10-CM | POA: Diagnosis not present

## 2017-11-11 DIAGNOSIS — J449 Chronic obstructive pulmonary disease, unspecified: Secondary | ICD-10-CM | POA: Insufficient documentation

## 2017-11-11 DIAGNOSIS — F1010 Alcohol abuse, uncomplicated: Secondary | ICD-10-CM | POA: Insufficient documentation

## 2017-11-11 DIAGNOSIS — Z79899 Other long term (current) drug therapy: Secondary | ICD-10-CM | POA: Diagnosis not present

## 2017-11-11 DIAGNOSIS — N522 Drug-induced erectile dysfunction: Secondary | ICD-10-CM | POA: Insufficient documentation

## 2017-11-11 DIAGNOSIS — T502X5A Adverse effect of carbonic-anhydrase inhibitors, benzothiadiazides and other diuretics, initial encounter: Secondary | ICD-10-CM | POA: Diagnosis not present

## 2017-11-11 DIAGNOSIS — F17200 Nicotine dependence, unspecified, uncomplicated: Secondary | ICD-10-CM | POA: Diagnosis not present

## 2017-11-11 MED ORDER — DOXAZOSIN MESYLATE 1 MG PO TABS: 1.0000 mg | ORAL_TABLET | Freq: Every day | ORAL | 6 refills | Status: DC

## 2017-11-11 NOTE — Patient Instructions (Signed)
Start Doxazosin 1 mg daily  Your physician recommends that you schedule a follow-up appointment in: 4 months

## 2017-11-11 NOTE — Progress Notes (Signed)
Advanced Heart Failure Clinic Note   Primary Care: Dr. Berline Lopes Primary Cardiologist: Dr. Haroldine Laws   HPI:  Devon Vega is a 62 y.o. 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   Admitted 8/17-8/20/18 with acute dyspnea. Found to have marked hypertension in 180s and tachycardia in 110s. Medications adjusted with symptomatic improvement.  Echo with depressed EF as below.  Cath 03/02/17 with No CAD and low filling pressures.   Admitted 9/25 - 9/26 with dizziness and hypertensive urgency. He had a questionable syncopal episode, but was on EKG via EMS at the time with no arrythmia.   Returns today for HF follow up. Last visit bidil was increased to 2 tabs TID. Overall feeling very well. Walking 2x/week. He is also cutting grass 2x/day on riding mower. No SOB, orthopnea, PND, or edema. No CP or dizziness. Able to afford Bidil with coupon he got from the pharmacy (updated med rec). Smoking 10 cigarettes/day. No ETOH. Compliant with meds. Weights at home trending down 170 > 167 lbs. BP cuff is broken. Complaining about ED and asking about ED meds.   Echo  today reviewed personally EF 40-45% moderate AI  Echo 02/27/17 LVEF 30-35%, Grade 1 DD, Mod AS, Mild TR, PA peak pressure 28 mm Hg.  Echo 04/08/17 LVEF 35-40%, Mild AI, Mild MR.  Echo 11/11/17 LVEF 40-45%, mod AI  L/RHC 03/02/17 Ao = 103/53 (75) LV =  121/4 RA = 1 RV = 17/3 PA = 17/5 (10) PCW = 4 Fick cardiac output/index = 3.3/1.9 PVR = 1.9 WU SVR = 1772 FA sat = 93% PA sat = 58%, 58% Assessment:  1. Normal coronary arteries  2. NICM 35%  3. Low filling pressures with moderately to severely depressed cardiac output  Review of systems complete and found to be negative unless listed in HPI.    Past Medical History:  Diagnosis Date  . Alcohol abuse    hx of , sober since 1997  . COPD (chronic obstructive pulmonary disease) ([place])   . Hyperlipidemia   .  Hypertension   . 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 Sinus arrhythmia 05/24/2014  . Tobacco abuse    failed zyban  . Weight gain     Current Outpatient Medications  Medication Sig Dispense Refill  . amLODipine (NORVASC) 10 MG tablet Take 1 tablet (10 mg total) by mouth daily. 30 tablet 6  . aspirin EC 81 MG tablet Take 81 mg by mouth daily.    . carvedilol (COREG) 25 MG tablet Take 1 tablet (25 mg total) by mouth 2 (two) times daily with a meal. 60 tablet 11  . fluticasone (FLONASE) 50 MCG/ACT nasal spray Place 1 spray into both nostrils daily as needed for allergies or rhinitis.    Marland Kitchen isosorbide-hydrALAZINE (BIDIL) 20-37.5 MG tablet Take 2 tablets by mouth 3 (three) times daily.    . rosuvastatin (CRESTOR) 10 MG tablet Take 1 tablet (10 mg total) by mouth daily. 90 tablet 3  . sacubitril-valsartan (ENTRESTO) 97-103 MG Take 1 tablet by mouth 2 (two) times daily. 180 tablet 3  . spironolactone (ALDACTONE) 25 MG tablet TAKE 1 TABLET BY MOUTH ONCE DAILY 30 tablet 3  . doxazosin (CARDURA) 1 MG tablet Take 1 tablet (1 mg total) by mouth daily. 30 tablet 6   No current facility-administered medications for this encounter.    Allergies  Allergen Reactions  . Codeine Cough  . Lisinopril  Cough    Social History   Socioeconomic History  . Marital status: Divorced    Spouse name: Not on file  . Number of children: Not on file  . Years of education: Not on file  . Highest education level: Not on file  Occupational History  . Not on file  Social Needs  . Financial resource strain: Not on file  . Food insecurity:    Worry: Not on file    Inability: Not on file  . Transportation needs:    Medical: Not on file    Non-medical: Not on file  Tobacco Use  . Smoking status: Former Smoker    Packs/day: 0.75    Types: Cigarettes, Cigars    Last attempt to quit: 03/30/2017    Years since quitting: 0.6  . Smokeless tobacco: Never Used  . Tobacco comment: pt knows he neesd to quit    Substance and Sexual Activity  . Alcohol use: Yes    Alcohol/week: 0.0 oz    Comment: 2 beers/day  . Drug use: Yes    Types: Marijuana  . Sexual activity: Not on file  Lifestyle  . Physical activity:    Days per week: Not on file    Minutes per session: Not on file  . Stress: Not on file  Relationships  . Social connections:    Talks on phone: Not on file    Gets together: Not on file    Attends religious service: Not on file    Active member of club or organization: Not on file    Attends meetings of clubs or organizations: Not on file    Relationship status: Not on file  . Intimate partner violence:    Fear of current or ex partner: Not on file    Emotionally abused: Not on file    Physically abused: Not on file    Forced sexual activity: Not on file  Other Topics Concern  . 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 Cancer Father   . Renal Disease Brother        On Dialysis  . Heart failure Brother   . Hypertension Brother   . Hypertension Mother    Vitals:   11/11/17 1144  BP: (!) 145/70  Pulse: 64  SpO2: 100%  Weight: 167 lb 12.8 oz (76.1 kg)   Wt Readings from Last 3 Encounters:  11/11/17 167 lb 12.8 oz (76.1 kg)  09/10/17 174 lb 8 oz (79.2 kg)  07/15/17 168 lb (76.2 kg)   PHYSICAL EXAM: General: Well appearing. No resp difficulty. HEENT: Normal anicteric Neck: Supple. JVP 5-6. Carotids 2+ bilat; no bruits. No thyromegaly or nodule noted. Cor: PMI nondisplaced. RRR, No M/G/R noted Lungs: CTAB, normal effort. No wheeze Abdomen: Soft, non-tender, non-distended, no HSM. No bruits or masses. +BS  Extremities: no cyanosis,  clubbing, rash, edema Neuro: alert & oriented x 3, cranial nerves grossly intact. moves all 4 extremities w/o difficulty. Affect pleasant   ASSESSMENT & PLAN: 1. Chronic systolic HF. NICM - Echo 04/08/17 LVEF 35-40%, Mild AI, Mild MR.  -  R/LHC 03/02/17 with normal coronaries and low filling pressures. -- Possible HTN cardiomyopathy vs ETOH cardiomyopathy. - NYHA I. Volume status stable. Weight is down.  - On goal dose entresto and carvedilol - Continue bidil 2 tab TID - Continue spiro 25 mg daily.  - Echo today: EF 40-45%, mod AI. Reviewed by Dr Haroldine Laws  2. Hypertension - BP cuff not working at home. - Improved with higher dose of Bidil, but still elevated. - Start doxazosin 1 mg daily.   3. Tobacco use - encouraged smoking cessation. No change  4. ETOH abuse - Not drinking. Congratulated  5. ED - He knows he can't take viagra with bidil. Not interested in urology referral for now.   Follow up 4 months Start doxazosin 1 mg daily   Georgiana Shore, NP 11/11/17   Patient seen and examined with the above-signed Advanced Practice Provider and/or Housestaff. I personally reviewed laboratory data, imaging studies and relevant notes. I independently examined the patient and formulated the important aspects of the plan. I have edited the note to reflect any of my changes or salient points. I have personally discussed the plan with the patient and/or family.  Echo reviewed personally EF continues to improve. 40-45%. Stable moderate AI. Volume status looks good. HF meds optimized. BP still high will add doxazosin. Unable to use Viagra due to Bidil. Can discuss other options with Urology.   Glori Bickers, MD  12:37 AM

## 2017-11-11 NOTE — Progress Notes (Signed)
  Echocardiogram 2D Echocardiogram has been performed.  Jennette Dubin 11/11/2017, 11:32 AM

## 2018-02-09 ENCOUNTER — Other Ambulatory Visit (HOSPITAL_COMMUNITY): Payer: Self-pay | Admitting: Internal Medicine

## 2018-02-14 ENCOUNTER — Other Ambulatory Visit (HOSPITAL_COMMUNITY): Payer: Self-pay | Admitting: Adult Health

## 2018-03-12 NOTE — Progress Notes (Signed)
Advanced Heart Failure Clinic Note   Primary Care: Dr. Berline Lopes Primary Cardiologist: Dr. Haroldine Laws   HPI:  Devon Vega is a 62 y.o. 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   Admitted 8/17-8/20/18 with acute dyspnea. Found to have marked hypertension in 180s and tachycardia in 110s. Medications adjusted with symptomatic improvement.  Echo with depressed EF as below.  Cath 03/02/17 with No CAD and low filling pressures.   Admitted 9/25 - 04/08/17 with dizziness and hypertensive urgency. He had a questionable syncopal episode, but was on EKG via EMS at the time with no arrythmia.   He returns today for HF follow up. Last visit started on cardura for elevated BP. Overall doing fine. Denies SOB with any activity. No problems with stairs or inclines. Denies orthopnea, PND, or edema. No dizziness or CP. Taking all medications. Not weighing at home. Occasionally checks BP and SBP runs upper 130s. Continues to smoke 1/2 ppd. No ETOH use.  Echo 02/27/17 LVEF 30-35%, Grade 1 DD, Mod AS, Mild TR, PA peak pressure 28 mm Hg.  Echo 04/08/17 LVEF 35-40%, Mild AI, Mild MR.  Echo 11/11/17 LVEF 40-45%, mod AI  L/RHC 03/02/17 Ao = 103/53 (75) LV =  121/4 RA = 1 RV = 17/3 PA = 17/5 (10) PCW = 4 Fick cardiac output/index = 3.3/1.9 PVR = 1.9 WU SVR = 1772 FA sat = 93% PA sat = 58%, 58% Assessment:  1. Normal coronary arteries  2. NICM 35%  3. Low filling pressures with moderately to severely depressed cardiac output  Review of systems complete and found to be negative unless listed in HPI.   Past Medical History:  Diagnosis Date  . Alcohol abuse    hx of , sober since 1997  . COPD (chronic obstructive pulmonary disease) ([place])   . Hyperlipidemia   . Hypertension   . 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 Sinus arrhythmia 05/24/2014  . Tobacco abuse    failed zyban  . Weight gain     Current Outpatient  Medications  Medication Sig Dispense Refill  . amLODipine (NORVASC) 10 MG tablet TAKE 1 TABLET BY MOUTH ONCE DAILY 30 tablet 5  . aspirin EC 81 MG tablet Take 81 mg by mouth daily.    . carvedilol (COREG) 25 MG tablet Take 1 tablet (25 mg total) by mouth 2 (two) times daily with a meal. 60 tablet 11  . doxazosin (CARDURA) 1 MG tablet Take 1 tablet (1 mg total) by mouth daily. 30 tablet 6  . fluticasone (FLONASE) 50 MCG/ACT nasal spray Place 1 spray into both nostrils daily as needed for allergies or rhinitis.    Marland Kitchen isosorbide-hydrALAZINE (BIDIL) 20-37.5 MG tablet Take 2 tablets by mouth 3 (three) times daily.    . rosuvastatin (CRESTOR) 10 MG tablet Take 1 tablet (10 mg total) by mouth daily. 90 tablet 3  . sacubitril-valsartan (ENTRESTO) 97-103 MG Take 1 tablet by mouth 2 (two) times daily. 180 tablet 3  . spironolactone (ALDACTONE) 25 MG tablet TAKE 1 TABLET BY MOUTH EVERY DAY 30 tablet 5   No current facility-administered medications for this encounter.    Allergies  Allergen Reactions  . Codeine Cough  . Lisinopril Cough    Social History   Socioeconomic History  . Marital status: Divorced    Spouse name: Not on file  . Number of children: Not on file  . Years of education:  Not on file  . Highest education level: Not on file  Occupational History  . Not on file  Social Needs  . Financial resource strain: Not on file  . Food insecurity:    Worry: Not on file    Inability: Not on file  . Transportation needs:    Medical: Not on file    Non-medical: Not on file  Tobacco Use  . Smoking status: Former Smoker    Packs/day: 0.75    Types: Cigarettes, Cigars    Last attempt to quit: 03/30/2017    Years since quitting: 0.9  . Smokeless tobacco: Never Used  . Tobacco comment: pt knows he neesd to quit  Substance and Sexual Activity  . Alcohol use: Yes    Alcohol/week: 0.0 standard drinks    Comment: 2 beers/day  . Drug use: Yes    Types: Marijuana  . Sexual activity: Not on  file  Lifestyle  . Physical activity:    Days per week: Not on file    Minutes per session: Not on file  . Stress: Not on file  Relationships  . Social connections:    Talks on phone: Not on file    Gets together: Not on file    Attends religious service: Not on file    Active member of club or organization: Not on file    Attends meetings of clubs or organizations: Not on file    Relationship status: Not on file  . Intimate partner violence:    Fear of current or ex partner: Not on file    Emotionally abused: Not on file    Physically abused: Not on file    Forced sexual activity: Not on file  Other Topics Concern  . 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 Cancer Father   . Renal Disease Brother        On Dialysis  . Heart failure Brother   . Hypertension Brother   . Hypertension Mother    Vitals:   03/16/18 1120  BP: (!) 144/70  Pulse: 68  SpO2: 98%  Weight: 73.8 kg (162 lb 12.8 oz)   Wt Readings from Last 3 Encounters:  03/16/18 73.8 kg (162 lb 12.8 oz)  11/11/17 76.1 kg (167 lb 12.8 oz)  09/10/17 79.2 kg (174 lb 8 oz)   PHYSICAL EXAM: General: Well appearing. No resp difficulty. HEENT: Normal Neck: Supple. No JVD. Carotids 2+ bilat; no bruits. No thyromegaly or nodule noted. Cor: PMI nondisplaced. RRR, No M/G/R noted Lungs: CTAB, normal effort. Abdomen: Soft, non-tender, non-distended, no HSM. No bruits or masses. +BS  Extremities: No cyanosis, clubbing, or rash. R and LLE no edema.  Neuro: Alert & orientedx3, cranial nerves grossly intact. moves all 4 extremities w/o difficulty. Affect pleasant   ASSESSMENT & PLAN: 1. Chronic  systolic HF. NICM - Echo 04/08/17 LVEF 35-40%, Mild AI, Mild MR.   - Echo 11/11/17: EF 40-45%, mod AI - R/LHC 03/02/17 with normal coronaries and low filling pressures. -- Possible HTN cardiomyopathy vs ETOH cardiomyopathy. - NYHA I.  - Volume status stable.  Does not need diuretics. BMET today.  - Continue entresto 97/106 BID  - Continue carvedilol 25 mg BID - Continue bidil 2 tab TID - Continue spiro 25 mg daily.   2. Hypertension - Elevated today. SBP runs upper 130s at home.  - Increase doxazosin to 2 mg daily.  - Continue amlodipine 10 mg daily  3. Tobacco use - Still smoking 1/2 ppd. Encouraged cessation.   4. ETOH abuse - Not drinking. Congratulated  5. ED - He knows he can't take viagra with bidil. Not interested in urology referral for now.   Increase cardura to 2 mg daily BMET today Follow up 3-4 months [employer] patient assistance renewal application completed.   Georgiana Shore, NP 03/16/18   Greater than 50% of the 25 minute visit was spent in counseling/coordination of care regarding disease state education, salt/fluid restriction, sliding scale diuretics, and medication compliance.

## 2018-03-16 ENCOUNTER — Encounter (HOSPITAL_COMMUNITY): Payer: Self-pay

## 2018-03-16 ENCOUNTER — Ambulatory Visit (HOSPITAL_COMMUNITY)
Admission: RE | Admit: 2018-03-16 | Discharge: 2018-03-16 | Disposition: A | Discharge status: Home / Self Care | Payer: BLUE CROSS/BLUE SHIELD | Source: Ambulatory Visit | Attending: Cardiology | Admitting: Cardiology

## 2018-03-16 VITALS — BP 144/70 | HR 68 | Wt 162.8 lb

## 2018-03-16 DIAGNOSIS — I110 Hypertensive heart disease with heart failure: Secondary | ICD-10-CM | POA: Diagnosis not present

## 2018-03-16 DIAGNOSIS — J449 Chronic obstructive pulmonary disease, unspecified: Secondary | ICD-10-CM | POA: Diagnosis not present

## 2018-03-16 DIAGNOSIS — Z7982 Long term (current) use of aspirin: Secondary | ICD-10-CM | POA: Insufficient documentation

## 2018-03-16 DIAGNOSIS — Z888 Allergy status to other drugs, medicaments and biological substances status: Secondary | ICD-10-CM | POA: Insufficient documentation

## 2018-03-16 DIAGNOSIS — F17200 Nicotine dependence, unspecified, uncomplicated: Secondary | ICD-10-CM

## 2018-03-16 DIAGNOSIS — F17210 Nicotine dependence, cigarettes, uncomplicated: Secondary | ICD-10-CM | POA: Insufficient documentation

## 2018-03-16 DIAGNOSIS — E785 Hyperlipidemia, unspecified: Secondary | ICD-10-CM | POA: Insufficient documentation

## 2018-03-16 DIAGNOSIS — I5022 Chronic systolic (congestive) heart failure: Secondary | ICD-10-CM | POA: Diagnosis not present

## 2018-03-16 DIAGNOSIS — F1010 Alcohol abuse, uncomplicated: Secondary | ICD-10-CM | POA: Diagnosis not present

## 2018-03-16 DIAGNOSIS — I10 Essential (primary) hypertension: Secondary | ICD-10-CM | POA: Diagnosis not present

## 2018-03-16 DIAGNOSIS — Z79899 Other long term (current) drug therapy: Secondary | ICD-10-CM | POA: Diagnosis not present

## 2018-03-16 LAB — BASIC METABOLIC PANEL
Anion gap: 7 (ref 5–15)
BUN: 8 mg/dL (ref 8–23)
CO2: 26 mmol/L (ref 22–32)
Calcium: 9.4 mg/dL (ref 8.9–10.3)
Chloride: 105 mmol/L (ref 98–111)
Creatinine, Ser: 1.09 mg/dL (ref 0.61–1.24)
GFR calc Af Amer: >60 mL/min (ref >60)
GFR calc non Af Amer: >60 mL/min (ref >60)
Glucose, Bld: 128 mg/dL — ABNORMAL HIGH (ref 70–99)
Potassium: 4.1 mmol/L (ref 3.5–5.1)
Sodium: 138 mmol/L (ref 135–145)

## 2018-03-16 MED ORDER — DOXAZOSIN MESYLATE 1 MG PO TABS: 2.0000 mg | ORAL_TABLET | Freq: Every day | ORAL | 6 refills | Status: DC

## 2018-03-16 NOTE — Patient Instructions (Signed)
Labs today (will call for abnormal results, otherwise no news is good news)  INCREASE Cardura 2 mg (2 Tablets) Once daily  Follow up in 3-4 months.

## 2018-03-25 ENCOUNTER — Ambulatory Visit: Payer: BLUE CROSS/BLUE SHIELD | Admitting: Internal Medicine

## 2018-03-25 ENCOUNTER — Encounter: Payer: Self-pay | Admitting: Internal Medicine

## 2018-03-25 ENCOUNTER — Other Ambulatory Visit: Payer: Self-pay

## 2018-03-25 VITALS — BP 128/59 | HR 72 | Temp 98.6°F | Ht 65.0 in | Wt 163.1 lb

## 2018-03-25 DIAGNOSIS — J449 Chronic obstructive pulmonary disease, unspecified: Secondary | ICD-10-CM | POA: Diagnosis not present

## 2018-03-25 DIAGNOSIS — M7918 Myalgia, other site: Secondary | ICD-10-CM

## 2018-03-25 DIAGNOSIS — M5030 Other cervical disc degeneration, unspecified cervical region: Secondary | ICD-10-CM | POA: Diagnosis not present

## 2018-03-25 DIAGNOSIS — E785 Hyperlipidemia, unspecified: Secondary | ICD-10-CM | POA: Diagnosis not present

## 2018-03-25 DIAGNOSIS — Z23 Encounter for immunization: Secondary | ICD-10-CM | POA: Diagnosis not present

## 2018-03-25 DIAGNOSIS — M25511 Pain in right shoulder: Secondary | ICD-10-CM

## 2018-03-25 DIAGNOSIS — I110 Hypertensive heart disease with heart failure: Secondary | ICD-10-CM

## 2018-03-25 DIAGNOSIS — F1010 Alcohol abuse, uncomplicated: Secondary | ICD-10-CM

## 2018-03-25 DIAGNOSIS — Z720 Tobacco use: Secondary | ICD-10-CM

## 2018-03-25 DIAGNOSIS — M791 Myalgia, unspecified site: Secondary | ICD-10-CM

## 2018-03-25 DIAGNOSIS — R252 Cramp and spasm: Secondary | ICD-10-CM | POA: Diagnosis not present

## 2018-03-25 DIAGNOSIS — I5020 Unspecified systolic (congestive) heart failure: Secondary | ICD-10-CM

## 2018-03-25 HISTORY — DX: Pain in right shoulder: M25.511

## 2018-03-25 NOTE — Progress Notes (Addendum)
   CC: Shoulder pain  HPI:  [name] is a 62 y.o. with a history of cervical degenerative disc disease, COPD, HLD, HTN, HFrEF, tobacco abuse, and alcohol abuse who presents with shoulder pain.  Patient reports that he has bilateral shoulder pain and stiffness at night. He states that he goes to bed at night without pain. He will wake up multiple times throughout the night to urinate and will feel the pain. This pain keeps him from getting back to sleep. He describes the shoulder pain and mainly located in his upper biceps and triceps region bilaterally. He wakes up in the morning with muscle stiffness. After about 5 minutes of moving around his pain and stiffness resolves. He has not tried any OTC medications to help with his muscle pain. He denies pain in other muscle groups and joint pain. Denies weakness and changes in sensation. Denies fevers and chills. Denies any recent trauma.  Patient has a history of chronic cervical degenerative disc disease. He reports having left sided neck and arm pain that resolved in 2012 after an unknown injection.    Past Medical History:  Diagnosis Date  . Alcohol abuse    hx of , sober since 1997  . COPD (chronic obstructive pulmonary disease) ([place])   . Hyperlipidemia   . Hypertension   . 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 Sinus arrhythmia 05/24/2014  . Tobacco abuse    failed zyban  . Weight gain    Review of Systems:  Review of Systems  Constitutional: Negative for chills and fever.  Musculoskeletal: Negative for back pain, falls, joint pain and neck pain.       Bilateral upper extremity pain (mainly in his biceps and triceps)  Neurological: Negative for sensory change and weakness.  All other systems reviewed and are negative.   Physical Exam:  Vitals:   03/25/18 1514  BP: (!) 128/59  Pulse: 72  Temp: 98.6 F (37 C)  TempSrc: Oral  SpO2: 100%  Weight: 163 lb 1.6 oz (74 kg)  Height: 5\' 5"  (1.651 m)    Physical Exam  Constitutional: He appears well-developed and well-nourished.  HENT:  Head: Normocephalic and atraumatic.  Eyes: EOM are normal.  Neck: Normal range of motion.  No tenderness to palpation of the neck.  Cardiovascular: Normal rate, regular rhythm and normal heart sounds.  Pulmonary/Chest: Effort normal and breath sounds normal.  Musculoskeletal: Normal range of motion.  No tenderness to palpation of upper extremities bilaterally.  Neurological:  5/5 strength of upper and lower extremities bilaterally. No change in sensation of UE's and LE's.   Skin: Skin is warm and dry.  Psychiatric: He has a normal mood and affect. His behavior is normal.  Vitals reviewed.   Assessment & Plan:   See Encounters Tab for problem based charting.  Patient seen with Dr. Rebeca Alert

## 2018-03-25 NOTE — Assessment & Plan Note (Signed)
Assessment: [name] presents with a 2 week history of muscle pain and stiffness only at night. Differential diagnosis includes statin-induced myopathy and hypothyroidism. I less concerned for an inflammatory myopathy given he does not have the weakness and tenderness to palpation that is usually associated with these conditions. Additionally, his muscle stiffness only lasts for minutes in the morning which is not typical for an inflammatory myopathy. Metabolic abnormalities should also be considered but this is much less likely since he had a normal BMP 1 week ago.   Plan: 1. Instructed patient to take two 200 mg Ibuprofen tablets after dinner.  2. Ordered CK and TSH today. 3. If his symptoms worsen I will consider checking an ESR in the future.

## 2018-03-25 NOTE — Patient Instructions (Signed)
Please take two 200 mg Advil (Ibuprofen) tablets after dinner. I will call you with the results of your blood work today.

## 2018-03-26 LAB — TSH: TSH: 1.65 u[IU]/mL (ref 0.450–4.500)

## 2018-03-26 LAB — CK: Total CK: 272 U/L — ABNORMAL HIGH (ref 24–204)

## 2018-03-31 ENCOUNTER — Telehealth: Payer: Self-pay | Admitting: Internal Medicine

## 2018-03-31 NOTE — Telephone Encounter (Signed)
It would be best for him to be seen in [facility] to have the shoulder pain evaluated.

## 2018-03-31 NOTE — Telephone Encounter (Signed)
Pt is stating he is still having pain in shoulder, would like to know if the physician can prescribe him some pain medicine, please [place]; pt contact# [phone]

## 2018-04-01 NOTE — Telephone Encounter (Signed)
Talked to pt - informed he needs to an appt to be re-eval per his PCP . Call transferred to front office - appt scheduled in [facility] for tomorrow.

## 2018-04-02 ENCOUNTER — Other Ambulatory Visit: Payer: Self-pay

## 2018-04-02 ENCOUNTER — Ambulatory Visit (INDEPENDENT_AMBULATORY_CARE_PROVIDER_SITE_OTHER): Payer: BLUE CROSS/BLUE SHIELD | Admitting: Internal Medicine

## 2018-04-02 ENCOUNTER — Telehealth (HOSPITAL_COMMUNITY): Payer: Self-pay | Admitting: Pharmacist

## 2018-04-02 DIAGNOSIS — I10 Essential (primary) hypertension: Secondary | ICD-10-CM | POA: Diagnosis not present

## 2018-04-02 DIAGNOSIS — M25511 Pain in right shoulder: Secondary | ICD-10-CM | POA: Diagnosis not present

## 2018-04-02 DIAGNOSIS — Z79899 Other long term (current) drug therapy: Secondary | ICD-10-CM | POA: Diagnosis not present

## 2018-04-02 NOTE — Progress Notes (Signed)
   CC: F/u of shoulder pain  HPI:  [name] is a 62 y.o. with PMHx as below, came in to the clinic today for follow up of his shoulder pain. Please see encounter based assessment and plan for details.  Past Medical History:  Diagnosis Date  . Alcohol abuse    hx of , sober since 1997  . COPD (chronic obstructive pulmonary disease) ([place])   . Hyperlipidemia   . Hypertension   . 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 Sinus arrhythmia 05/24/2014  . Tobacco abuse    failed zyban  . Weight gain    Family Hx: HTN    Mother HTN    Brother  Social Hx: Current smoker. 0.5 pack per day x 40 years No alcohol No drug  Review of Systems:  Review of Systems  Constitutional: Negative for chills, fever and malaise/fatigue.  Respiratory: Negative for shortness of breath.   Cardiovascular: Negative for chest pain, palpitations, leg swelling and PND.  Gastrointestinal: Negative for abdominal pain, blood in stool, constipation and diarrhea.  Musculoskeletal: Positive for joint pain.  Skin: Negative for rash.  Neurological: Negative for tingling, sensory change, focal weakness and weakness.    Physical Exam:  Vitals:   04/02/18 1409  BP: 138/65  Pulse: 60  Temp: 98.4 F (36.9 C)  TempSrc: Oral  SpO2: 100%  Weight: 165 lb 8 oz (75.1 kg)  Height: 5\' 5"  (1.651 m)   Physical Exam  Constitutional: He appears well-developed and well-nourished. No distress.  HENT:  Head: Normocephalic and atraumatic.  Eyes: EOM are normal.  Cardiovascular: Normal rate, regular rhythm and normal heart sounds.  No murmur heard. Pulmonary/Chest: Effort normal and breath sounds normal. No respiratory distress. He has no wheezes. He has no rales.  Abdominal: Soft. Bowel sounds are normal. He exhibits no distension. There is no tenderness.  Musculoskeletal:  No deformities, no rash. Passive and active ROM are full but brings some pain in full abduction. He exhibits no edema, tenderness  or deformity.  Neurological: No sensory deficit. He exhibits normal muscle tone.  Skin: Skin is warm and dry.  Psychiatric: He has a normal mood and affect.    Assessment & Plan:   See Encounters Tab for problem based charting.  Patient seen with Dr. Angelia Mould

## 2018-04-02 NOTE — Patient Instructions (Addendum)
It was our pleasure taking care of you in our clinic today. You were seen due to right shoulder pain and stiffness.  Your shoulder pain and exam is suggestive of mild rotator cuff tendonitis. Your pain has not been completely improved with Ibuprofen since last visit. A we discussed and per your agreement, we injected steroid at your shoulder to help with pain and inflammation. Please avoid using heavy activity on your shoulder. Follow up with your primary care as cheduled and return in clinic if your pain not improved in 2 -4 weeks.  Please do not hesitate to contact us if you have any question or concern.  Thank you Dr. Myrtie Hawk

## 2018-04-02 NOTE — Progress Notes (Signed)
Shoulder Injection Procedure Note  Pre-operative Diagnosis: right shoulder  Indications: Rotator cuff tendinitis  Anesthesia: not required   Procedure Details   Consent was obtained for the procedure. The shoulder was prepped with iodine. Using a 24 gauge needle the subacromial bursa was injected with 2 mL 1% lidocaine and 1 mL of triamcinolone (KENALOG) 40mg /ml. The needle was removed and a dressing was applied.  Complications:  None; patient tolerated the procedure well.  Supervised by Dr. Heber Austell

## 2018-04-02 NOTE — Telephone Encounter (Signed)
Entresto 97-103 mg BID PA approved by [employer] commercial through 07/13/38.   [name] Velva Harman, PharmD, BCPS, CPP Clinical Pharmacist Phone: [phone] 04/02/2018 10:33 AM

## 2018-04-02 NOTE — Assessment & Plan Note (Addendum)
Patient came back to clinic today for shoulder pain follow up. His pain has not been improved with 400 mg bed time Ibuprofen. Reports that he wakes up in middle of night due with dull pain in proximal of right arm and stiffness in his right shoulder. Also reports some stiffness at AM for 15-20 min. 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 No chest pain. No SOB. No intolerance to activity. On exam, no deformities, no rash. Passive and active ROM are full but brings some pain in full abduction.  No muscle tenderness. TSH and CK was checked on last visit. TSH is normal. CK is slightly elevated that has been a chronic finding and considering african american ethnicity can be acceptable.   His shoulder pain likely to be rotator cuff tendinitis. Has not improved to Ibuprofen. We offered shoulder steroid injection. Risk and benefits of injection explained to the patient and he agreed to get the injection today.  -Triamcinolone injection to right shoulder -Avoid heavy activity on right shoulder -Follow up with PCP as scheduled for 2 weeks or return to clinic earlier if pain not improved or worsened

## 2018-04-02 NOTE — Assessment & Plan Note (Signed)
Patient has been compliant to his medications. BP today is 138/65.  Asymptomatic Currently on amlodipine 10 mg daily, carvedilol 25 mg twice daily, Entresto 97-1 3 twice daily, spironolactone 25 mg daily and BiDil 20-30 7.5, 2 tablets 3 times daily -Continue current dose of BP medications

## 2018-04-05 NOTE — Progress Notes (Signed)
Internal Medicine Clinic Attending  I saw and evaluated the patient.  I personally confirmed the key portions of the history and exam documented by Dr. Masoudi and I reviewed pertinent patient test results.  The assessment, diagnosis, and plan were formulated together and I agree with the documentation in the resident's note.   I was present for the entire procedure.  

## 2018-04-06 NOTE — Progress Notes (Signed)
Internal Medicine Clinic Attending  I saw and evaluated the patient.  I personally confirmed the key portions of the history and exam documented by Dr. Prince and I reviewed pertinent patient test results.  The assessment, diagnosis, and plan were formulated together and I agree with the documentation in the resident's note.  Alexander Raines, M.D., Ph.D.  

## 2018-04-14 ENCOUNTER — Other Ambulatory Visit (HOSPITAL_COMMUNITY): Payer: Self-pay | Admitting: Internal Medicine

## 2018-04-14 DIAGNOSIS — E7849 Other hyperlipidemia: Secondary | ICD-10-CM

## 2018-04-21 ENCOUNTER — Other Ambulatory Visit: Payer: Self-pay

## 2018-04-21 ENCOUNTER — Encounter: Payer: Self-pay | Admitting: Internal Medicine

## 2018-04-21 ENCOUNTER — Ambulatory Visit: Payer: BLUE CROSS/BLUE SHIELD | Admitting: Internal Medicine

## 2018-04-21 VITALS — BP 144/62 | HR 64 | Temp 98.9°F | Wt 164.1 lb

## 2018-04-21 DIAGNOSIS — I10 Essential (primary) hypertension: Secondary | ICD-10-CM

## 2018-04-21 DIAGNOSIS — I5022 Chronic systolic (congestive) heart failure: Secondary | ICD-10-CM | POA: Diagnosis not present

## 2018-04-21 MED ORDER — CARVEDILOL 25 MG PO TABS: 25.0000 mg | ORAL_TABLET | Freq: Two times a day (BID) | ORAL | 3 refills | Status: DC

## 2018-04-21 NOTE — Progress Notes (Signed)
Case discussed with Dr. Harbrecht at the time of the visit. We reviewed the resident's history and exam and pertinent patient test results. I agree with the assessment, diagnosis, and plan of care documented in the resident's note 

## 2018-04-21 NOTE — Assessment & Plan Note (Addendum)
  HFrEF: Patient has a documented history of heart failure with reduced ejection fraction.  Most recent LVEF of 40-45% noted in May 2019 along with diffuse hypokinesis and G1DD. Patient in currently prescribed Carvedilol 25mg  daily, doxazosin 2mg  daily, BIDIL 20-37.5mg  TID, spironolactone 25mg  daily and Entresto 97-103 BID.  Today he denied dyspnea, orthopnea, pedal edema, or generalized fatigue.  Plan: Continue above regimen  Refilled carvedilol 25 mg twice daily

## 2018-04-21 NOTE — Patient Instructions (Addendum)
FOLLOW-UP INSTRUCTIONS When: 6 months For: Routine visit What to bring: All of your medications   I have not made any changes to your medications today. Please feel free to call us with any questions at any time.   Im glad to see that your shoulder is feeling better. Please continue to avoid activity that initially caused the pain.  Thank you for your visit to the [facility] today.

## 2018-04-21 NOTE — Progress Notes (Signed)
   CC: Routine for HTN, HFrEF  HPI:[name] is a 62 y.o. male who presents for evaluation of her HTN and HFrEF. Please see individual problem based A/P for details.  HTN: Patient BP slightly elevated today at 144/62.  Patient denied nausea, vomiting, headache, palpations, chest pain, abdominal pain, shoulder pain, dyspnea, or claudication.  Given his current presentation, prior similar blood pressure recordings and given that he had ambulated a significant distance prior to arriving to the [facility] today I feel it is appropriate to not make any changes to his blood pressure regimen.   Plan: Continue Amlodipine Continue Entresto  Continue Spironolactone  Continue BIDIL  HFrEF: Patient has a documented history of heart failure with reduced ejection fraction.  Most recent LVEF of 40-45% noted in May 2019 along with diffuse hypokinesis and G1DD. Patient in currently prescribed Carvedilol 25mg  daily, doxazosin 2mg  daily, BIDIL 20-37.5mg  TID, spironolactone 25mg  daily and Entresto 97-103 BID.  Today he denied dyspnea, orthopnea, pedal edema, or generalized fatigue.  Plan: Continue above regimen  Refilled carvedilol 25 mg twice daily  The patient had received his influenza vaccination in September.  The patient like to defer his hepatitis C screening later date  PHQ-9: Based on the patients score of 0 we have decided to monitor him.  Past Medical History:  Diagnosis Date  . Alcohol abuse    hx of , sober since 1997  . COPD (chronic obstructive pulmonary disease) ([place])   . Hyperlipidemia   . Hypertension   . 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 Sinus arrhythmia 05/24/2014  . Tobacco abuse    failed zyban  . Weight gain    Review of Systems: ROS negative except as per HPI  Physical Exam: Vitals:   04/21/18 1445  BP: (!) 144/62  Pulse: 64  Temp: 98.9 F (37.2 C)  TempSrc: Oral  SpO2: 100%  Weight: 164 lb 1.6 oz (74.4 kg)   General: A/O x4, no acute  distress, afebrile, nondiaphoretic, appears comfortable at rest Cardio: RRR, no murmurs rubs or gallops auscultated, no JVD approximately 6 to 7 cm above the clavicles Pulmonary: Lungs are clear to auscultation bilaterally no wheezing rhonchi MSK: Bilateral lower extremities are nontender, nonedematous  Assessment & Plan:   See Encounters Tab for problem based charting.  Patient discussed with Dr. Eppie Gibson

## 2018-04-21 NOTE — Assessment & Plan Note (Signed)
  HTN: Patient BP slightly elevated today at 144/62.  Patient denied nausea, vomiting, headache, palpations, chest pain, abdominal pain, shoulder pain, dyspnea, or claudication.  Given his current presentation, prior similar blood pressure recordings and given that he had ambulated a significant distance prior to arriving to the [facility] today I feel it is appropriate to not make any changes to his blood pressure regimen.  Plan: Continue Amlodipine Continue Entresto  Continue Spironolactone  Continue BIDIL

## 2018-06-07 ENCOUNTER — Telehealth (HOSPITAL_COMMUNITY): Payer: Self-pay | Admitting: Pharmacist

## 2018-06-07 MED ORDER — SACUBITRIL-VALSARTAN 97-103 MG PO TABS: 1.0000 | ORAL_TABLET | Freq: Two times a day (BID) | ORAL | 5 refills | Status: DC

## 2018-06-07 NOTE — Telephone Encounter (Signed)
Mr. Abdallah called stating that his [employer] patient assistance expired in September. I have advised him that he is no longer eligible for patient assistance through the company since he has [employer] now. I will mail him the $10 copay card in case his copay is >$10/mo.   [name] Velva Harman, PharmD, BCPS, CPP Clinical Pharmacist Phone: [phone] 06/07/2018 1:52 PM

## 2018-06-14 ENCOUNTER — Other Ambulatory Visit (HOSPITAL_COMMUNITY): Payer: Self-pay | Admitting: Internal Medicine

## 2018-06-22 ENCOUNTER — Ambulatory Visit (HOSPITAL_COMMUNITY)
Admission: RE | Admit: 2018-06-22 | Discharge: 2018-06-22 | Disposition: A | Discharge status: Home / Self Care | Payer: BLUE CROSS/BLUE SHIELD | Source: Ambulatory Visit | Attending: Internal Medicine | Admitting: Internal Medicine

## 2018-06-22 ENCOUNTER — Encounter (HOSPITAL_COMMUNITY): Payer: Self-pay

## 2018-06-22 VITALS — BP 148/78 | HR 63 | Wt 165.8 lb

## 2018-06-22 DIAGNOSIS — F17290 Nicotine dependence, other tobacco product, uncomplicated: Secondary | ICD-10-CM | POA: Insufficient documentation

## 2018-06-22 DIAGNOSIS — I5022 Chronic systolic (congestive) heart failure: Secondary | ICD-10-CM

## 2018-06-22 DIAGNOSIS — E785 Hyperlipidemia, unspecified: Secondary | ICD-10-CM

## 2018-06-22 DIAGNOSIS — J449 Chronic obstructive pulmonary disease, unspecified: Secondary | ICD-10-CM | POA: Insufficient documentation

## 2018-06-22 DIAGNOSIS — I110 Hypertensive heart disease with heart failure: Secondary | ICD-10-CM | POA: Insufficient documentation

## 2018-06-22 DIAGNOSIS — Z7951 Long term (current) use of inhaled steroids: Secondary | ICD-10-CM | POA: Insufficient documentation

## 2018-06-22 DIAGNOSIS — Z79899 Other long term (current) drug therapy: Secondary | ICD-10-CM | POA: Insufficient documentation

## 2018-06-22 DIAGNOSIS — I428 Other cardiomyopathies: Secondary | ICD-10-CM | POA: Diagnosis not present

## 2018-06-22 DIAGNOSIS — F17200 Nicotine dependence, unspecified, uncomplicated: Secondary | ICD-10-CM

## 2018-06-22 DIAGNOSIS — Z8249 Family history of ischemic heart disease and other diseases of the circulatory system: Secondary | ICD-10-CM | POA: Diagnosis not present

## 2018-06-22 DIAGNOSIS — M25511 Pain in right shoulder: Secondary | ICD-10-CM | POA: Diagnosis not present

## 2018-06-22 DIAGNOSIS — Z7982 Long term (current) use of aspirin: Secondary | ICD-10-CM | POA: Insufficient documentation

## 2018-06-22 DIAGNOSIS — N529 Male erectile dysfunction, unspecified: Secondary | ICD-10-CM | POA: Diagnosis not present

## 2018-06-22 DIAGNOSIS — F17210 Nicotine dependence, cigarettes, uncomplicated: Secondary | ICD-10-CM | POA: Insufficient documentation

## 2018-06-22 DIAGNOSIS — F1010 Alcohol abuse, uncomplicated: Secondary | ICD-10-CM | POA: Diagnosis not present

## 2018-06-22 DIAGNOSIS — I10 Essential (primary) hypertension: Secondary | ICD-10-CM

## 2018-06-22 LAB — BASIC METABOLIC PANEL
Anion gap: 9 (ref 5–15)
BUN: 6 mg/dL — ABNORMAL LOW (ref 8–23)
CO2: 25 mmol/L (ref 22–32)
Calcium: 9.4 mg/dL (ref 8.9–10.3)
Chloride: 106 mmol/L (ref 98–111)
Creatinine, Ser: 1.02 mg/dL (ref 0.61–1.24)
GFR calc Af Amer: >60 mL/min (ref >60)
GFR calc non Af Amer: >60 mL/min (ref >60)
Glucose, Bld: 117 mg/dL — ABNORMAL HIGH (ref 70–99)
Potassium: 3.9 mmol/L (ref 3.5–5.1)
Sodium: 140 mmol/L (ref 135–145)

## 2018-06-22 MED ORDER — ISOSORBIDE MONONITRATE ER 30 MG PO TB24: 30.0000 mg | ORAL_TABLET | Freq: Every day | ORAL | 3 refills | Status: DC

## 2018-06-22 MED ORDER — HYDRALAZINE HCL 100 MG PO TABS: 100.0000 mg | ORAL_TABLET | Freq: Three times a day (TID) | ORAL | 3 refills | Status: DC

## 2018-06-22 NOTE — Patient Instructions (Signed)
Labs done today  STOP Bidil  START Hydralazine 100mg  (1 tab) three times daily  START Imdur 30mg  (1 tab) daily  Follow up with Dr. Aundra Dubin in 6 months

## 2018-06-22 NOTE — Progress Notes (Signed)
Advanced Heart Failure Clinic Note   Primary Care: Dr. Berline Lopes Primary Cardiologist: Dr. Haroldine Laws   HPI:  Devon Vega is a 62 y.o. 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   Admitted 8/17-8/20/18 with acute dyspnea. Found to have marked hypertension in 180s and tachycardia in 110s. Medications adjusted with symptomatic improvement.  Echo with depressed EF as below.  Cath 03/02/17 with No CAD and low filling pressures.   Admitted 9/25 - 04/08/17 with dizziness and hypertensive urgency. He had a questionable syncopal episode, but was on EKG via EMS at the time with no arrythmia.   He returns today for HF follow up. Last visit cardura was increased. He is paying out of pocket for Bidil, and right now runs him upward of $75 a month. Feeling great otherwise. He denies orthopnea, PND, edema, lightheadedness or chest pain. Still struggling with erectile dysfunction but understands he cant take viagra on imdur. He is going to call for a Urology referral. BP running 120-130s at home. Trying to cut back on smoking. Denies ETOH use.   Echo 02/27/17 LVEF 30-35%, Grade 1 DD, Mod AS, Mild TR, PA peak pressure 28 mm Hg.  Echo 04/08/17 LVEF 35-40%, Mild AI, Mild MR.  Echo 11/11/17 LVEF 40-45%, mod AI  L/RHC 03/02/17 Ao = 103/53 (75) LV =  121/4 RA = 1 RV = 17/3 PA = 17/5 (10) PCW = 4 Fick cardiac output/index = 3.3/1.9 PVR = 1.9 WU SVR = 1772 FA sat = 93% PA sat = 58%, 58% Assessment:  1. Normal coronary arteries  2. NICM 35%  3. Low filling pressures with moderately to severely depressed cardiac output  Review of systems complete and found to be negative unless listed in HPI.    Past Medical History:  Diagnosis Date  . Alcohol abuse    hx of , sober since 1997  . Chronic cough 03/10/2017  . COPD (chronic obstructive pulmonary disease) ([place])   . Hyperlipidemia   . Hypertension   . Hypertensive urgency 04/15/2017  .  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 Right shoulder pain 03/25/2018  . Sinus arrhythmia 05/24/2014  . Tobacco abuse    failed zyban  . Weight gain     Current Outpatient Medications  Medication Sig Dispense Refill  . amLODipine (NORVASC) 10 MG tablet TAKE 1 TABLET BY MOUTH ONCE DAILY 30 tablet 5  . aspirin EC 81 MG tablet Take 81 mg by mouth daily.    . carvedilol (COREG) 25 MG tablet Take 1 tablet (25 mg total) by mouth 2 (two) times daily with a meal. 180 tablet 3  . doxazosin (CARDURA) 1 MG tablet Take 2 tablets (2 mg total) by mouth daily. 60 tablet 6  . fluticasone (FLONASE) 50 MCG/ACT nasal spray Place 1 spray into both nostrils daily as needed for allergies or rhinitis.    Marland Kitchen isosorbide-hydrALAZINE (BIDIL) 20-37.5 MG tablet Take 2 tablets by mouth 3 (three) times daily.    . rosuvastatin (CRESTOR) 10 MG tablet TAKE 1 TABLET BY MOUTH ONCE DAILY 90 tablet 0  . sacubitril-valsartan (ENTRESTO) 97-103 MG Take 1 tablet by mouth 2 (two) times daily. 60 tablet 5  . spironolactone (ALDACTONE) 25 MG tablet TAKE 1 TABLET BY MOUTH EVERY DAY 30 tablet 0   No current facility-administered medications for this encounter.    Allergies  Allergen Reactions  . Codeine Cough  . Lisinopril Cough  Social History   Socioeconomic History  . Marital status: Divorced    Spouse name: Not on file  . Number of children: Not on file  . Years of education: Not on file  . Highest education level: Not on file  Occupational History  . Not on file  Social Needs  . Financial resource strain: Not on file  . Food insecurity:    Worry: Not on file    Inability: Not on file  . Transportation needs:    Medical: Not on file    Non-medical: Not on file  Tobacco Use  . Smoking status: Current Every Day Smoker    Packs/day: 0.50    Types: Cigarettes, Cigars    Last attempt to quit: 03/30/2017    Years since quitting: 1.2  . Smokeless tobacco: Never Used  . Tobacco comment: trying to quit    Substance and Sexual Activity  . Alcohol use: Yes    Alcohol/week: 0.0 standard drinks    Comment: 2 beers/day  . Drug use: Yes    Types: Marijuana  . Sexual activity: Not on file  Lifestyle  . Physical activity:    Days per week: Not on file    Minutes per session: Not on file  . Stress: Not on file  Relationships  . Social connections:    Talks on phone: Not on file    Gets together: Not on file    Attends religious service: Not on file    Active member of club or organization: Not on file    Attends meetings of clubs or organizations: Not on file    Relationship status: Not on file  . Intimate partner violence:    Fear of current or ex partner: Not on file    Emotionally abused: Not on file    Physically abused: Not on file    Forced sexual activity: Not on file  Other Topics Concern  . Not on file  Social History Narrative   Works for [employer]. 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 Cancer Father   . Renal Disease Brother        On Dialysis  . Heart failure Brother   . Hypertension Brother   . Hypertension Mother    Vitals:   06/22/18 1159  BP: (!) 148/78  Pulse: 63  SpO2: 98%  Weight: 75.2 kg (165 lb 12.8 oz)     Wt Readings from Last 3 Encounters:  06/22/18 75.2 kg (165 lb 12.8 oz)  04/21/18 74.4 kg (164 lb 1.6 oz)  04/02/18 75.1 kg (165 lb 8 oz)   PHYSICAL EXAM: General: Well appearing. No resp difficulty. HEENT: Normal Neck: Supple. JVP 5-6. Carotids 2+ bilat; no bruits. No thyromegaly or nodule noted. Cor: PMI nondisplaced. RRR, No M/G/R noted Lungs: CTAB, normal effort. Abdomen: Soft, non-tender, non-distended, no HSM. No bruits or masses. +BS  Extremities: No cyanosis,  clubbing, or rash. R and LLE no edema.  Neuro: Alert & orientedx3, cranial nerves grossly intact. moves all 4 extremities w/o difficulty. Affect pleasant   ASSESSMENT & PLAN: 1. Chronic systolic HF. NICM - Echo 04/08/17 LVEF 35-40%, Mild AI, Mild MR.   -  Echo 11/11/17: EF 40-45%, mod AI - R/LHC 03/02/17 with normal coronaries and low filling pressures. -- Possible HTN cardiomyopathy vs ETOH cardiomyopathy. - NYHA I.  - Volume status stable on exam.. Does not need diuretics. BMET today - Continue entresto 97/106 BID  - Continue carvedilol 25 mg BID - He can no longer afford bidil. Will change to Hydralazine at 100 mg TID and Imdur 30 mg daily. Pt to follow BP at home and call if running > 140. - Continue spiro 25 mg daily.   2. Hypertension - Will not increase meds below with change and relative increase to Hydralazine.  - Continue doxazosin 2 mg daily.  - Continue amlodipine 10 mg daily  3. Tobacco use - Encouraged complete cessation.   4. ETOH abuse - Not drinking. Congratulated. No change.   5. ED - He knows he can't take viagra with bidil. Not interested in urology referral for now. No change.   RTC 6 months with MD. Sooner with symptoms.   Shirley Friar, PA-C 06/22/18   Greater than 50% of the 25 minute visit was spent in counseling/coordination of care regarding disease state education, salt/fluid restriction, sliding scale diuretics, and medication compliance.

## 2018-07-19 ENCOUNTER — Telehealth (HOSPITAL_COMMUNITY): Payer: Self-pay

## 2018-07-19 ENCOUNTER — Other Ambulatory Visit (HOSPITAL_COMMUNITY): Payer: Self-pay

## 2018-07-19 NOTE — Telephone Encounter (Signed)
PA for BiDil 20-37.5mg  sent to [facility] for [employer]

## 2018-07-20 ENCOUNTER — Encounter (HOSPITAL_COMMUNITY): Payer: Self-pay | Admitting: Cardiology

## 2018-07-20 NOTE — Telephone Encounter (Signed)
Original request denied- appeals letter written and faxed to [facility] FAX# [phone]

## 2018-07-26 ENCOUNTER — Other Ambulatory Visit (HOSPITAL_COMMUNITY): Payer: Self-pay

## 2018-07-27 ENCOUNTER — Other Ambulatory Visit (HOSPITAL_COMMUNITY): Payer: Self-pay

## 2018-07-27 NOTE — Telephone Encounter (Signed)
Opened chart in error.

## 2018-07-30 ENCOUNTER — Telehealth (HOSPITAL_COMMUNITY): Payer: Self-pay | Admitting: Cardiology

## 2018-07-30 NOTE — Telephone Encounter (Signed)
PA started via [facility] for bidil

## 2018-08-06 NOTE — Telephone Encounter (Signed)
PA approved via [facility] 07/30/18-07/31/19 auth faxed to patients pharmacy

## 2018-09-07 ENCOUNTER — Other Ambulatory Visit (HOSPITAL_COMMUNITY): Payer: Self-pay | Admitting: Internal Medicine

## 2018-09-13 ENCOUNTER — Other Ambulatory Visit (HOSPITAL_COMMUNITY): Payer: Self-pay

## 2018-09-13 MED ORDER — AMLODIPINE BESYLATE 10 MG PO TABS: 10.0000 mg | ORAL_TABLET | Freq: Every day | ORAL | 5 refills | Status: DC

## 2018-10-05 ENCOUNTER — Other Ambulatory Visit (HOSPITAL_COMMUNITY): Payer: Self-pay | Admitting: Internal Medicine

## 2018-10-06 ENCOUNTER — Telehealth (INDEPENDENT_AMBULATORY_CARE_PROVIDER_SITE_OTHER): Payer: Medicare Other | Admitting: Internal Medicine

## 2018-10-06 ENCOUNTER — Other Ambulatory Visit: Payer: Self-pay

## 2018-10-06 ENCOUNTER — Encounter: Payer: Self-pay | Admitting: Internal Medicine

## 2018-10-06 DIAGNOSIS — I10 Essential (primary) hypertension: Secondary | ICD-10-CM | POA: Diagnosis not present

## 2018-10-06 DIAGNOSIS — I5022 Chronic systolic (congestive) heart failure: Secondary | ICD-10-CM | POA: Diagnosis present

## 2018-10-06 NOTE — Progress Notes (Signed)
Internal Medicine Clinic Attending  Case discussed with Dr. Berline Lopes.  We reviewed the resident's history and exam and pertinent patient test results.  I agree with the assessment, diagnosis, and plan of care documented in the resident's note.

## 2018-10-06 NOTE — Assessment & Plan Note (Signed)
HFrEF: Denied dyspnea, pedal edema, cough, chest pain, headache. He has remained adherent to his regimen. Most recent renal function on 06/22/2018 stable with a potassium of 3.9 and Scr of 1.02.   Plan: Continue Entresto, spiro, Bidil  Consider repeat BMP at next visit

## 2018-10-06 NOTE — Assessment & Plan Note (Signed)
Hypertension: Patient's BP average has been 145/72 over the past several days at home. The patient endorses adherence to his medication regimen. He denied, chest pain, headache, visual changes, lightheadedness, weakness, dizziness on standing, swelling in the feet or ankles.   Plan: Continue Amlodipine 10mg  daily Continue Entresto 97-103 BID Continue Spironolactone 25 mg Daily Continue BIDIL

## 2018-10-06 NOTE — Progress Notes (Signed)
   This is a telephone encounter between Devon Vega and Devon Vega on 10/06/2018 for a routine visit. The visit was conducted with the patient located at home and Devon Vega at [facility]. The patient's identity was confirmed using their DOB and current address. The patient has consented to being evaluated through a telephone encounter and understands the associated risks/benefits. I personally spent 12 min on medical discussion.   CC: CC visit for HTN and HFrEF  HPI:[name] is a 63 y.o. male who was called for evaluation of HTN and HFrEF. Please see individual problem based A/P for details. The patient stated that overall he felt well, denied acute concerns and has been staying home to avoid the recent viral illness.   Hypertension: Patient's BP average has been 145/72 over the past several days at home. The patient endorses adherence to his medication regimen. He denied, chest pain, headache, visual changes, lightheadedness, weakness, dizziness on standing, swelling in the feet or ankles.   Plan: Continue Amlodipine 10mg  daily Continue Entresto 97-103 BID Continue Spironolactone 25 mg Daily Continue BIDIL  HFrEF: Denied dyspnea, pedal edema, cough, chest pain, headache. He has remained adherent to his regimen. Most recent renal function on 06/22/2018 stable with a potassium of 3.9 and Scr of 1.02.   Plan: Continue Entresto, spiro, Bidil  Consider repeat BMP at next visit  Past Medical History:  Diagnosis Date  . Alcohol abuse    hx of , sober since 1997  . Chronic cough 03/10/2017  . COPD (chronic obstructive pulmonary disease) ([place])   . Hyperlipidemia   . Hypertension   . Hypertensive urgency 04/15/2017  . 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 Right shoulder pain 03/25/2018  . Sinus arrhythmia 05/24/2014  . Tobacco abuse    failed zyban  . Weight gain    Review of Systems:   ROS negative except as per HPI.  Assessment & Plan:   See  Encounters Tab for problem based charting.  Patient discussed with Dr. Evette Doffing

## 2018-10-08 ENCOUNTER — Other Ambulatory Visit (HOSPITAL_COMMUNITY): Payer: Self-pay | Admitting: Internal Medicine

## 2018-10-08 DIAGNOSIS — E7849 Other hyperlipidemia: Secondary | ICD-10-CM

## 2018-11-23 ENCOUNTER — Other Ambulatory Visit: Payer: Self-pay

## 2018-11-23 ENCOUNTER — Ambulatory Visit (HOSPITAL_COMMUNITY)
Admission: RE | Admit: 2018-11-23 | Discharge: 2018-11-23 | Disposition: A | Discharge status: Home / Self Care | Payer: BLUE CROSS/BLUE SHIELD | Source: Ambulatory Visit | Attending: Cardiology | Admitting: Cardiology

## 2018-11-23 ENCOUNTER — Encounter (HOSPITAL_COMMUNITY): Payer: Self-pay

## 2018-11-23 ENCOUNTER — Encounter (HOSPITAL_COMMUNITY): Payer: BLUE CROSS/BLUE SHIELD | Admitting: Cardiology

## 2018-11-23 VITALS — Wt 165.0 lb

## 2018-11-23 DIAGNOSIS — I110 Hypertensive heart disease with heart failure: Secondary | ICD-10-CM

## 2018-11-23 DIAGNOSIS — I5022 Chronic systolic (congestive) heart failure: Secondary | ICD-10-CM

## 2018-11-23 DIAGNOSIS — F17200 Nicotine dependence, unspecified, uncomplicated: Secondary | ICD-10-CM

## 2018-11-23 DIAGNOSIS — F17210 Nicotine dependence, cigarettes, uncomplicated: Secondary | ICD-10-CM

## 2018-11-23 NOTE — Progress Notes (Signed)
Heart Failure TeleHealth Note  Due to national recommendations of social distancing due to [place] 19, Audio/video telehealth visit is felt to be most appropriate for this patient at this time.  See MyChart message from today for patient consent regarding telehealth for [facility].  Date:  11/23/2018   ID:  Devon Vega, DOB 07/29/1955, MRN [id number]  Location: Home  Provider location: [place] Advanced Heart Failure Type of Visit: Established patient   PCP:  Kathi Ludwig, MD  Cardiologist:  No primary care provider on file. Primary HF: Dr Haroldine Laws   Chief Complaint: Heart Failure   History of Present Illness: Devon Vega is a 63 y.o. male with a history of with systolic CHF due to NICM, poorly controlled HTN, HLD, COPD, tobacco abuse, and alcohol abuse.   Admitted 8/17-8/20/18 with acute dyspnea. Found to have marked hypertension in 180s and tachycardia in 110s. Medications adjusted with symptomatic improvement.  Echo with depressed EF as below.  Cath 03/02/17 with No CAD and low filling pressures.   Admitted 9/25 - 04/08/17 with dizziness and hypertensive urgency. He had a questionable syncopal episode, but was on EKG via EMS at the time with no arrythmia.   Most recent ECHO 11/11/2017 which showed EF improving 40-45% and mod AI.   He presents via Psychiatric nurse for a telehealth visit today.   Overall feeling fine. Denies SOB/PND/Orthopnea. Appetite ok. No fever or chills. Weight at home 165 pounds. Taking all medications. Smoking 1/2 PPD    he denies symptoms worrisome for COVID 19.   Past Medical History:  Diagnosis Date  . Alcohol abuse    hx of , sober since 1997  . CHF (congestive heart failure) ([place]) 03/02/2017  . Chronic cough 03/10/2017  . COPD (chronic obstructive pulmonary disease) ([place])   . Hyperlipidemia   . Hypertension   . Hypertensive urgency 04/15/2017  . 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 Right shoulder pain 03/25/2018   . Sinus arrhythmia 05/24/2014  . Tobacco abuse    failed zyban  . Weight gain    Past Surgical History:  Procedure Laterality Date  . COLONOSCOPY    . EXCISION METACARPAL MASS Right 02/03/2013   Procedure: WIDE RESECTION OF GRANULAR CELL TUMOR;  Surgeon: Cammie Sickle., MD;  Location: [place];  Service: Orthopedics;  Laterality: Right;  . LYMPH NODE BIOPSY  2011   lt axilla-negative  . MASS EXCISION Right 01/21/2013   Procedure: BIOPSY OF MASS RIGHT RING FINGER;  Surgeon: Cammie Sickle., MD;  Location: [place];  Service: Orthopedics;  Laterality: Right;  . RIGHT/LEFT HEART CATH AND CORONARY ANGIOGRAPHY N/A 03/02/2017   Procedure: RIGHT/LEFT HEART CATH AND CORONARY ANGIOGRAPHY;  Surgeon: Jolaine Artist, MD;  Location: [place] [facility];  Service: Cardiovascular;  Laterality: N/A;  . SKIN FULL THICKNESS GRAFT Right 02/03/2013   Procedure: SKIN GRAFT FULL THICKNESS RIGHT RING FINGER;  Surgeon: Cammie Sickle., MD;  Location: [place];  Service: Orthopedics;  Laterality: Right;     Current Outpatient Medications  Medication Sig Dispense Refill  . amLODipine (NORVASC) 10 MG tablet Take 1 tablet (10 mg total) by mouth daily. 30 tablet 5  . aspirin EC 81 MG tablet Take 81 mg by mouth daily.    Marland Kitchen doxazosin (CARDURA) 1 MG tablet Take 2 tablets (2 mg total) by mouth daily. 60 tablet 6  . fluticasone (FLONASE) 50 MCG/ACT nasal  spray Place 1 spray into both nostrils daily as needed for allergies or rhinitis.    . hydrALAZINE (APRESOLINE) 100 MG tablet Take 1 tablet (100 mg total) by mouth 3 (three) times daily. 90 tablet 3  . isosorbide mononitrate (IMDUR) 30 MG 24 hr tablet Take 1 tablet (30 mg total) by mouth daily. 30 tablet 3  . rosuvastatin (CRESTOR) 10 MG tablet TAKE 1 TABLET BY MOUTH ONCE DAILY 90 tablet 0  . sacubitril-valsartan (ENTRESTO) 97-103 MG Take 1 tablet by mouth 2 (two) times daily. 60 tablet 5  .  spironolactone (ALDACTONE) 25 MG tablet TAKE 1 TABLET BY MOUTH EVERY DAY 90 tablet 0  . carvedilol (COREG) 25 MG tablet Take 1 tablet (25 mg total) by mouth 2 (two) times daily with a meal. 180 tablet 3   No current facility-administered medications for this encounter.     Allergies:   Codeine and Lisinopril   Social History:  The patient  reports that he has been smoking cigarettes and cigars. He has been smoking about 0.50 packs per day. He has never used smokeless tobacco. He reports current alcohol use. He reports current drug use. Drug: Marijuana.   Family History:  The patient's family history includes Cancer in his father; Heart failure in his brother; Hypertension in his brother and mother; Renal Disease in his brother.   ROS:  Please see the history of present illness.   All other systems are personally reviewed and negative.   Exam: Tele Health Call; Exam is subjective General:  Speaks in full sentences. No resp difficulty. Lungs: Normal respiratory effort with conversation.  Abdomen: Non-distended per patient report Extremities: Pt denies edema. Neuro: Alert & oriented x 3.   Recent Labs: 03/25/2018: TSH 1.650 06/22/2018: BUN 6; Creatinine, Ser 1.02; Potassium 3.9; Sodium 140  Personally reviewed   Wt Readings from Last 3 Encounters:  11/23/18 74.8 kg (165 lb)  06/22/18 75.2 kg (165 lb 12.8 oz)  04/21/18 74.4 kg (164 lb 1.6 oz)      ASSESSMENT AND PLAN:  1. Chronic systolic HF. NICM-.   - Echo 11/11/17: EF 40-45%, mod AI - R/LHC 03/02/17 with normal coronaries and low filling pressures. -- Possible HTN cardiomyopathy vs ETOH cardiomyopathy. - - Echo 11/11/17: EF 40-45%, mod AI. Repeat ECHO next visit.  NYHA I. Volume status sound stable.  - On goal dose carvedilol and entresto.  -  Will change to Hydralazine at 100 mg TID and Imdur 30 mg daily.  - Continue spiro 25 mg daily.   2. Hypertension Continue current regimen.   -3. Tobacco use Discussed smoking  cessation.   4. ETOH abuse He has quit.  - 5. ED - He knows he can't take viagra with bidil. Not interested in urology referral for now. No change.    COVID screen The patient does not have any symptoms that suggest any further testing/ screening at this time.  Social distancing reinforced today.  Patient Risk: After full review of this patients clinical status, I feel that they are at moderate risk for cardiac decompensation at this time.  Relevant cardiac medications were reviewed at length with the patient today. The patient does not have concerns regarding their medications at this time.   The following changes were made today: No changes.  Recommended follow-up: Follow up in 3-4 month with an ECHO and Dr Haroldine Laws. Plan to check BMET at that time.   Today, I have spent 11  minutes with the patient with telehealth technology discussing  the above issues .    Jeanmarie Hubert, NP  11/23/2018 11:56 AM  [place] [address] [facility] [place] [address] [phone] (office) [phone] (fax)

## 2018-11-23 NOTE — Patient Instructions (Signed)
Your physician recommends that you schedule a follow-up appointment in: 3-4 months with echocardiogram with Dr Haroldine Laws, our office will call you to schedule this appointment  If you have any questions or concerns before your next appointment please send [place] a message through [facility] or call our office at [phone].

## 2018-11-23 NOTE — Addendum Note (Signed)
Encounter addended by: Scarlette Calico, RN on: 11/23/2018 1:00 PM  Actions taken: Order list changed, Diagnosis association updated, Clinical Note Signed

## 2018-11-23 NOTE — Progress Notes (Signed)
AVS mailed to pt, message sent to schedulers to arrange follow up appointment and echo.

## 2018-12-08 ENCOUNTER — Ambulatory Visit (INDEPENDENT_AMBULATORY_CARE_PROVIDER_SITE_OTHER): Payer: Medicare Other | Admitting: Internal Medicine

## 2018-12-08 ENCOUNTER — Other Ambulatory Visit: Payer: Self-pay

## 2018-12-08 DIAGNOSIS — M79602 Pain in left arm: Secondary | ICD-10-CM

## 2018-12-08 DIAGNOSIS — T148XXA Other injury of unspecified body region, initial encounter: Secondary | ICD-10-CM

## 2018-12-08 NOTE — Patient Instructions (Addendum)
Thank you for coming to the clinic today. It was a pleasure to see you.   I believe your morning arm numbness is from sleeping on top of your arm. Please try your best to sleep on your back or right side and avoid resting your head on the left arm. Drinking alcohol before bed can put you at increased risk of this injury. If your pain is not improved after two weeks please schedule an appointment to follow up in the clinic.   Please call the internal medicine center clinic if you have any questions or concerns, we may be able to help and keep you from a long and expensive emergency room wait. Our clinic and after hours phone number is [phone], the best time to call is Monday through Friday 9 am to 4 pm but there is always someone available 24/7 if you have an emergency. If you need medication refills please notify your pharmacy one week in advance and they will send [place] a request.

## 2018-12-08 NOTE — Progress Notes (Signed)
   CC: left arm muscle pain  HPI:  [name] is a 63 y.o. with PMH as listed below who presents for left arm muscle spasm . Please see the assessment and plans for the status of the patient chronic medical problems.   Past Medical History:  Diagnosis Date  . Alcohol abuse    hx of , sober since 1997  . CHF (congestive heart failure) ([place]) 03/02/2017  . Chronic cough 03/10/2017  . COPD (chronic obstructive pulmonary disease) ([place])   . Hyperlipidemia   . Hypertension   . Hypertensive urgency 04/15/2017  . 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 Right shoulder pain 03/25/2018  . Sinus arrhythmia 05/24/2014  . Tobacco abuse    failed zyban  . Weight gain    Review of Systems: Refer to history of present illness and assessment and plans for pertinent review of systems, all others reviewed and negative  Physical Exam:  Vitals:   12/08/18 1532  BP: (!) 167/75  Pulse: 67  Temp: 98.8 F (37.1 C)  TempSrc: Oral  SpO2: 100%  Weight: 162 lb 6.4 oz (73.7 kg)  Height: 5\' 5"  (1.651 m)   General: well appearing, no acute distress Eyes: no conjunctivitis, no jaundice  Cardiac: regular rate and rhythm, no murmurs, rubs, or gallops, no peripheral edema  Pulm: lungs are clear to auscultation, no wheezes or rhonchi  GI: the abdomen is soft, non tender, non distended  Skin: no rashes evident on the exposed skin   MSK: 5/5 strength in bl upper extremities, normal ROM, no tenderness with palpation of the arm, shoulder, or elbow   Assessment & Plan:   Left arm obdormition  Patient presents for evaluation of left arm pain. The pain is in the upper part of his arm. It has been affecting him every morning when he first wakes up for the past four days. The pain is made worse when he sleeps on top of the arm. It aches first thing in the morning then resolves. He denies elbow pain, arm weakness, headache, change in vision, jaw pain, neck pain. Confirms he does have a couple of beers  before bed   Assessment: left arm mechanical nerve compression while sleeping   - recommended sleeping on back and not leaning on arm  - recommended reduced etoh use  - discussed return precautions for if the discomfort does not continue to improve   See Encounters Tab for problem based charting.  Patient discussed with Dr. Angelia Mould

## 2018-12-09 ENCOUNTER — Encounter: Payer: Self-pay | Admitting: Internal Medicine

## 2018-12-09 DIAGNOSIS — T148XXA Other injury of unspecified body region, initial encounter: Secondary | ICD-10-CM

## 2018-12-09 HISTORY — DX: Other injury of unspecified body region, initial encounter: T14.8XXA

## 2018-12-09 NOTE — Assessment & Plan Note (Signed)
Patient presents for evaluation of left arm pain. The pain is in the upper part of his arm. It has been affecting him every morning when he first wakes up for the past four days. The pain is made worse when he sleeps on top of the arm. It aches first thing in the morning then resolves. He denies elbow pain, arm weakness, headache, change in vision, jaw pain, neck pain. Confirms he does have a couple of beers before bed   Assessment: left arm mechanical nerve compression while sleeping   - recommended sleeping on back and not leaning on arm  - recommended reduced etoh use  - discussed return precautions for if the discomfort does not continue to improve

## 2018-12-14 NOTE — Progress Notes (Signed)
Internal Medicine Clinic Attending  Case discussed with Dr. Blum at the time of the visit.  We reviewed the resident's history and exam and pertinent patient test results.  I agree with the assessment, diagnosis, and plan of care documented in the resident's note. 

## 2019-01-07 ENCOUNTER — Other Ambulatory Visit (HOSPITAL_COMMUNITY): Payer: Self-pay | Admitting: Cardiology

## 2019-01-07 ENCOUNTER — Other Ambulatory Visit (HOSPITAL_COMMUNITY): Payer: Self-pay | Admitting: Internal Medicine

## 2019-01-07 DIAGNOSIS — E7849 Other hyperlipidemia: Secondary | ICD-10-CM

## 2019-01-10 ENCOUNTER — Other Ambulatory Visit (HOSPITAL_COMMUNITY): Payer: Self-pay | Admitting: Student

## 2019-02-07 ENCOUNTER — Other Ambulatory Visit (HOSPITAL_COMMUNITY): Payer: Self-pay | Admitting: Student

## 2019-02-23 ENCOUNTER — Other Ambulatory Visit (HOSPITAL_COMMUNITY): Payer: Self-pay

## 2019-02-23 MED ORDER — DOXAZOSIN MESYLATE 1 MG PO TABS: 2.0000 mg | ORAL_TABLET | Freq: Every day | ORAL | 6 refills | Status: DC

## 2019-02-28 ENCOUNTER — Other Ambulatory Visit (HOSPITAL_COMMUNITY): Payer: Self-pay | Admitting: Internal Medicine

## 2019-03-28 ENCOUNTER — Other Ambulatory Visit: Payer: Self-pay

## 2019-03-28 ENCOUNTER — Encounter (HOSPITAL_COMMUNITY): Payer: Self-pay | Admitting: Internal Medicine

## 2019-03-28 ENCOUNTER — Ambulatory Visit (HOSPITAL_COMMUNITY)
Admission: RE | Admit: 2019-03-28 | Discharge: 2019-03-28 | Disposition: A | Discharge status: Home / Self Care | Payer: Medicare Other | Source: Ambulatory Visit | Attending: Cardiology | Admitting: Cardiology

## 2019-03-28 ENCOUNTER — Ambulatory Visit (HOSPITAL_BASED_OUTPATIENT_CLINIC_OR_DEPARTMENT_OTHER)
Admission: RE | Admit: 2019-03-28 | Discharge: 2019-03-28 | Disposition: A | Discharge status: Home / Self Care | Payer: Medicare Other | Source: Ambulatory Visit | Attending: Internal Medicine | Admitting: Internal Medicine

## 2019-03-28 VITALS — BP 130/66 | HR 67 | Wt 162.1 lb

## 2019-03-28 DIAGNOSIS — I110 Hypertensive heart disease with heart failure: Secondary | ICD-10-CM | POA: Diagnosis not present

## 2019-03-28 DIAGNOSIS — I5022 Chronic systolic (congestive) heart failure: Secondary | ICD-10-CM | POA: Diagnosis present

## 2019-03-28 DIAGNOSIS — I10 Essential (primary) hypertension: Secondary | ICD-10-CM

## 2019-03-28 DIAGNOSIS — F17290 Nicotine dependence, other tobacco product, uncomplicated: Secondary | ICD-10-CM | POA: Diagnosis not present

## 2019-03-28 DIAGNOSIS — E785 Hyperlipidemia, unspecified: Secondary | ICD-10-CM | POA: Insufficient documentation

## 2019-03-28 DIAGNOSIS — J449 Chronic obstructive pulmonary disease, unspecified: Secondary | ICD-10-CM | POA: Insufficient documentation

## 2019-03-28 DIAGNOSIS — Z79899 Other long term (current) drug therapy: Secondary | ICD-10-CM | POA: Diagnosis not present

## 2019-03-28 DIAGNOSIS — F17200 Nicotine dependence, unspecified, uncomplicated: Secondary | ICD-10-CM | POA: Diagnosis not present

## 2019-03-28 DIAGNOSIS — F1010 Alcohol abuse, uncomplicated: Secondary | ICD-10-CM | POA: Diagnosis not present

## 2019-03-28 DIAGNOSIS — F17210 Nicotine dependence, cigarettes, uncomplicated: Secondary | ICD-10-CM | POA: Diagnosis not present

## 2019-03-28 DIAGNOSIS — Z885 Allergy status to narcotic agent status: Secondary | ICD-10-CM | POA: Insufficient documentation

## 2019-03-28 DIAGNOSIS — Z8249 Family history of ischemic heart disease and other diseases of the circulatory system: Secondary | ICD-10-CM | POA: Insufficient documentation

## 2019-03-28 DIAGNOSIS — Z888 Allergy status to other drugs, medicaments and biological substances status: Secondary | ICD-10-CM | POA: Insufficient documentation

## 2019-03-28 DIAGNOSIS — I428 Other cardiomyopathies: Secondary | ICD-10-CM | POA: Diagnosis not present

## 2019-03-28 DIAGNOSIS — Z841 Family history of disorders of kidney and ureter: Secondary | ICD-10-CM | POA: Diagnosis not present

## 2019-03-28 DIAGNOSIS — Z7982 Long term (current) use of aspirin: Secondary | ICD-10-CM | POA: Insufficient documentation

## 2019-03-28 LAB — BASIC METABOLIC PANEL
Anion gap: 8 (ref 5–15)
BUN: 6 mg/dL — ABNORMAL LOW (ref 8–23)
CO2: 25 mmol/L (ref 22–32)
Calcium: 9.7 mg/dL (ref 8.9–10.3)
Chloride: 107 mmol/L (ref 98–111)
Creatinine, Ser: 0.86 mg/dL (ref 0.61–1.24)
GFR calc Af Amer: >60 mL/min (ref >60)
GFR calc non Af Amer: >60 mL/min (ref >60)
Glucose, Bld: 113 mg/dL — ABNORMAL HIGH (ref 70–99)
Potassium: 4.1 mmol/L (ref 3.5–5.1)
Sodium: 140 mmol/L (ref 135–145)

## 2019-03-28 LAB — CBC
HCT: 42.8 % (ref 39.0–52.0)
Hemoglobin: 13.9 g/dL (ref 13.0–17.0)
MCH: 28.5 pg (ref 26.0–34.0)
MCHC: 32.5 g/dL (ref 30.0–36.0)
MCV: 87.7 fL (ref 80.0–100.0)
Platelets: 272 10*3/uL (ref 150–400)
RBC: 4.88 MIL/uL (ref 4.22–5.81)
RDW: 16.6 % — ABNORMAL HIGH (ref 11.5–15.5)
WBC: 7.3 10*3/uL (ref 4.0–10.5)
nRBC: 0 % (ref 0.0–0.2)

## 2019-03-28 NOTE — Addendum Note (Signed)
Encounter addended by: Marlise Eves, RN on: 03/28/2019 2:35 PM  Actions taken: Order list changed, Diagnosis association updated

## 2019-03-28 NOTE — Addendum Note (Signed)
Encounter addended by: Marlise Eves, RN on: 03/28/2019 2:32 PM  Actions taken: Order list changed, Diagnosis association updated, Charge Capture section accepted, Clinical Note Signed

## 2019-03-28 NOTE — Progress Notes (Signed)
Echocardiogram 2D Echocardiogram has been performed.  Devon Vega 03/28/2019, 1:27 PM

## 2019-03-28 NOTE — Addendum Note (Signed)
Encounter addended by: Marlise Eves, RN on: 03/28/2019 2:43 PM  Actions taken: Order list changed

## 2019-03-28 NOTE — Patient Instructions (Signed)
Lab work done today. We will notify you of any abnormal lab work. No news is good news!  Please follow up with [facility].  Please follow up with the [facility] as needed. No need to schedule to an appointment with [place] at this time.  At the [facility], you and your health needs are our priority. As part of our continuing mission to provide you with exceptional heart care, we have created designated Provider Care Teams. These Care Teams include your primary Cardiologist (physician) and Advanced Practice Providers (APPs- Physician Assistants and Nurse Practitioners) who all work together to provide you with the care you need, when you need it.   You may see any of the following providers on your designated Care Team at your next follow up: Marland Kitchen Dr Glori Bickers . Dr Loralie Champagne . Darrick Grinder, NP   Please be sure to bring in all your medications bottles to every appointment.

## 2019-03-28 NOTE — Progress Notes (Signed)
Advanced Heart Failure Clinic Note    Date:  03/28/2019   ID:  Devon Vega, DOB 09-05-1955, MRN [id number]  Location: Home  Provider location: [facility] Type of Visit: Established patient   PCP:  Kathi Ludwig, MD  Cardiologist:  No primary care provider on file. Primary HF: Dr Haroldine Laws   Chief Complaint: Heart Failure   History of Present Illness: Devon Vega is a 63 y.o. male with a history of with systolic CHF due to NICM, poorly controlled HTN, HLD, COPD, tobacco abuse, and alcohol abuse.   Admitted 8/17-8/20/18 with acute dyspnea. Found to have marked hypertension in 180s and tachycardia in 110s. Medications adjusted with symptomatic improvement.  Echo with depressed EF as below.  Cath 03/02/17 with No CAD and low filling pressures.   Admitted 9/25 - 04/08/17 with dizziness and hypertensive urgency. He had a questionable syncopal episode, but was on EKG via EMS at the time with no arrythmia.   Most recent ECHO 11/11/2017 which showed EF improving 40-45% and mod AI.   He presents for routine f/u. Feels great. Mowing grass. No CP, SOB, orthopnea or PND. Overall feeling fine. Weight stable. Taking all medications. Smoking 1/2 PPD. SBP in 130 range at home. Only drinking 1 beer per week.    Echo today EF 60-65%  Mild to moderate AI Personally reviewed   Past Medical History:  Diagnosis Date  . Alcohol abuse    hx of , sober since 1997  . CHF (congestive heart failure) ([place]) 03/02/2017  . Chronic cough 03/10/2017  . COPD (chronic obstructive pulmonary disease) ([place])   . Hyperlipidemia   . Hypertension   . Hypertensive urgency 04/15/2017  . 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 Right shoulder pain 03/25/2018  . Sinus arrhythmia 05/24/2014  . Tobacco abuse    failed zyban  . Weight gain    Past Surgical History:  Procedure Laterality Date  . COLONOSCOPY    . EXCISION METACARPAL MASS Right 02/03/2013   Procedure: WIDE  RESECTION OF GRANULAR CELL TUMOR;  Surgeon: Cammie Sickle., MD;  Location: [place];  Service: Orthopedics;  Laterality: Right;  . LYMPH NODE BIOPSY  2011   lt axilla-negative  . MASS EXCISION Right 01/21/2013   Procedure: BIOPSY OF MASS RIGHT RING FINGER;  Surgeon: Cammie Sickle., MD;  Location: [place];  Service: Orthopedics;  Laterality: Right;  . RIGHT/LEFT HEART CATH AND CORONARY ANGIOGRAPHY N/A 03/02/2017   Procedure: RIGHT/LEFT HEART CATH AND CORONARY ANGIOGRAPHY;  Surgeon: Jolaine Artist, MD;  Location: [place] [facility];  Service: Cardiovascular;  Laterality: N/A;  . SKIN FULL THICKNESS GRAFT Right 02/03/2013   Procedure: SKIN GRAFT FULL THICKNESS RIGHT RING FINGER;  Surgeon: Cammie Sickle., MD;  Location: [place];  Service: Orthopedics;  Laterality: Right;     Current Outpatient Medications  Medication Sig Dispense Refill  . amLODipine (NORVASC) 10 MG tablet TAKE 1 TABLET(10 MG) BY MOUTH DAILY 30 tablet 5  . aspirin EC 81 MG tablet Take 81 mg by mouth daily.    . carvedilol (COREG) 25 MG tablet Take 1 tablet (25 mg total) by mouth 2 (two) times daily with a meal. 180 tablet 3  . doxazosin (CARDURA) 1 MG tablet Take 2 tablets (2 mg total) by mouth daily. 60 tablet 6  . ENTRESTO 97-103 MG TAKE 1 TABLET BY MOUTH TWO TIMES DAILY 60  tablet 5  . fluticasone (FLONASE) 50 MCG/ACT nasal spray Place 1 spray into both nostrils daily as needed for allergies or rhinitis.    . hydrALAZINE (APRESOLINE) 100 MG tablet Take 1 tablet (100 mg total) by mouth 3 (three) times daily. 90 tablet 3  . isosorbide mononitrate (IMDUR) 30 MG 24 hr tablet TAKE 1 TABLET BY MOUTH EVERY DAY 30 tablet 3  . rosuvastatin (CRESTOR) 10 MG tablet TAKE 1 TABLET BY MOUTH EVERY DAY 90 tablet 0  . spironolactone (ALDACTONE) 25 MG tablet TAKE 1 TABLET BY MOUTH EVERY DAY 90 tablet 0   No current facility-administered medications for this encounter.      Allergies:   Codeine and Lisinopril   Social History:  The patient  reports that he has been smoking cigarettes and cigars. He has been smoking about 0.50 packs per day. He has never used smokeless tobacco. He reports current alcohol use. He reports current drug use. Drug: Marijuana.   Family History:  The patient's family history includes Cancer in his father; Heart failure in his brother; Hypertension in his brother and mother; Renal Disease in his brother.   ROS:  Please see the history of present illness.   All other systems are personally reviewed and negative.   Exam:  General:  Well appearing. No resp difficulty HEENT: normal Neck: supple. no JVD. Carotids 2+ bilat; no bruits. No lymphadenopathy or thryomegaly appreciated. Cor: PMI nondisplaced. Regular rate & rhythm. No rubs, gallops or murmurs. Lungs: clear Abdomen: soft, nontender, nondistended. No hepatosplenomegaly. No bruits or masses. Good bowel sounds. Extremities: no cyanosis, clubbing, rash, edema Neuro: alert & orientedx3, cranial nerves grossly intact. moves all 4 extremities w/o difficulty. Affect pleasant   Recent Labs: 06/22/2018: BUN 6; Creatinine, Ser 1.02; Potassium 3.9; Sodium 140  Personally reviewed   Wt Readings from Last 3 Encounters:  03/28/19 73.5 kg (162 lb 2 oz)  12/08/18 73.7 kg (162 lb 6.4 oz)  11/23/18 74.8 kg (165 lb)      ASSESSMENT AND PLAN:  1. Chronic systolic HF. NICM-.suspect HTN CM  - Echo 11/11/17: EF 40-45%, mod AI - R/LHC 03/02/17 with normal coronaries and low filling pressures.  - Echo 11/11/17: EF 40-45%, mod AI. - Echo 03/28/19: EF 60-65% mild-mod AI - NYHA I. Volume status sound stable.  - On goal dose carvedilol and entresto.  - Continue Hydralazine at 100 mg TID and Imdur 30 mg daily.  - Continue spiro 25 mg daily.  - Check labs today  2. Hypertension - BP much improved. Can increase cardura as needed  -3. Tobacco use -Discussed smoking cessation.   4. ETOH abuse -  he has essentially quit. Drinks one [place] per week  Can graduate HF Clinic. F/u with [facility].   Signed, Glori Bickers, MD  03/28/2019 2:18 PM  [facility] [address] [facility] [place] [address] [phone] (office) [phone] (fax)

## 2019-04-07 ENCOUNTER — Other Ambulatory Visit (HOSPITAL_COMMUNITY): Payer: Self-pay | Admitting: Cardiology

## 2019-04-07 DIAGNOSIS — E7849 Other hyperlipidemia: Secondary | ICD-10-CM

## 2019-04-11 ENCOUNTER — Other Ambulatory Visit: Payer: Self-pay | Admitting: Internal Medicine

## 2019-04-11 MED ORDER — SPIRONOLACTONE 25 MG PO TABS: 25.0000 mg | ORAL_TABLET | Freq: Every day | ORAL | 1 refills | Status: DC

## 2019-04-11 NOTE — Telephone Encounter (Signed)
Refill Request  spironolactone (ALDACTONE) 25 MG tablet  [id number] - [place], [place]

## 2019-04-12 ENCOUNTER — Other Ambulatory Visit (HOSPITAL_COMMUNITY): Payer: Self-pay

## 2019-04-12 MED ORDER — SPIRONOLACTONE 25 MG PO TABS: 25.0000 mg | ORAL_TABLET | Freq: Every day | ORAL | 1 refills | Status: DC

## 2019-05-20 ENCOUNTER — Other Ambulatory Visit (HOSPITAL_COMMUNITY): Payer: Self-pay

## 2019-05-20 ENCOUNTER — Other Ambulatory Visit: Payer: Self-pay | Admitting: Internal Medicine

## 2019-05-20 DIAGNOSIS — I5022 Chronic systolic (congestive) heart failure: Secondary | ICD-10-CM

## 2019-05-20 MED ORDER — ISOSORBIDE MONONITRATE ER 30 MG PO TB24: 30.0000 mg | ORAL_TABLET | Freq: Every day | ORAL | 3 refills | Status: DC

## 2019-05-20 NOTE — Telephone Encounter (Signed)
Next appt scheduled 11/16 with PCP.

## 2019-05-22 ENCOUNTER — Other Ambulatory Visit (HOSPITAL_COMMUNITY): Payer: Self-pay | Admitting: Student

## 2019-05-30 ENCOUNTER — Ambulatory Visit (INDEPENDENT_AMBULATORY_CARE_PROVIDER_SITE_OTHER): Payer: Medicare Other | Admitting: Internal Medicine

## 2019-05-30 ENCOUNTER — Other Ambulatory Visit: Payer: Self-pay

## 2019-05-30 ENCOUNTER — Encounter: Payer: Self-pay | Admitting: Internal Medicine

## 2019-05-30 VITALS — BP 121/60 | HR 70 | Temp 98.3°F | Ht 65.0 in | Wt 165.8 lb

## 2019-05-30 DIAGNOSIS — I110 Hypertensive heart disease with heart failure: Secondary | ICD-10-CM | POA: Diagnosis not present

## 2019-05-30 DIAGNOSIS — I351 Nonrheumatic aortic (valve) insufficiency: Secondary | ICD-10-CM | POA: Diagnosis not present

## 2019-05-30 DIAGNOSIS — Z1211 Encounter for screening for malignant neoplasm of colon: Secondary | ICD-10-CM

## 2019-05-30 DIAGNOSIS — Z23 Encounter for immunization: Secondary | ICD-10-CM | POA: Diagnosis present

## 2019-05-30 DIAGNOSIS — E785 Hyperlipidemia, unspecified: Secondary | ICD-10-CM

## 2019-05-30 DIAGNOSIS — I5020 Unspecified systolic (congestive) heart failure: Secondary | ICD-10-CM | POA: Diagnosis not present

## 2019-05-30 DIAGNOSIS — I428 Other cardiomyopathies: Secondary | ICD-10-CM | POA: Diagnosis not present

## 2019-05-30 DIAGNOSIS — I10 Essential (primary) hypertension: Secondary | ICD-10-CM

## 2019-05-30 DIAGNOSIS — Z79899 Other long term (current) drug therapy: Secondary | ICD-10-CM | POA: Diagnosis not present

## 2019-05-30 DIAGNOSIS — F17200 Nicotine dependence, unspecified, uncomplicated: Secondary | ICD-10-CM

## 2019-05-30 DIAGNOSIS — E7849 Other hyperlipidemia: Secondary | ICD-10-CM

## 2019-05-30 HISTORY — DX: Encounter for immunization: Z23

## 2019-05-30 MED ORDER — ROSUVASTATIN CALCIUM 10 MG PO TABS: 10.0000 mg | ORAL_TABLET | Freq: Every day | ORAL | 3 refills | Status: DC

## 2019-05-30 NOTE — Progress Notes (Signed)
   CC: follow up for Heart failure and HTN  HPI:Devon Vega is a 63 y.o. male who presents for evaluation of heart failure and HTN. Please see individual problem based A/P for details.  Past Medical History:  Diagnosis Date  . Alcohol abuse    hx of , sober since 1997  . CHF (congestive heart failure) ([place]) 03/02/2017  . Chronic cough 03/10/2017  . COPD (chronic obstructive pulmonary disease) ([place])   . Hyperlipidemia   . Hypertension   . Hypertensive urgency 04/15/2017  . 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 Right shoulder pain 03/25/2018  . Sinus arrhythmia 05/24/2014  . Tobacco abuse    failed zyban  . Weight gain    Review of Systems:   ROS negative except as per HPI.  Physical Exam: Vitals:   05/30/19 0934  BP: 121/60  Pulse: 70  Temp: 98.3 F (36.8 C)  TempSrc: Oral  SpO2: 100%  Weight: 165 lb 12.8 oz (75.2 kg)  Height: 5\' 5"  (1.651 m)   General: A/O x4, in no acute distress, afebrile, nondiaphoretic HEENT: PEERL, EMO intact Cardio: RRR, no mrg's  Pulmonary: CTA bilaterally, no wheezing or crackles  Abdomen: Bowel sounds normal, soft, nontender  MSK: BLE nontender, nonedematous Neuro: Alert, CNII-XII grossly intact, conversational, normal gait Psych: Appropriate affect, not depressed in appearance, engages well  Assessment & Plan:   See Encounters Tab for problem based charting.  Patient discussed with Dr. Dareen Piano

## 2019-05-30 NOTE — Assessment & Plan Note (Signed)
  Need for Influenza vaccination: Patient has agreed to and is appropriate for the attenuated influenza vaccination.

## 2019-05-30 NOTE — Assessment & Plan Note (Signed)
Care Gaps: Need for colon cancer screening: Patient stated that he is to schedule a colonoscopy in January with his GI physician whom he believes works at [employer].

## 2019-05-30 NOTE — Assessment & Plan Note (Signed)
Need for pneumococcal vaccination: Age >68, PPSV23 appropriate.  Please administer at next visit.

## 2019-05-30 NOTE — Assessment & Plan Note (Signed)
NICM: Agree that this is likely 2/2 to HTN. Cath on 03/02/17 with normal coronary arteries. Echo on 11/11/17 with an EF of 40-45% w/ moderate aortic regurgitation but now 60-65% on 03/28/19 with mild to moderate aortic regurgitation. NYHA class 1 symptoms today. Denied edema of the lower extremities or dyspnea.   Plan: Continue Hydralazine 100mg  TID Continue Amlodipine 10mg  daily Continue spironolactone 25mg  daily Continue carvedilol 25mg  BID Continue Entresto 97-103 mg BID BMP at next visit

## 2019-05-30 NOTE — Assessment & Plan Note (Signed)
HLD: Lipid Panel     Component Value Date/Time   CHOL 183 03/01/2017 0514   TRIG 194 (H) 03/01/2017 0514   HDL 45 03/01/2017 0514   CHOLHDL 4.1 03/01/2017 0514   VLDL 39 03/01/2017 0514   LDLCALC 99 03/01/2017 0514  Stable. We will repeat in February when he returns for his PNA vaccine.  Plan:  Repeat lipid panel in February Continue Rosuvastatin, refilled today

## 2019-05-30 NOTE — Patient Instructions (Addendum)
FOLLOW-UP INSTRUCTIONS When: February of 2020 For: Your pneumonia vaccination and routine lab work What to bring: All of your medications  I have not made any changes to your medications today.   Today we discussed your heart failure, high blood pressure and need for a colonoscopy. Please be certain to call and schedule the colonoscopy in January.  Thank you for your visit to the [facility] today. If you have any questions or concerns please call us at [phone].

## 2019-05-30 NOTE — Assessment & Plan Note (Signed)
Continue to smoke. Will continue to discuss cessation at each visit.

## 2019-05-30 NOTE — Assessment & Plan Note (Signed)
  Hypertension: Patient's BP today is 121/60 with a goal of <140/80. The patient endorses adherence to his medication regimen. He denied, chest pain, headache, visual changes, lightheadedness, weakness, dizziness on standing, swelling in the feet or ankles.   Plan: Continue Hydralazine 100mg  TID Continue Amlodipine 10mg  daily Continue spironolactone 25mg  daily Continue Entresto 97-103 mg BID BMP at next visit

## 2019-06-01 NOTE — Progress Notes (Signed)
Internal Medicine Clinic Attending  Case discussed with Dr. Harbrecht at the time of the visit.  We reviewed the resident's history and exam and pertinent patient test results.  I agree with the assessment, diagnosis, and plan of care documented in the resident's note.   

## 2019-06-20 ENCOUNTER — Other Ambulatory Visit (HOSPITAL_COMMUNITY): Payer: Self-pay

## 2019-06-20 DIAGNOSIS — E7849 Other hyperlipidemia: Secondary | ICD-10-CM

## 2019-06-20 MED ORDER — ROSUVASTATIN CALCIUM 10 MG PO TABS: 10.0000 mg | ORAL_TABLET | Freq: Every day | ORAL | 3 refills | Status: DC

## 2019-08-05 ENCOUNTER — Other Ambulatory Visit (HOSPITAL_COMMUNITY): Payer: Self-pay

## 2019-08-05 MED ORDER — ISOSORBIDE MONONITRATE ER 30 MG PO TB24: 30.0000 mg | ORAL_TABLET | Freq: Every day | ORAL | 3 refills | Status: DC

## 2019-08-05 MED ORDER — ENTRESTO 97-103 MG PO TABS: 1.0000 | ORAL_TABLET | Freq: Two times a day (BID) | ORAL | 5 refills | Status: DC

## 2019-08-05 MED ORDER — AMLODIPINE BESYLATE 10 MG PO TABS: ORAL_TABLET | ORAL | 5 refills | Status: DC

## 2019-08-05 MED ORDER — HYDRALAZINE HCL 100 MG PO TABS: 100.0000 mg | ORAL_TABLET | Freq: Three times a day (TID) | ORAL | 3 refills | Status: DC

## 2019-08-22 ENCOUNTER — Other Ambulatory Visit (HOSPITAL_COMMUNITY): Payer: Self-pay

## 2019-08-22 MED ORDER — ENTRESTO 97-103 MG PO TABS: 1.0000 | ORAL_TABLET | Freq: Two times a day (BID) | ORAL | 5 refills | Status: DC

## 2019-08-22 MED ORDER — ISOSORBIDE MONONITRATE ER 30 MG PO TB24: 30.0000 mg | ORAL_TABLET | Freq: Every day | ORAL | 3 refills | Status: DC

## 2019-09-09 ENCOUNTER — Other Ambulatory Visit (HOSPITAL_COMMUNITY): Payer: Self-pay

## 2019-09-09 MED ORDER — DOXAZOSIN MESYLATE 1 MG PO TABS: 2.0000 mg | ORAL_TABLET | Freq: Every day | ORAL | 6 refills | Status: DC

## 2019-11-03 ENCOUNTER — Other Ambulatory Visit (HOSPITAL_COMMUNITY): Payer: Self-pay

## 2019-11-03 MED ORDER — ISOSORBIDE MONONITRATE ER 30 MG PO TB24: 30.0000 mg | ORAL_TABLET | Freq: Every day | ORAL | 2 refills | Status: DC

## 2019-11-14 ENCOUNTER — Encounter: Payer: Self-pay | Admitting: *Deleted

## 2020-01-18 ENCOUNTER — Other Ambulatory Visit (HOSPITAL_COMMUNITY): Payer: Self-pay | Admitting: *Deleted

## 2020-01-18 MED ORDER — AMLODIPINE BESYLATE 10 MG PO TABS: ORAL_TABLET | ORAL | 5 refills | Status: DC

## 2020-01-20 ENCOUNTER — Other Ambulatory Visit (HOSPITAL_COMMUNITY): Payer: Self-pay | Admitting: *Deleted

## 2020-01-23 ENCOUNTER — Other Ambulatory Visit (HOSPITAL_COMMUNITY): Payer: Self-pay | Admitting: *Deleted

## 2020-01-25 ENCOUNTER — Other Ambulatory Visit (HOSPITAL_COMMUNITY): Payer: Self-pay | Admitting: *Deleted

## 2020-01-26 ENCOUNTER — Other Ambulatory Visit (HOSPITAL_COMMUNITY): Payer: Self-pay | Admitting: *Deleted

## 2020-02-08 ENCOUNTER — Other Ambulatory Visit (HOSPITAL_COMMUNITY): Payer: Self-pay

## 2020-02-08 MED ORDER — AMLODIPINE BESYLATE 10 MG PO TABS: ORAL_TABLET | ORAL | 3 refills | Status: DC

## 2020-02-20 ENCOUNTER — Other Ambulatory Visit (HOSPITAL_COMMUNITY): Payer: Self-pay | Admitting: Internal Medicine

## 2020-04-06 ENCOUNTER — Other Ambulatory Visit (HOSPITAL_COMMUNITY): Payer: Self-pay | Admitting: Cardiology

## 2020-04-06 ENCOUNTER — Telehealth: Payer: Self-pay

## 2020-04-06 MED ORDER — ENTRESTO 97-103 MG PO TABS: 1.0000 | ORAL_TABLET | Freq: Two times a day (BID) | ORAL | 5 refills | Status: DC

## 2020-04-06 NOTE — Telephone Encounter (Signed)
Thank you :)

## 2020-04-06 NOTE — Telephone Encounter (Signed)
sacubitril-valsartan (ENTRESTO) 97-103 MG, REFILL REQUEST @  [facility] [phone] - [place], [place] Phone:  [phone]  Fax:  [phone]

## 2020-04-06 NOTE — Telephone Encounter (Signed)
Per chart review, Delene Loll has been filled by patient's Cardiologist for several years.  Pt called and instructed to please call Cardiologist for refills on Entresto, he verbalized understanding. SChaplin, RN,BSN

## 2020-04-06 NOTE — Telephone Encounter (Signed)
Patient called to request refill of entresto  Refills approved and scheduling instructions given to [facility] cardiology  to establish care

## 2020-04-11 ENCOUNTER — Telehealth: Payer: Self-pay | Admitting: *Deleted

## 2020-04-11 NOTE — Telephone Encounter (Signed)
Patient called [facility] asking for refills on isosorbide and spironolactone. Isosorbide #30 with 2 refills sent on 02/20/2020 by cards. Spironolactone last sent 04/12/2019 for 90 with 1 refill also by cards. Patient notified to contact cardiologist for refill on spironolactone and contact pharmacy for refill on isosorbide. Patient states he "graduated" from cardiology and does not even remember where his office is. Patient's last OV at [facility] was 05/2019 with 3 month return request. Appt scheduled for tomorrow to meet new PCP and for med refills. Patient was asked to bring all med bottles with him. states he will. Hubbard Hartshorn, BSN, RN-BC

## 2020-04-12 ENCOUNTER — Ambulatory Visit (INDEPENDENT_AMBULATORY_CARE_PROVIDER_SITE_OTHER): Payer: Medicare Other | Admitting: Student

## 2020-04-12 ENCOUNTER — Other Ambulatory Visit: Payer: Self-pay

## 2020-04-12 ENCOUNTER — Encounter: Payer: Self-pay | Admitting: Student

## 2020-04-12 VITALS — BP 131/71 | HR 66 | Temp 98.2°F | Wt 172.5 lb

## 2020-04-12 DIAGNOSIS — I10 Essential (primary) hypertension: Secondary | ICD-10-CM

## 2020-04-12 DIAGNOSIS — F17210 Nicotine dependence, cigarettes, uncomplicated: Secondary | ICD-10-CM | POA: Diagnosis not present

## 2020-04-12 DIAGNOSIS — Z1211 Encounter for screening for malignant neoplasm of colon: Secondary | ICD-10-CM | POA: Diagnosis not present

## 2020-04-12 DIAGNOSIS — Z0000 Encounter for general adult medical examination without abnormal findings: Secondary | ICD-10-CM

## 2020-04-12 DIAGNOSIS — I5022 Chronic systolic (congestive) heart failure: Secondary | ICD-10-CM | POA: Diagnosis not present

## 2020-04-12 DIAGNOSIS — I428 Other cardiomyopathies: Secondary | ICD-10-CM

## 2020-04-12 DIAGNOSIS — E7849 Other hyperlipidemia: Secondary | ICD-10-CM

## 2020-04-12 DIAGNOSIS — F17200 Nicotine dependence, unspecified, uncomplicated: Secondary | ICD-10-CM

## 2020-04-12 DIAGNOSIS — Z23 Encounter for immunization: Secondary | ICD-10-CM

## 2020-04-12 MED ORDER — ISOSORBIDE MONONITRATE ER 30 MG PO TB24: 30.0000 mg | ORAL_TABLET | Freq: Every day | ORAL | 2 refills | Status: DC

## 2020-04-12 MED ORDER — ENTRESTO 97-103 MG PO TABS: 1.0000 | ORAL_TABLET | Freq: Two times a day (BID) | ORAL | 5 refills | Status: DC

## 2020-04-12 MED ORDER — FLUTICASONE PROPIONATE 50 MCG/ACT NA SUSP: 1.0000 | Freq: Every day | NASAL | 3 refills | Status: DC | PRN

## 2020-04-12 MED ORDER — ROSUVASTATIN CALCIUM 10 MG PO TABS: 10.0000 mg | ORAL_TABLET | Freq: Every day | ORAL | 3 refills | Status: DC

## 2020-04-12 MED ORDER — DOXAZOSIN MESYLATE 1 MG PO TABS: 2.0000 mg | ORAL_TABLET | Freq: Every day | ORAL | 6 refills | Status: DC

## 2020-04-12 MED ORDER — SPIRONOLACTONE 25 MG PO TABS: 25.0000 mg | ORAL_TABLET | Freq: Every day | ORAL | 1 refills | Status: DC

## 2020-04-12 MED ORDER — HYDRALAZINE HCL 100 MG PO TABS: 100.0000 mg | ORAL_TABLET | Freq: Three times a day (TID) | ORAL | 3 refills | Status: DC

## 2020-04-12 MED ORDER — CARVEDILOL 25 MG PO TABS: ORAL_TABLET | ORAL | 3 refills | Status: DC

## 2020-04-12 MED ORDER — AMLODIPINE BESYLATE 10 MG PO TABS: ORAL_TABLET | ORAL | 3 refills | Status: DC

## 2020-04-12 NOTE — Assessment & Plan Note (Signed)
Referral to GI for colonoscopy placed today.

## 2020-04-12 NOTE — Assessment & Plan Note (Signed)
-   Flu shot: received today - COVID-19 vaccination: patient received Pfizer COVID-19 vaccines x 2, last dose 09/26/19, so he is eligible for his booster shot. Counseled patient regarding this, and he states he will schedule this. - Pneumococcal vaccination: Patient is eligible to receive PPSV23. He prefers to receive his flu shot today and get his pneumonia vaccine at his next visit in December 2021. - Colon cancer screening: Last colonoscopy in 02/2009. Patient requests referral for colonoscopy which was placed today. - Low dose CT for lung cancer screening: Patient has 25-pack-year smoking history. Counseled regarding recommendation to get annual LDCT for lung cancer surveillance, and he declines at this time.

## 2020-04-12 NOTE — Assessment & Plan Note (Addendum)
Patient previously followed by cardiology. Last visit with Dr. Haroldine Laws on 03/28/19. At that visit, echocardiogram demonstrated EF 60-65% with mild to moderate aortic regurgitation. Per chart review, patient thus graduated from the HF clinic.   He reports he has been doing well over the last year. Denies chest pain, shortness of breath, lower extremity edema, orthopnea, PND.  Plan: Advised patient that we are able to prescribe and manage his heart condition but we would refer him to cardiology if necessary in the future. Refills of all of his medications placed today. - BMP today to evaluate kidney function and electrolytes, especially potassium - Refilled and continue:  Hydralazine 100 mg three times daily Amlodipine 10 mg daily Carvedilol 25 mg twice daily Imdur 30 mg daily Doxazosin 2 mg twice daily Entresto 97-103 twice daily Spironolactone 25 mg daily

## 2020-04-12 NOTE — Assessment & Plan Note (Signed)
Patient reports adherence to rosuvastatin 10 mg daily. Last lipid panel ~1 year ago in August 2021.  Plan - Lipid panel today - Continue rosuvastatin 10 mg daily

## 2020-04-12 NOTE — Assessment & Plan Note (Signed)
Flu vaccine today 

## 2020-04-12 NOTE — Assessment & Plan Note (Signed)
Patient would like this at his next visit in December 2021.

## 2020-04-12 NOTE — Assessment & Plan Note (Signed)
Patient reports he continues to smoke 1/2 ppd, which he has done since he was 64 yo, so pack-year history as of today is 59. He reports he has tried nicotine patches previously but has not been successful at quitting. States he is motivated to quit, and knows he should especially since he watched as his father died from lung cancer.   Plan:  - Counseled patient on the benefit of using both nicotine patches and nicotine gum/lozenges together - Discussed indication for yearly low dose CT for lung cancer surveillance, and the patient declines at this time - Provided number for the Quit Line for patient to obtain patches and gum/lozenge - Revisit progress at subsequent visits

## 2020-04-12 NOTE — Progress Notes (Signed)
   CC: "I need refills," HTN, HLD, and NICM follow-up  HPI:  Devon Vega is a 64 y.o. gentleman with history of NICM, HTN, HLD, tobacco use who presents to clinic for refills of his medications, follow-up of his chronic medical conditions, and healthcare maintenance. His last clinic visit was ~10 months ago on 05/30/19.   To see the details of this patient's management of their acute and chronic problems, please refer to the Assessment & Plan under the Encounters tab.    Past Medical History:  Diagnosis Date  . Alcohol abuse    hx of , sober since 1997  . CHF (congestive heart failure) ([place]) 03/02/2017  . Chronic cough 03/10/2017  . COPD (chronic obstructive pulmonary disease) ([place])   . Hyperlipidemia   . Hypertension   . Hypertensive urgency 04/15/2017  . 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 Nerve compression 12/09/2018  . Right shoulder pain 03/25/2018  . Sinus arrhythmia 05/24/2014  . Tobacco abuse    failed zyban  . Weight gain    Review of Systems:    Review of Systems  Constitutional: Negative for chills, fever and weight loss.  Eyes: Negative for blurred vision.  Respiratory: Negative for cough and shortness of breath.   Cardiovascular: Negative for chest pain, orthopnea, leg swelling and PND.  Musculoskeletal: Negative for myalgias.  Neurological: Negative for dizziness and headaches.   Physical Exam:  Vitals:   04/12/20 1439 04/12/20 1516  BP: (!) 144/75 131/71  Pulse: 67 66  Temp: 98.2 F (36.8 C)   TempSrc: Oral   SpO2: 100%   Weight: 172 lb 8 oz (78.2 kg)    Constitutional: well-appearing man sitting in chair, in no acute distress HENT: normocephalic atraumatic, mucous membranes moist Eyes: conjunctiva non-erythematous Neck: supple Cardiovascular: regular rate and rhythm, no m/r/g; no lower extremity swelling; radial and PT pulses 3+ and symmetric Pulmonary/Chest: normal work of breathing on room air, lungs clear to auscultation  bilaterally Abdominal: soft, non-tender, non-distended MSK: normal bulk and tone Neurological: alert & oriented x 3, normal gait Psych: normal mood and affect    Assessment & Plan:   See Encounters Tab for problem based charting.  Patient seen with Dr. Jimmye Norman

## 2020-04-12 NOTE — Patient Instructions (Addendum)
Devon Vega,   Thank you for your visit to the [facility] today. It was a pleasure meeting you! Today we discussed the following:  1) Medication refills - I have sent refills of all of your medicines to your pharmacy  2) Blood work - We are getting blood work today to monitor your kidney function, electrolytes, and cholesterol. - I will call you if there are any abnormalities in these labs which warrant changing any of your medications  4) I placed a referral for your colonoscopy for colon cancer screening  5) Vaccinations - You received your flu shot today.  - You are also eligible to receive your [place] COVID-19 booster vaccination. Call or visit your pharmacy to schedule this. - We will plan on giving you your pneumonia vaccine (PPSV23 - more info below) at your next visit  6) Smoking cessation - Recommend you call 1-800-QUIT-NOW 606 551 7245) for information/supply of nicotine patches and gum/lozenge. Highly recommend you try the combination of patch and gum/lozenge.   I would like to see you back in 3 months. Please bring all of your medications with you.   If you have any questions or concerns, please call our clinic at [phone] between 9am-5pm. Outside of these hours, call [phone] and ask for the internal medicine resident on call. If you feel you are having a medical emergency please call 911.      Pneumococcal Polysaccharide Vaccine (PPSV23): What You Need to Know 1. Why get vaccinated? Pneumococcal polysaccharide vaccine (PPSV23) can prevent pneumococcal disease. Pneumococcal disease refers to any illness caused by pneumococcal bacteria. These bacteria can cause many types of illnesses, including pneumonia, which is an infection of the lungs. Pneumococcal bacteria are one of the most common causes of pneumonia. Besides pneumonia, pneumococcal bacteria can also cause:  Ear infections  Sinus infections  Meningitis (infection of  the tissue covering the brain and spinal cord)  Bacteremia (bloodstream infection) Anyone can get pneumococcal disease, but children under 34 years of age, people with certain medical conditions, adults 8 years or older, and cigarette smokers are at the highest risk. Most pneumococcal infections are mild. However, some can result in long-term problems, such as brain damage or hearing loss. Meningitis, bacteremia, and pneumonia caused by pneumococcal disease can be fatal. 2. PPSV23 PPSV23 protects against 23 types of bacteria that cause pneumococcal disease. PPSV23 is recommended for:  All adults 18 years or older,  Anyone 2 years or older with certain medical conditions that can lead to an increased risk for pneumococcal disease. Most people need only one dose of PPSV23. A second dose of PPSV23, and another type of pneumococcal vaccine called PCV13, are recommended for certain high-risk groups. Your health care provider can give you more information. People 65 years or older should get a dose of PPSV23 even if they have already gotten one or more doses of the vaccine before they turned 70. 3. Talk with your health care provider Tell your vaccine provider if the person getting the vaccine:  Has had an allergic reaction after a previous dose of PPSV23, or has any severe, life-threatening allergies. In some cases, your health care provider may decide to postpone PPSV23 vaccination to a future visit. People with minor illnesses, such as a cold, may be vaccinated. People who are moderately or severely ill should usually wait until they recover before getting PPSV23. Your health care provider can give you more information. 4. Risks of a vaccine reaction  Redness or pain where the  shot is given, feeling tired, fever, or muscle aches can happen after PPSV23. People sometimes faint after medical procedures, including vaccination. Tell your provider if you feel dizzy or have vision changes or ringing  in the ears. As with any medicine, there is a very remote chance of a vaccine causing a severe allergic reaction, other serious injury, or death. 5. What if there is a serious problem? An allergic reaction could occur after the vaccinated person leaves the clinic. If you see signs of a severe allergic reaction (hives, swelling of the face and throat, difficulty breathing, a fast heartbeat, dizziness, or weakness), call 9-1-1 and get the person to the nearest hospital. For other signs that concern you, call your health care provider. Adverse reactions should be reported to the Vaccine Adverse Event Reporting System (VAERS). Your health care provider will usually file this report, or you can do it yourself. Visit the VAERS website at www.vaers.SamedayNews.es or call 216-142-3135. VAERS is only for reporting reactions, and VAERS staff do not give medical advice. 6. How can I learn more?  Ask your health care provider.  Call your local or state health department.  Contact the Centers for Disease Control and Prevention (CDC): ? Call (340) 396-3587 (1-800-CDC-INFO) or ? Visit CDC's website at http://hunter.com/ CDC Vaccine Information Statement PPSV23 Vaccine (05/12/2018) This information is not intended to replace advice given to you by your health care provider. Make sure you discuss any questions you have with your health care provider. Document Revised: 10/19/2018 Document Reviewed: 02/09/2018 Elsevier Patient Education  [place].

## 2020-04-12 NOTE — Assessment & Plan Note (Signed)
BP today 144/75, 131/71 on repeat with a goal of <140/80.   He reports adherence to all of his medications except for spironolactone because he ran out of his prescription 4-5 days ago. Denies headache, dizziness, lightheadedness. Reports at home his BP averages around 120/70. He is prescribed all of the following antihypertension/heart failure medications:  Hydralazine 100 mg three times daily Amlodipine 10 mg daily Carvedilol 25 mg twice daily Imdur 30 mg daily Doxazosin 2 mg twice daily Entresto 97-103 twice daily Spironolactone 25 mg daily  A/P: Advised patient that since he is technically at goal without being on his spironolactone for the last 4-5 days, we could hold his spironolactone and bring him back in 1 month to reassess his need to be on it or we can continue him on it and reassess in 3 months. He chose the latter option. His last BMP to evaluate his kidney function and electrolytes was ~1 year ago on 03/28/19 and was unremarkable, so we will repeat today. Refilled all of his medications today.  Plan: - BMP today - Refill and continue:   Hydralazine 100 mg three times daily Amlodipine 10 mg daily Carvedilol 25 mg twice daily Imdur 30 mg daily Doxazosin 2 mg twice daily Entresto 97-103 twice daily Spironolactone 25 mg daily  Follow-up in 3 months.

## 2020-04-13 LAB — BMP8+ANION GAP
Anion Gap: 16 mmol/L (ref 10.0–18.0)
BUN/Creatinine Ratio: 10 (ref 10–24)
BUN: 9 mg/dL (ref 8–27)
CO2: 20 mmol/L (ref 20–29)
Calcium: 9.1 mg/dL (ref 8.6–10.2)
Chloride: 105 mmol/L (ref 96–106)
Creatinine, Ser: 0.92 mg/dL (ref 0.76–1.27)
GFR calc Af Amer: 100 mL/min/{1.73_m2} (ref >59)
GFR calc non Af Amer: 86 mL/min/{1.73_m2} (ref >59)
Glucose: 117 mg/dL — ABNORMAL HIGH (ref 65–99)
Potassium: 4.1 mmol/L (ref 3.5–5.2)
Sodium: 141 mmol/L (ref 134–144)

## 2020-04-13 LAB — LIPID PANEL
Chol/HDL Ratio: 2.4 ratio (ref 0.0–5.0)
Cholesterol, Total: 113 mg/dL (ref 100–199)
HDL: 47 mg/dL (ref >39)
LDL Chol Calc (NIH): 47 mg/dL (ref 0–99)
Triglycerides: 105 mg/dL (ref 0–149)
VLDL Cholesterol Cal: 19 mg/dL (ref 5–40)

## 2020-04-16 NOTE — Progress Notes (Signed)
Internal Medicine Clinic Attending  I saw and evaluated the patient.  I personally confirmed the key portions of the history and exam documented by Dr. Watson and I reviewed pertinent patient test results.  The assessment, diagnosis, and plan were formulated together and I agree with the documentation in the resident's note.  

## 2020-04-26 ENCOUNTER — Encounter: Payer: Self-pay | Admitting: *Deleted

## 2020-05-16 ENCOUNTER — Other Ambulatory Visit (HOSPITAL_COMMUNITY): Payer: Self-pay | Admitting: Internal Medicine

## 2020-05-30 ENCOUNTER — Encounter: Payer: Self-pay | Admitting: *Deleted

## 2020-06-14 ENCOUNTER — Ambulatory Visit: Payer: Medicare Other | Attending: Internal Medicine

## 2020-06-14 DIAGNOSIS — Z23 Encounter for immunization: Secondary | ICD-10-CM

## 2020-06-14 NOTE — Progress Notes (Signed)
   Covid-19 Vaccination Clinic  Name:  Devon Vega    MRN: [id number] DOB: 03/30/1956  06/14/2020  Devon Vega was observed post Covid-19 immunization for 15 minutes without incident. He was provided with Vaccine Information Sheet and instruction to access the V-Safe system.   Devon Vega was instructed to call 911 with any severe reactions post vaccine: Marland Kitchen Difficulty breathing  . Swelling of face and throat  . A fast heartbeat  . A bad rash all over body  . Dizziness and weakness   Immunizations Administered    Name Date Dose VIS Date Route   Pfizer COVID-19 Vaccine 06/14/2020  1:57 PM 0.3 mL 05/02/2020 Intramuscular   Manufacturer: [place]   Lot: [id number]   [place]: [id number]

## 2020-06-28 NOTE — Addendum Note (Signed)
Addended by: Hulan Fray on: 06/28/2020 04:45 PM   Modules accepted: Orders

## 2020-07-08 ENCOUNTER — Other Ambulatory Visit: Payer: Self-pay | Admitting: Student

## 2020-08-20 ENCOUNTER — Other Ambulatory Visit (HOSPITAL_COMMUNITY): Payer: Self-pay

## 2020-08-20 MED ORDER — ISOSORBIDE MONONITRATE ER 30 MG PO TB24: 30.0000 mg | ORAL_TABLET | Freq: Every day | ORAL | 2 refills | Status: DC

## 2020-09-18 ENCOUNTER — Other Ambulatory Visit: Payer: Self-pay | Admitting: Student

## 2020-09-19 NOTE — Telephone Encounter (Signed)
Will send 30d supply to pharmacy. Front desk--please arrange a hypertension follow up visit

## 2020-10-11 ENCOUNTER — Encounter: Payer: Self-pay | Admitting: Student

## 2020-10-11 ENCOUNTER — Ambulatory Visit (INDEPENDENT_AMBULATORY_CARE_PROVIDER_SITE_OTHER): Payer: Medicare Other | Admitting: Student

## 2020-10-11 ENCOUNTER — Other Ambulatory Visit: Payer: Self-pay

## 2020-10-11 VITALS — BP 132/62 | HR 68 | Temp 98.4°F | Wt 173.3 lb

## 2020-10-11 DIAGNOSIS — J302 Other seasonal allergic rhinitis: Secondary | ICD-10-CM

## 2020-10-11 DIAGNOSIS — Z23 Encounter for immunization: Secondary | ICD-10-CM

## 2020-10-11 DIAGNOSIS — E7849 Other hyperlipidemia: Secondary | ICD-10-CM | POA: Diagnosis not present

## 2020-10-11 DIAGNOSIS — I10 Essential (primary) hypertension: Secondary | ICD-10-CM | POA: Diagnosis not present

## 2020-10-11 DIAGNOSIS — I428 Other cardiomyopathies: Secondary | ICD-10-CM

## 2020-10-11 DIAGNOSIS — Z0000 Encounter for general adult medical examination without abnormal findings: Secondary | ICD-10-CM

## 2020-10-11 DIAGNOSIS — F17200 Nicotine dependence, unspecified, uncomplicated: Secondary | ICD-10-CM

## 2020-10-11 MED ORDER — ROSUVASTATIN CALCIUM 10 MG PO TABS: 10.0000 mg | ORAL_TABLET | Freq: Every day | ORAL | 1 refills | Status: DC

## 2020-10-11 MED ORDER — AMLODIPINE BESYLATE 10 MG PO TABS: ORAL_TABLET | ORAL | 1 refills | Status: DC

## 2020-10-11 MED ORDER — SPIRONOLACTONE 25 MG PO TABS: ORAL_TABLET | ORAL | 1 refills | Status: DC

## 2020-10-11 MED ORDER — FLUTICASONE PROPIONATE 50 MCG/ACT NA SUSP: 1.0000 | Freq: Every day | NASAL | 2 refills | Status: DC | PRN

## 2020-10-11 NOTE — Assessment & Plan Note (Signed)
BP today 132/62.  Reports adherence to all of his medications. Denies headache, dizziness, lightheadedness, dyspnea, lower extremity edema. Reports at home his BP average 120s/70s-80s. He is prescribed all of the following antihypertensive/heart failure medications:  Hydralazine 100 mg three times daily Amlodipine 10 mg daily Carvedilol 25 mg twice daily Imdur 30 mg daily Doxazosin 2 mg twice daily Entresto 97-103 twice daily Spironolactone 25 mg daily  Plan:   - continue Hydralazine 100 mg three times daily - continue Amlodipine 10 mg daily - continue Carvedilol 25 mg twice daily - continue Imdur 30 mg daily - continue Doxazosin 2 mg twice daily - continue Entresto 97-103 twice daily - continue Spironolactone 25 mg daily  Follow-up in 3 months.

## 2020-10-11 NOTE — Assessment & Plan Note (Signed)
Last lipid panel 04/12/20 within normal limits. Continue rosuvastatin 10 mg daily.

## 2020-10-11 NOTE — Assessment & Plan Note (Signed)
Patient received PCV13 today.

## 2020-10-11 NOTE — Patient Instructions (Addendum)
Devon Vega,   Thank you for your visit to the [facility] today. It was a pleasure seeing you! Today we discussed the following:  1) Medication refills: I have sent refills of your medications to your pharmacy  2) Please schedule your colonoscopy!   3) Vaccinations: You received your pneumonia vaccine today.  4) Smoking cessation - Awesome job using 1-800-QUIT-NOW (838)202-9651) for information/supply of nicotine patches and gum/lozenge. Highly recommend you try the combination of patch and gum/lozenge when you stop smoking this week.   I would like to see you back in 3 months. After June, you will be assigned a new primary care doctor. Please bring all of your medications with you.

## 2020-10-11 NOTE — Assessment & Plan Note (Signed)
-   COVID-19 vaccination: Patient got his COVID booster since last visit. - Pneumococcal vaccination: Patient received PCV13 today. - Colon cancer screening: Last colonoscopy in 02/2009. Referral placed at last visit in 03/2020. Patient states he will call to schedule. - Low dose CT for lung cancer screening: Patient has 25-pack-year smoking history. Counseled regarding recommendation to get annual LDCT for lung cancer surveillance, and he declines at this time.

## 2020-10-11 NOTE — Progress Notes (Signed)
   CC: medication refills  HPI:  [name] is a 65 y.o. man with history as below who presents to clinic for follow-up of his chronic medical problems. His last clinic visit was on 04/12/20.   To see the details of this patient's management of their acute and chronic problems, please refer to the Assessment & Plan under the Encounters tab.    Past Medical History:  Diagnosis Date  . Alcohol abuse    hx of , sober since 1997  . CHF (congestive heart failure) ([place]) 03/02/2017  . Chronic cough 03/10/2017  . COPD (chronic obstructive pulmonary disease) ([place])   . Hyperlipidemia   . Hypertension   . Hypertensive urgency 04/15/2017  . 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 Nerve compression 12/09/2018  . Right shoulder pain 03/25/2018  . Sinus arrhythmia 05/24/2014  . Tobacco abuse    failed zyban  . Weight gain    Review of Systems:    Review of Systems  Constitutional: Negative for chills and fever.  HENT: Negative for congestion.   Eyes: Negative for blurred vision.  Respiratory: Negative for cough and shortness of breath.   Cardiovascular: Negative for chest pain, palpitations and leg swelling.  Musculoskeletal: Negative for myalgias.  Neurological: Negative for dizziness, weakness and headaches.    Physical Exam:  Vitals:   10/11/20 1527 10/11/20 1729  BP: (!) 141/67 132/62  Pulse: 68   Temp: 98.4 F (36.9 C)   TempSrc: Oral   SpO2: 99%   Weight: 173 lb 4.8 oz (78.6 kg)    Constitutional: very pleasant, well-appearing man sitting in chair, in no acute distress HENT: normocephalic atraumatic, mucous membranes moist Eyes: conjunctiva non-erythematous Neck: supple Cardiovascular: regular rate and rhythm, no m/r/g, no lower extremity edema Pulmonary/Chest: normal work of breathing on room air, lungs clear to auscultation bilaterally, no wheezes rales or rhonchi Abdominal: soft non-distended MSK: normal bulk and tone Neurological: alert & oriented x  3, normal gait Skin: warm and dry Psych: normal mood and affect  Assessment & Plan:   See Encounters Tab for problem-based charting.  Patient discussed with Dr. Heber [place]

## 2020-10-11 NOTE — Assessment & Plan Note (Addendum)
Patient reports he continues to smoke 1/2 ppd but since last visit called the quit line and got patches and gum, which he has not tried yet. States he has a friend who quit 28-month ago, so he feels like he can quit too. States he plans on finishing his last full pack this week and will stop after.   Plan: Patient states he plans to quit this week. - Counseled patient on the benefit of using both nicotine patches and nicotine gum/lozenges together - Discussed indication for yearly low dose CT for lung cancer surveillance, and the patient declines at this time

## 2020-10-11 NOTE — Assessment & Plan Note (Addendum)
Mr. Leon reports he has been feeling well over the last 6 months. Denies chest pain, palpitations, shortness of breath, lower extremity edema, orthopnea, PND. On exam, his lungs are clear, his heart sounds are normal, and he has no lower extremity edema.  Plan:   - continue Hydralazine 100 mg three times daily - continue Amlodipine 10 mg daily - continue Carvedilol 25 mg twice daily - continue Imdur 30 mg daily - continue Doxazosin 2 mg twice daily - continue Entresto 97-103 twice daily - continue Spironolactone 25 mg daily  Follow-up in 3 months.

## 2020-10-13 NOTE — Progress Notes (Signed)
Internal Medicine Clinic Attending  Case discussed with Dr. Watson  At the time of the visit.  We reviewed the resident's history and exam and pertinent patient test results.  I agree with the assessment, diagnosis, and plan of care documented in the resident's note.  

## 2020-10-16 ENCOUNTER — Encounter: Payer: Self-pay | Admitting: *Deleted

## 2020-10-16 NOTE — Progress Notes (Unsigned)

## 2020-10-24 NOTE — Progress Notes (Unsigned)
Things That May Be Affecting Your Health:  Alcohol  Hearing loss  Pain    Depression  Home Safety  Sexual Health   Diabetes  Lack of physical activity  Stress   Difficulty with daily activities  Loneliness  Tiredness   Drug use  Medicines + Tobacco use   Falls  Motor Vehicle Safety  Weight   Food choices  Oral Health  Other    YOUR PERSONALIZED HEALTH PLAN : 1. Schedule your next subsequent Medicare Wellness visit in one year 2. Attend all of your regular appointments to address your medical issues 3. Complete the preventative screenings and services   Annual Wellness Visit   Medicare Covered Preventative Screenings and [place] Men and Women Who How Often Need? Date of Last Service Action  Abdominal Aortic Aneurysm Adults with AAA risk factors Once +     Alcohol Misuse and Counseling All Adults Screening once a year if no alcohol misuse. Counseling up to 4 face to face sessions.     Bone Density Measurement  Adults at risk for osteoporosis Once every 2 yrs      Lipid Panel Z13.6 All adults without CV disease Once every 5 yrs       Colorectal Cancer   Stool sample or  Colonoscopy All adults 107 and older   Once every year  Every 10 years +       Depression All Adults Once a year  Today   Diabetes Screening Blood glucose, post glucose load, or GTT Z13.1  All adults at risk  Pre-diabetics  Once per year  Twice per year      Diabetes  Self-Management Training All adults Diabetics 10 hrs first year; 2 hours subsequent years. Requires Copay     Glaucoma  Diabetics  Family history of glaucoma  African Americans 28 yrs +  Hispanic Americans 47 yrs + Annually - requires coppay      Hepatitis C Z72.89 or F19.20  High Risk for HCV  Born between 1945 and 1965  Annually  Once +     HIV Z11.4 All adults based on risk  Annually btw ages 52 & 90 regardless of risk  Annually > 65 yrs if at increased risk      Lung Cancer Screening  Asymptomatic adults aged 28-77 with 30 pack yr history and current smoker OR quit within the last 15 yrs Annually Must have counseling and shared decision making documentation before first screen +     Medical Nutrition Therapy Adults with   Diabetes  Renal disease  Kidney transplant within past 3 yrs 3 hours first year; 2 hours subsequent years     Obesity and Counseling All adults Screening once a year Counseling if BMI 30 or higher  Today   Tobacco Use Counseling Adults who use tobacco  Up to 8 visits in one year +    Vaccines Z23  Hepatitis B  Influenza   Pneumonia  Adults   Once  Once every flu season  Two different vaccines separated by one year     Next Annual Wellness Visit People with Medicare Every year  Today     Services & Screenings Women Who How Often Need  Date of Last Service Action  Mammogram  Z12.31 Women over 75 One baseline ages 34-39. Annually ager 40 yrs+      Pap tests All women Annually if high risk. Every 2 yrs for normal risk women  Screening for cervical cancer with   Pap (Z01.419 nl or Z01.411abnl) &  HPV Z11.51 Women aged 49 to 74 Once every 5 yrs     Screening pelvic and breast exams All women Annually if high risk. Every 2 yrs for normal risk women     Sexually Transmitted Diseases  Chlamydia  Gonorrhea  Syphilis All at risk adults Annually for non pregnant females at increased risk         [place] Men Who How Ofter Need  Date of Last Service Action  Prostate Cancer - DRE & PSA Men over 50 Annually.  DRE might require a copay. +       Sexually Transmitted Diseases  Syphilis All at risk adults Annually for men at increased risk      Health Maintenance List Health Maintenance  Topic Date Due  . Hepatitis C Screening  Never done  . COLONOSCOPY (Pts 45-28yrs Insurance coverage will need to be confirmed)  03/09/2019  . INFLUENZA VACCINE  02/11/2021  . PNA vac Low Risk Adult (2 of 2 - PPSV23) 10/11/2021   . TETANUS/TDAP  05/06/2024  . COVID-19 Vaccine  Completed  . HPV VACCINES  Aged Out

## 2020-11-14 ENCOUNTER — Other Ambulatory Visit (HOSPITAL_COMMUNITY): Payer: Self-pay | Admitting: Internal Medicine

## 2020-11-15 ENCOUNTER — Encounter: Payer: Self-pay | Admitting: Student

## 2020-11-15 ENCOUNTER — Other Ambulatory Visit: Payer: Self-pay

## 2020-11-15 ENCOUNTER — Ambulatory Visit (INDEPENDENT_AMBULATORY_CARE_PROVIDER_SITE_OTHER): Payer: Medicare Other | Admitting: Student

## 2020-11-15 DIAGNOSIS — Z0000 Encounter for general adult medical examination without abnormal findings: Secondary | ICD-10-CM | POA: Diagnosis not present

## 2020-11-15 MED ORDER — ENTRESTO 97-103 MG PO TABS: 1.0000 | ORAL_TABLET | Freq: Two times a day (BID) | ORAL | 5 refills | Status: DC

## 2020-11-15 NOTE — Progress Notes (Signed)
This AWV is being conducted by [facility] only. The patient was located at home and I was located in [facility]. The patient's identity was confirmed using their DOB and current address. The patient or his/her legal guardian has consented to being evaluated through a telephone encounter and understands the associated risks (an examination cannot be done and the patient may need to come in for an appointment) / benefits (allows the patient to remain at home, decreasing exposure to coronavirus). I personally spent 26 minutes conducting the AWV.  Subjective:   Devon Vega is a 65 y.o. male who presents for a Medicare Annual Wellness Visit.  The following items have been reviewed and updated today in the appropriate area in the EMR.   Health Risk Assessment  Height, weight, BMI, and BP Visual acuity if needed Depression screen Fall risk / safety level Advance directive discussion Medical and family history were reviewed and updated Updating list of other providers & suppliers Medication reconciliation, including over the counter medicines Cognitive screen Written screening schedule Risk Factor list Personalized health advice, risky behaviors, and treatment advice  Social History   Social History Narrative   Current smoker-  0.5 ppd x ~ 45 yrs   Alcohol use- ~2 beers/day; 6 pk beer over a weekend   Divorced, married 17 yrs- no children      Current Social History 11/15/2020        Patient lives alone in a one level home with 4 outside steps with handrails       Patient's method of transportation is personal vehicle.      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 Alyxander Kollmann, BSN, RN-BC            Objective:    Vitals: There were no vitals taken for this visit. Vitals are unable to obtained due to [id number] public health emergency  Activities of Daily Living In your present state of health, do you have any difficulty performing the following activities: 11/15/2020 10/11/2020  Hearing? N N  Vision? N N  Difficulty concentrating or making decisions? Y N  Walking or climbing stairs? N N  Dressing or bathing? N N  Doing errands, shopping? N N  Some recent data might be hidden    Goals Goals    .  Continue current level of physical activity      Work in vegetable garden twice weekly for 30 minutes Ride bike twice weekly for 30 minutes Walk 3 times per week for 20 minutes    .  Quit Smoking (pt-stated)       Fall Risk Fall Risk  11/15/2020 10/11/2020 04/12/2020 05/30/2019 04/21/2018  Falls in the past year? 0 0 0 0 No  Number falls in past yr: - 0 0 - -  Injury with Fall? - 0 0 0 -  Risk for fall due to : No Fall Risks No Fall Risks - - -  Risk for fall due to: Comment - - - - -  Follow up Education provided;Falls prevention discussed Falls evaluation completed - Falls prevention discussed -   CDC Handout on Fall Prevention and Handout on Home Exercise Program, Access codes [email or web address] and [email or web address] mailed to patient with exercise band.   Depression Screen PHQ 2/9 Scores 11/15/2020 10/11/2020 04/12/2020 05/30/2019  PHQ - 2 Score 0 0 0 1  PHQ- 9 Score 0 0 0 -     Cognitive Testing Six-Item Cognitive Screener   "I would like to ask you some questions that ask you to use your memory. I am going to name three objects. Please wait until I say all three words, then repeat them. Remember what they are  because I am going to ask you to name them again in a few minutes. Please repeat these words for me: APPLE--TABLE--PENNY." (Interviewer may repeat names 3 times if necessary but repetition not scored.)  Did patient correctly  repeat all three words? Yes - may proceed with screen  What year is this? Correct What month is this? Correct What day of the week is this? Correct  What were the three objects I asked you to remember? . Apple Correct . Table Correct . Penny Correct  Score one point for each incorrect answer.  A score of 2 or more points warrants additional investigation.  Patient's score 0   Assessment and Plan:     Patient would like to discuss AAA u/s and lung cancer screening with PCP at upcoming visit before scheduling these. He is willing to have Hep C and PSA drawn at next visit on 12/25/20 He is willing to have colonoscopy and states PCP referred him to [facility]. He will call them for an appt. Patient would like to quit smoking. [place] Quitline discussed and pamphlet mailed. He will also consider [facility] and Pharm D for assistance with quitting. He will begin seated and standing exercises with exercise band to increase strength and balance.  During the course of the visit the patient was educated and counseled about appropriate screening and preventive services as documented in the assessment and plan.  The printed AVS was given to the patient and included an updated screening schedule, a list of risk factors, and personalized health advice.        Velora Heckler, RN  11/15/2020

## 2020-11-15 NOTE — Telephone Encounter (Signed)
Pt is requesting hissacubitril-valsartan (ENTRESTO) 97-103 MG sent to   [id number] - [place], [address] Phone:  [phone]  Fax:  [phone]

## 2020-11-15 NOTE — Progress Notes (Signed)
  I discussed the AWV findings with the RN who conducted the visit. I was present in the office suite and immediately available to provide assistance and direction throughout the time the service was provided.  Edison Simon, MD PGY1 Internal Medicine [phone]

## 2020-11-15 NOTE — Patient Instructions (Addendum)
Things That May Be Affecting Your Health:  Alcohol  Hearing loss  Pain    Depression  Home Safety  Sexual Health   Diabetes  Lack of physical activity  Stress   Difficulty with daily activities  Loneliness  Tiredness   Drug use  Medicines + Tobacco use   Falls  Motor Vehicle Safety  Weight   Food choices  Oral Health  Other    YOUR PERSONALIZED HEALTH PLAN : 1. Schedule your next subsequent Medicare Wellness visit in one year 2. Attend all of your regular appointments to address your medical issues 3. Complete the preventative screenings and services 4. Please discuss AAA u/s and lung cancer screening with Dr. Claudette Laws at upcoming visit on 12/25/2020 5. You can have Hep C and PSA drawn at next visit on 12/25/20 6. Please call [facility] at [phone] to schedule your colonoscopy. 7. Congratulations on your goal to quit smoking! Please contact [facility] (1-800-QUIT-NOW) and consider [facility] and [facility] for assistance with quitting. 8. Please begin seated and standing exercises with exercise band to increase strength and balance.   Annual Wellness Visit                       Medicare Covered Preventative Screenings and Services  Services & Screenings Men and Women Who How Often Need? Date of Last Service Action  Abdominal Aortic Aneurysm Adults with AAA risk factors Once +     Alcohol Misuse and Counseling All Adults Screening once a year if no alcohol misuse. Counseling up to 4 face to face sessions.     Bone Density Measurement  Adults at risk for osteoporosis Once every 2 yrs      Lipid Panel Z13.6 All adults without CV disease Once every 5 yrs       Colorectal Cancer   Stool sample or  Colonoscopy All adults 50 and older   Once every year  Every 10 years +       Depression All Adults Once a year  Today   Diabetes Screening Blood glucose, post glucose load, or GTT Z13.1  All adults at risk  Pre-diabetics   Once per year  Twice per year      Diabetes  Self-Management Training All adults Diabetics 10 hrs first year; 2 hours subsequent years. Requires Copay     Glaucoma  Diabetics  Family history of glaucoma  African Americans 50 yrs +  Hispanic Americans 65 yrs + Annually - requires coppay      Hepatitis C Z72.89 or F19.20  High Risk for HCV  Born between 1945 and 1965  Annually  Once +     HIV Z11.4 All adults based on risk  Annually btw ages 10 & 55 regardless of risk  Annually > 65 yrs if at increased risk      Lung Cancer Screening Asymptomatic adults aged 28-77 with 30 pack yr history and current smoker OR quit within the last 15 yrs Annually Must have counseling and shared decision making documentation before first screen +     Medical Nutrition Therapy Adults with   Diabetes  Renal disease  Kidney transplant within past 3 yrs 3 hours first year; 2 hours subsequent years     Obesity and Counseling All adults Screening once a year Counseling if BMI 30 or higher  Today   Tobacco Use Counseling Adults who use tobacco  Up to 8 visits in one year +  Vaccines Z23  Hepatitis B  Influenza   Pneumonia  Adults   Once  Once every flu season  Two different vaccines separated by one year     Next Annual Wellness Visit People with Medicare Every year  Today     [place] Women Who How Often Need  Date of Last Service Action  Mammogram  Z12.31 Women over 19 One baseline ages 78-39. Annually ager 40 yrs+      Pap tests All women Annually if high risk. Every 2 yrs for normal risk women      Screening for cervical cancer with   Pap (Z01.419 nl or Z01.411abnl) &  HPV Z11.51 Women aged 59 to 14 Once every 5 yrs     Screening pelvic and breast exams All women Annually if high risk. Every 2 yrs for normal risk women     Sexually Transmitted Diseases  Chlamydia  Gonorrhea  Syphilis All at risk  adults Annually for non pregnant females at increased risk         [place] Men Who How Ofter Need  Date of Last Service Action  Prostate Cancer - DRE & PSA Men over 50 Annually.  DRE might require a copay. +       Sexually Transmitted Diseases  Syphilis All at risk adults Annually for men at increased risk        Fall Prevention in the Home, Adult Falls can cause injuries and can happen to people of all ages. There are many things you can do to make your home safe and to help prevent falls. Ask for help when making these changes. What actions can I take to prevent falls? General Instructions  Use good lighting in all rooms. Replace any light bulbs that burn out.  Turn on the lights in dark areas. Use night-lights.  Keep items that you use often in easy-to-reach places. Lower the shelves around your home if needed.  Set up your furniture so you have a clear path. Avoid moving your furniture around.  Do not have throw rugs or other things on the floor that can make you trip.  Avoid walking on wet floors.  If any of your floors are uneven, fix them.  Add color or contrast paint or tape to clearly mark and help you see: ? Grab bars or handrails. ? First and last steps of staircases. ? Where the edge of each step is.  If you use a stepladder: ? Make sure that it is fully opened. Do not climb a closed stepladder. ? Make sure the sides of the stepladder are locked in place. ? Ask someone to hold the stepladder while you use it.  Know where your pets are when moving through your home. What can I do in the bathroom?  Keep the floor dry. Clean up any water on the floor right away.  Remove soap buildup in the tub or shower.  Use nonskid mats or decals on the floor of the tub or shower.  Attach bath mats securely with double-sided, nonslip rug tape.  If you need to sit down in the shower, use a plastic, nonslip stool.  Install grab bars by  the toilet and in the tub and shower. Do not use towel bars as grab bars.      What can I do in the bedroom?  Make sure that you have a light by your bed that is easy to reach.  Do not use any sheets or  blankets for your bed that hang to the floor.  Have a firm chair with side arms that you can use for support when you get dressed. What can I do in the kitchen?  Clean up any spills right away.  If you need to reach something above you, use a step stool with a grab bar.  Keep electrical cords out of the way.  Do not use floor polish or wax that makes floors slippery. What can I do with my stairs?  Do not leave any items on the stairs.  Make sure that you have a light switch at the top and the bottom of the stairs.  Make sure that there are handrails on both sides of the stairs. Fix handrails that are broken or loose.  Install nonslip stair treads on all your stairs.  Avoid having throw rugs at the top or bottom of the stairs.  Choose a carpet that does not hide the edge of the steps on the stairs.  Check carpeting to make sure that it is firmly attached to the stairs. Fix carpet that is loose or worn. What can I do on the outside of my home?  Use bright outdoor lighting.  Fix the edges of walkways and driveways and fix any cracks.  Remove anything that might make you trip as you walk through a door, such as a raised step or threshold.  Trim any bushes or trees on paths to your home.  Check to see if handrails are loose or broken and that both sides of all steps have handrails.  Install guardrails along the edges of any raised decks and porches.  Clear paths of anything that can make you trip, such as tools or rocks.  Have leaves, snow, or ice cleared regularly.  Use sand or salt on paths during winter.  Clean up any spills in your garage right away. This includes grease or oil spills. What other actions can I take?  Wear shoes that: ? Have a low heel. Do not  wear high heels. ? Have rubber bottoms. ? Feel good on your feet and fit well. ? Are closed at the toe. Do not wear open-toe sandals.  Use tools that help you move around if needed. These include: ? Canes. ? Walkers. ? Scooters. ? Crutches.  Review your medicines with your doctor. Some medicines can make you feel dizzy. This can increase your chance of falling. Ask your doctor what else you can do to help prevent falls. Where to find more information  Centers for Disease Control and Prevention, STEADI: http://www.wolf.info/  National Institute on Aging: http://kim-miller.com/ Contact a doctor if:  You are afraid of falling at home.  You feel weak, drowsy, or dizzy at home.  You fall at home. Summary  There are many simple things that you can do to make your home safe and to help prevent falls.  Ways to make your home safe include removing things that can make you trip and installing grab bars in the bathroom.  Ask for help when making these changes in your home. This information is not intended to replace advice given to you by your health care provider. Make sure you discuss any questions you have with your health care provider. Document Revised: 02/01/2020 Document Reviewed: 02/01/2020 Elsevier Patient Education  [place] Maintenance, Male Adopting a healthy lifestyle and getting preventive care are important in promoting health and wellness. Ask your health care provider about:  The right schedule for  you to have regular tests and exams.  Things you can do on your own to prevent diseases and keep yourself healthy. What should I know about diet, weight, and exercise? Eat a healthy diet  Eat a diet that includes plenty of vegetables, fruits, low-fat dairy products, and lean protein.  Do not eat a lot of foods that are high in solid fats, added sugars, or sodium.   Maintain a healthy weight Body mass index (BMI) is a measurement that can be used to identify  possible weight problems. It estimates body fat based on height and weight. Your health care provider can help determine your BMI and help you achieve or maintain a healthy weight. Get regular exercise Get regular exercise. This is one of the most important things you can do for your health. Most adults should:  Exercise for at least 150 minutes each week. The exercise should increase your heart rate and make you sweat (moderate-intensity exercise).  Do strengthening exercises at least twice a week. This is in addition to the moderate-intensity exercise.  Spend less time sitting. Even light physical activity can be beneficial. Watch cholesterol and blood lipids Have your blood tested for lipids and cholesterol at 67 years of age, then have this test every 5 years. You may need to have your cholesterol levels checked more often if:  Your lipid or cholesterol levels are high.  You are older than 67 years of age.  You are at high risk for heart disease. What should I know about cancer screening? Many types of cancers can be detected early and may often be prevented. Depending on your health history and family history, you may need to have cancer screening at various ages. This may include screening for:  Colorectal cancer.  Prostate cancer.  Skin cancer.  Lung cancer. What should I know about heart disease, diabetes, and high blood pressure? Blood pressure and heart disease  High blood pressure causes heart disease and increases the risk of stroke. This is more likely to develop in people who have high blood pressure readings, are of African descent, or are overweight.  Talk with your health care provider about your target blood pressure readings.  Have your blood pressure checked: ? Every 3-5 years if you are 18-35 years of age. ? Every year if you are 20 years old or older.  If you are between the ages of 61 and 39 and are a current or former smoker, ask your health care  provider if you should have a one-time screening for abdominal aortic aneurysm (AAA). Diabetes Have regular diabetes screenings. This checks your fasting blood sugar level. Have the screening done:  Once every three years after age 51 if you are at a normal weight and have a low risk for diabetes.  More often and at a younger age if you are overweight or have a high risk for diabetes. What should I know about preventing infection? Hepatitis B If you have a higher risk for hepatitis B, you should be screened for this virus. Talk with your health care provider to find out if you are at risk for hepatitis B infection. Hepatitis C Blood testing is recommended for:  Everyone born from [birth year] through 1967.  Anyone with known risk factors for hepatitis C. Sexually transmitted infections (STIs)  You should be screened each year for STIs, including gonorrhea and chlamydia, if: ? You are sexually active and are younger than 67 years of age. ? You are older than 67 years  of age and your health care provider tells you that you are at risk for this type of infection. ? Your sexual activity has changed since you were last screened, and you are at increased risk for chlamydia or gonorrhea. Ask your health care provider if you are at risk.  Ask your health care provider about whether you are at high risk for HIV. Your health care provider may recommend a prescription medicine to help prevent HIV infection. If you choose to take medicine to prevent HIV, you should first get tested for HIV. You should then be tested every 3 months for as long as you are taking the medicine. Follow these instructions at home: Lifestyle  Do not use any products that contain nicotine or tobacco, such as cigarettes, e-cigarettes, and chewing tobacco. If you need help quitting, ask your health care provider.  Do not use street drugs.  Do not share needles.  Ask your health care provider for help if you need support or  information about quitting drugs. Alcohol use  Do not drink alcohol if your health care provider tells you not to drink.  If you drink alcohol: ? Limit how much you have to 0-2 drinks a day. ? Be aware of how much alcohol is in your drink. In the [place] one drink equals one 12 oz bottle of beer (355 mL), one 5 oz glass of wine (148 mL), or one 1 oz glass of hard liquor (44 mL). General instructions  Schedule regular health, dental, and eye exams.  Stay current with your vaccines.  Tell your health care provider if: ? You often feel depressed. ? You have ever been abused or do not feel safe at home. Summary  Adopting a healthy lifestyle and getting preventive care are important in promoting health and wellness.  Follow your health care provider's instructions about healthy diet, exercising, and getting tested or screened for diseases.  Follow your health care provider's instructions on monitoring your cholesterol and blood pressure. This information is not intended to replace advice given to you by your health care provider. Make sure you discuss any questions you have with your health care provider. Document Revised: 06/23/2018 Document Reviewed: 06/23/2018 Elsevier Patient Education  2021 [employer].

## 2020-11-16 NOTE — Progress Notes (Signed)
Internal Medicine Clinic Attending  Case discussed with Dr. Chen at the time of the visit.  We reviewed the AWV findings.  I agree with the assessment, diagnosis, and plan of care documented in the AWV note.     

## 2020-11-17 ENCOUNTER — Other Ambulatory Visit (HOSPITAL_COMMUNITY): Payer: Self-pay | Admitting: Internal Medicine

## 2020-11-17 DIAGNOSIS — I5022 Chronic systolic (congestive) heart failure: Secondary | ICD-10-CM

## 2020-12-08 ENCOUNTER — Encounter: Payer: Self-pay | Admitting: *Deleted

## 2020-12-25 ENCOUNTER — Encounter: Payer: Self-pay | Admitting: Student

## 2020-12-25 ENCOUNTER — Other Ambulatory Visit: Payer: Self-pay

## 2020-12-25 ENCOUNTER — Ambulatory Visit (INDEPENDENT_AMBULATORY_CARE_PROVIDER_SITE_OTHER): Payer: Medicare Other | Admitting: Student

## 2020-12-25 VITALS — BP 127/63 | HR 66 | Temp 98.7°F | Ht 60.0 in | Wt 175.9 lb

## 2020-12-25 DIAGNOSIS — E669 Obesity, unspecified: Secondary | ICD-10-CM | POA: Insufficient documentation

## 2020-12-25 DIAGNOSIS — Z0000 Encounter for general adult medical examination without abnormal findings: Secondary | ICD-10-CM

## 2020-12-25 DIAGNOSIS — Z1211 Encounter for screening for malignant neoplasm of colon: Secondary | ICD-10-CM

## 2020-12-25 DIAGNOSIS — I428 Other cardiomyopathies: Secondary | ICD-10-CM | POA: Diagnosis not present

## 2020-12-25 DIAGNOSIS — I10 Essential (primary) hypertension: Secondary | ICD-10-CM

## 2020-12-25 DIAGNOSIS — Z8639 Personal history of other endocrine, nutritional and metabolic disease: Secondary | ICD-10-CM | POA: Diagnosis not present

## 2020-12-25 DIAGNOSIS — E785 Hyperlipidemia, unspecified: Secondary | ICD-10-CM

## 2020-12-25 DIAGNOSIS — F17200 Nicotine dependence, unspecified, uncomplicated: Secondary | ICD-10-CM

## 2020-12-25 DIAGNOSIS — Z1159 Encounter for screening for other viral diseases: Secondary | ICD-10-CM | POA: Diagnosis not present

## 2020-12-25 LAB — POCT GLYCOSYLATED HEMOGLOBIN (HGB A1C): Hemoglobin A1C: 5.7 % — AB (ref 4.0–5.6)

## 2020-12-25 LAB — GLUCOSE, CAPILLARY: Glucose-Capillary: 109 mg/dL — ABNORMAL HIGH (ref 70–99)

## 2020-12-25 MED ORDER — DOXAZOSIN MESYLATE 1 MG PO TABS: 2.0000 mg | ORAL_TABLET | Freq: Every day | ORAL | 1 refills | Status: DC

## 2020-12-25 MED ORDER — HYDRALAZINE HCL 100 MG PO TABS: 100.0000 mg | ORAL_TABLET | Freq: Three times a day (TID) | ORAL | 2 refills | Status: DC

## 2020-12-25 NOTE — Assessment & Plan Note (Addendum)
-  Colon cancer screening:Last colonoscopy in 02/2009. Referral placed at last visit in 03/2020 however patient never scheduled. Fit test ordered and patient picked up kit at lab today.  -Low dose CT for lung cancer screening: Patient has 25-pack-year smoking history. Counseled regarding recommendation to get annual LDCT for lung cancer surveillance, and he would like to discuss further at his next appointment. - AAA screening: patient is 65 yo and has a smoking history. Discussed his eligibility for AAA screening, and he declines at this time, would like to discuss at his next visit.  - Hepatitis C screening: patient screened for HCV today --> negative. - Shingles vaccine: patient declines order being sent to his pharmacy, states he will consider at future appointments.

## 2020-12-25 NOTE — Progress Notes (Signed)
Office Visit   Patient ID: Devon Vega, male    DOB: 1956-01-22, 65 y.o.   MRN: [id number]   PCP: Alexandria Lodge, MD   Subjective:   CC: tobacco use disorder, HTN, healthcare maintenance  HPI:  Devon Vega is a 65 y.o. man with history as below who presents to clinic for the above chief complaints. His last clinic visit was on 10/11/20.   To see the details of this patient's management of their acute and chronic problems, please refer to the Assessment & Plan under the Encounters tab.   Review of Systems:   Review of Systems  Constitutional:  Negative for chills, fever, malaise/fatigue and weight loss.  HENT:  Negative for congestion and sore throat.   Cardiovascular:  Negative for chest pain and leg swelling.  Gastrointestinal:  Negative for abdominal pain, blood in stool, nausea and vomiting.  Musculoskeletal:  Negative for myalgias.  Neurological:  Negative for dizziness, weakness and headaches.   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   Nerve compression 12/09/2018   Right shoulder pain 03/25/2018   Sinus arrhythmia 05/24/2014   Tobacco abuse    failed zyban   Weight gain       ACTIVE MEDICATIONS   Outpatient Medications Prior to Visit  Medication Sig Dispense Refill   amLODipine (NORVASC) 10 MG tablet Take 1 tablet by mouth daily 90 tablet 1   aspirin EC 81 MG tablet Take 81 mg by mouth daily.     carvedilol  (COREG) 25 MG tablet TAKE 1 TABLET(25 MG) BY MOUTH TWICE DAILY WITH A MEAL 180 tablet 3   fluticasone (FLONASE) 50 MCG/ACT nasal spray Place 1 spray into both nostrils daily as needed for allergies or rhinitis. 256 each 2   isosorbide mononitrate (IMDUR) 30 MG 24 hr tablet TAKE 1 TABLET(30 MG) BY MOUTH DAILY 30 tablet 2   rosuvastatin (CRESTOR) 10 MG tablet Take 1 tablet (10 mg total) by mouth daily. 90 tablet 1   sacubitril-valsartan (ENTRESTO) 97-103 MG Take 1 tablet by mouth 2 (two) times daily. 60 tablet 5   spironolactone (ALDACTONE) 25 MG tablet TAKE 1 TABLET(25 MG) BY MOUTH DAILY 90 tablet 1   doxazosin (CARDURA) 1 MG tablet Take 2 tablets (2 mg total) by mouth daily. 60 tablet 6   hydrALAZINE (APRESOLINE) 100 MG tablet Take 1 tablet (100 mg total) by mouth 3 (three) times daily. 90 tablet 3   No facility-administered medications prior to visit.   Objective:   BP 127/63   Pulse 66   Temp 98.7 F (37.1 C) (Oral)   Ht 5' (1.524 m)   Wt 175 lb 14.4 oz (79.8 kg)   SpO2 100%   BMI 34.35 kg/m  Wt Readings from Last 3 Encounters:  12/25/20 175 lb 14.4 oz (79.8 kg)  10/11/20 173 lb 4.8 oz (78.6 kg)  04/12/20 172 lb 8 oz (78.2  kg)   BP Readings from Last 3 Encounters:  12/25/20 127/63  10/11/20 132/62  04/12/20 131/71   Constitutional: very pleasant, well-appearing man sitting in chair, in no acute distress HENT: normocephalic atraumatic, mucous membranes moist Eyes: conjunctiva non-erythematous Neck: supple Cardiovascular: regular rate and rhythm, no m/r/g, no lower extremity edema Pulmonary/Chest: normal work of breathing on room air, lungs clear to auscultation bilaterally Abdominal: soft, non-tender, non-distended MSK: normal bulk and tone Neurological: alert & oriented x 3, normal gait Skin: warm and dry Psych: normal mood and affect  Health Maintenance:   Health Maintenance  Topic Date Due   Hepatitis C Screening  Never done   COLON CANCER SCREENING ANNUAL FOBT   Never done   Zoster Vaccines- Shingrix (1 of 2) Never done   COLONOSCOPY (Pts 45-59yr Insurance coverage will need to be confirmed)  03/09/2019   COVID-19 Vaccine (4 - Booster for Pfizer series) 10/13/2020   INFLUENZA VACCINE  02/11/2021   PNA vac Low Risk Adult (2 of 2 - PPSV23) 10/11/2021   TETANUS/TDAP  05/06/2024   HPV VACCINES  Aged Out    Assessment & Plan:   Problem List Items Addressed This Visit       Cardiovascular and Mediastinum   Essential hypertension (Chronic)    BP today initially 143/66 then 127/63 on repeat.    Reports adherence to his medications. Denies headache, dizziness, lightheadedness, dyspnea, lower extremity edema.   His last BMP was on 04/12/20. BMP Latest Ref Rng & Units 04/12/2020 03/28/2019 06/22/2018  Glucose 65 - 99 mg/dL 117(H) 113(H) 117(H)  BUN 8 - 27 mg/dL 9 6(L) 6(L)  Creatinine 0.76 - 1.27 mg/dL 0.92 0.86 1.02  BUN/Creat Ratio 10 - 24 10 - -  Sodium 134 - 144 mmol/L 141 140 140  Potassium 3.5 - 5.2 mmol/L 4.1 4.1 3.9  Chloride 96 - 106 mmol/L 105 107 106  CO2 20 - 29 mmol/L '20 25 25  ' Calcium 8.6 - 10.2 mg/dL 9.1 9.7 9.4   Plan:   - continue Hydralazine 100 mg three times daily - continue Amlodipine 10 mg daily - continue Carvedilol 25 mg twice daily - continue Imdur 30 mg daily - continue Doxazosin 2 mg once daily (patient states he takes 1 mg twice daily) - continue Entresto 97-103 twice daily - continue Spironolactone 25 mg daily   Follow-up in 3 months for BP check and BMP.       Relevant Medications   doxazosin (CARDURA) 1 MG tablet   hydrALAZINE (APRESOLINE) 100 MG tablet   N[place]mic [place])    Patient reports he has been doing well since our last visit 3 months ago. He denies chest pain, shortness of breath, lower extremity edema, orthopnea. His exam is unremarkable.   Plan: No changes to his medication regimen.  - continue Hydralazine 100 mg three times daily - continue Amlodipine 10 mg  daily - continue Carvedilol 25 mg twice daily - continue Imdur 30 mg daily - continue Doxazosin 2 mg once daily (patient takes 1 mg twice daily) - continue Entresto 97-103 twice daily - continue Spironolactone 25 mg daily   Follow-up in 3 months for repeat BMP.       Relevant Medications   doxazosin (CARDURA) 1 MG tablet   hydrALAZINE (APRESOLINE) 100 MG tablet     Other   Healthcare maintenance (Chronic)    - Colon cancer screening: Last colonoscopy in 02/2009. Referral placed at last visit in 03/2020 however patient never scheduled. Fit  test ordered and patient picked up kit at lab today.  - Low dose CT for lung cancer screening: Patient has 25-pack-year smoking history. Counseled regarding recommendation to get annual LDCT for lung cancer surveillance, and he would like to discuss further at his next appointment. - AAA screening: patient is 65 yo and has a smoking history. Discussed his eligibility for AAA screening, and he declines at this time, would like to discuss at his next visit.  - Hepatitis C screening: patient screened for HCV today. - Shingles vaccine: patient declines order being sent to his pharmacy, states he will consider at future appointments.       Hyperlipidemia (Chronic)    Last lipid panel 04/12/20 was wnl. - continue rosuvastatin 10 mg daily - repeat lipid panel at 25-monthfollow-up       Relevant Medications   doxazosin (CARDURA) 1 MG tablet   hydrALAZINE (APRESOLINE) 100 MG tablet   Tobacco use disorder - Primary (Chronic)    Patient reports he has patches and gum which he got from the [place] quit line, however he has not yet tried them. We had a long discussion about his motivation for quitting (to improve his health, live longer) and barriers to quitting (living alone without much to do, grew up around smoking). He reports he once quit for 7 months by using nicotine patches but "fell off the wagon." We discussed that the road to quitting can be bumpy.  Discussed that there are other quitting aids, such as medication, and he states he would still like to try the combination of patch and gum.   Assessment/Plan: Patient has been in the contemplative/action stage of change for the last ~3 months. Today we focused on what motivates him to quit. Also discussed indication for low-dose CT scan and abdominal ultrasound to screen for lung cancer and AAA, respectively. He would like to think further about these and address at next visit. - patient has nicotine patches and gum - follow-up progress at 3106-monthollow-up, discuss low-dose CT and AAA screening - If still no progress, discuss medication aids further       Colon cancer screening    Patient referred for colonoscopy in 03/2020 however did not schedule. Given the long wait times for screening colonoscopy, he is agreeable to Fit testing. He picked up the test kit from the lab today.       Relevant Orders   Fecal occult blood, imunochemical   Obesity (BMI 30.0-34.9)    Patient reports he has gained weight over the last couple of years which he attributes to increased snacking and eating more sweets. He states these habits developed early on in the pandemic and have lingered. He states he feels best at 165 lb, and today he is 175 lb. On review of his weights, he has gained this 10 lb over the last 2 years.   We discussed dietary and lifestyle modifications. Also discussed obtaining another hemoglobin A1c today (last checked 12/2016: 6.0%) given his obesity, weight gain, and history of hyperglycemia.   Plan: - recommend diet and lifestyle changes - Hemoglobin A1c --> 5.7%. Discussed with patient who will work on implementing diet and lifestyle changes.        Relevant Orders   POC Hbg A1C (Completed)   Other Visit Diagnoses     Need for hepatitis C screening test       Relevant Orders   Hepatitis C antibody   Hx of hyperglycemia  Relevant Orders   POC Hbg A1C (Completed)   Glucose,  capillary (Completed)        Return in about 3 months (around 03/27/2021) for Smoking cessation f/u, HTN, healthcare maintenance.   Pt discussed with Dr. Evette Doffing.  Alexandria Lodge, MD Internal Medicine Resident, PGY-1 [facility] Pager: [phone] 4:03 PM, 12/25/2020

## 2020-12-25 NOTE — Assessment & Plan Note (Addendum)
Patient reports he has gained weight over the last couple of years which he attributes to increased snacking and eating more sweets. He states these habits developed early on in the pandemic and have lingered. He states he feels best at 165 lb, and today he is 175 lb. On review of his weights, he has gained this 10 lb over the last 2 years.   We discussed dietary and lifestyle modifications. Also discussed obtaining another hemoglobin A1c today (last checked 12/2016: 6.0%) given his obesity, weight gain, and history of hyperglycemia.   Plan: - recommend diet and lifestyle changes - Hemoglobin A1c --> 5.7%. Discussed with patient who will work on implementing diet and lifestyle changes.

## 2020-12-25 NOTE — Assessment & Plan Note (Signed)
BP today initially 143/66 then 127/63 on repeat.   Reports adherence to his medications. Denies headache, dizziness, lightheadedness, dyspnea, lower extremity edema.   His last BMP was on 04/12/20. BMP Latest Ref Rng & Units 04/12/2020 03/28/2019 06/22/2018  Glucose 65 - 99 mg/dL 117(H) 113(H) 117(H)  BUN 8 - 27 mg/dL 9 6(L) 6(L)  Creatinine 0.76 - 1.27 mg/dL 0.92 0.86 1.02  BUN/Creat Ratio 10 - 24 10 - -  Sodium 134 - 144 mmol/L 141 140 140  Potassium 3.5 - 5.2 mmol/L 4.1 4.1 3.9  Chloride 96 - 106 mmol/L 105 107 106  CO2 20 - 29 mmol/L 20 25 25   Calcium 8.6 - 10.2 mg/dL 9.1 9.7 9.4   Plan:   - continue Hydralazine 100 mg three times daily - continue Amlodipine 10 mg daily - continue Carvedilol 25 mg twice daily - continue Imdur 30 mg daily - continue Doxazosin 2 mg once daily (patient states he takes 1 mg twice daily) - continue Entresto 97-103 twice daily - continue Spironolactone 25 mg daily  Follow-up in 3 months for BP check and BMP.

## 2020-12-25 NOTE — Assessment & Plan Note (Signed)
Patient reports he has been doing well since our last visit 3 months ago. He denies chest pain, shortness of breath, lower extremity edema, orthopnea. His exam is unremarkable.   Plan: No changes to his medication regimen.  - continue Hydralazine 100 mg three times daily - continue Amlodipine 10 mg daily - continue Carvedilol 25 mg twice daily - continue Imdur 30 mg daily - continue Doxazosin 2 mg once daily (patient takes 1 mg twice daily) - continue Entresto 97-103 twice daily - continue Spironolactone 25 mg daily  Follow-up in 3 months for repeat BMP.

## 2020-12-25 NOTE — Assessment & Plan Note (Signed)
Patient reports he has patches and gum which he got from the [place] quit line, however he has not yet tried them. We had a long discussion about his motivation for quitting (to improve his health, live longer) and barriers to quitting (living alone without much to do, grew up around smoking). He reports he once quit for 7 months by using nicotine patches but "fell off the wagon." We discussed that the road to quitting can be bumpy. Discussed that there are other quitting aids, such as medication, and he states he would still like to try the combination of patch and gum.   Assessment/Plan: Patient has been in the contemplative/action stage of change for the last ~3 months. Today we focused on what motivates him to quit. Also discussed indication for low-dose CT scan and abdominal ultrasound to screen for lung cancer and AAA, respectively. He would like to think further about these and address at next visit. - patient has nicotine patches and gum - follow-up progress at 57-month follow-up, discuss low-dose CT and AAA screening - If still no progress, discuss medication aids further

## 2020-12-25 NOTE — Assessment & Plan Note (Signed)
Patient referred for colonoscopy in 03/2020 however did not schedule. Given the long wait times for screening colonoscopy, he is agreeable to Fit testing. He picked up the test kit from the lab today.

## 2020-12-25 NOTE — Patient Instructions (Addendum)
Devon Vega,   Thank you for your visit to the [facility] today. It was a pleasure seeing you. Today we discussed the following:  1) Tobacco use - Great job getting the patches and gum. Next step is using them!  - I'd like you to follow-up in 3 months to check in on how the quitting is going. - It is highly recommended that you get screened for abdominal aortic aneurysm (AAA) with an ultrasound as well as lung cancer with a low-dose CT scan. Your new PCP will discuss this at your upcoming appointment, but if you decide before then that you would like to do these, please call the clinic.   2) Healthcare maintenance - We are screening you for hepatitis C today - I am checking your hemoglobin A1c to screen for pre-diabetes/diabetes - You will pick up the kit for Fit testing, which is a screening test for colon cancer. If the test comes back positive, the next step will be a colonoscopy.  2) Medication refills: I refilled your medications which need refilling   It has been an honor being your primary care doctor over the last year. You have been assigned a new PCP, Dr. Benancio Deeds. If you haven't already, you should be receiving a letter in the mail with this information. Please call the clinic on or after July 6 to schedule your next follow-up appointment.  We would like to see you back in 3 months. Please bring all of your medications with you.   If you have any questions or concerns, please call our clinic at [phone] between 9am-5pm. Outside of these hours, call [phone] and ask for the internal medicine resident on call. If you feel you are having a medical emergency please call 911.

## 2020-12-25 NOTE — Assessment & Plan Note (Signed)
Last lipid panel 04/12/20 was wnl. - continue rosuvastatin 10 mg daily - repeat lipid panel at 8-month follow-up

## 2020-12-26 LAB — HEPATITIS C ANTIBODY: Hep C Virus Ab: 0.1 s/co ratio (ref 0.0–0.9)

## 2020-12-26 NOTE — Progress Notes (Signed)
Internal Medicine Clinic Attending  Case discussed with Dr. Watson  At the time of the visit.  We reviewed the resident's history and exam and pertinent patient test results.  I agree with the assessment, diagnosis, and plan of care documented in the resident's note.  

## 2021-01-16 ENCOUNTER — Other Ambulatory Visit (HOSPITAL_COMMUNITY): Payer: Self-pay | Admitting: Internal Medicine

## 2021-02-21 ENCOUNTER — Other Ambulatory Visit (HOSPITAL_COMMUNITY): Payer: Self-pay | Admitting: Internal Medicine

## 2021-03-27 ENCOUNTER — Other Ambulatory Visit (HOSPITAL_COMMUNITY): Payer: Self-pay | Admitting: Internal Medicine

## 2021-04-02 ENCOUNTER — Other Ambulatory Visit: Payer: Self-pay

## 2021-04-02 DIAGNOSIS — E7849 Other hyperlipidemia: Secondary | ICD-10-CM

## 2021-04-02 DIAGNOSIS — I10 Essential (primary) hypertension: Secondary | ICD-10-CM

## 2021-04-02 DIAGNOSIS — I428 Other cardiomyopathies: Secondary | ICD-10-CM

## 2021-04-02 MED ORDER — SPIRONOLACTONE 25 MG PO TABS: ORAL_TABLET | ORAL | 0 refills | Status: DC

## 2021-04-02 NOTE — Telephone Encounter (Signed)
Will refill this patient's medicine, however, please call this patient to schedule an appt to be seen with his PCP, Dr. Jeanice Lim in a couple weeks.

## 2021-04-03 ENCOUNTER — Other Ambulatory Visit: Payer: Self-pay | Admitting: Internal Medicine

## 2021-04-03 DIAGNOSIS — I10 Essential (primary) hypertension: Secondary | ICD-10-CM

## 2021-04-03 DIAGNOSIS — E7849 Other hyperlipidemia: Secondary | ICD-10-CM

## 2021-04-03 MED ORDER — AMLODIPINE BESYLATE 10 MG PO TABS: ORAL_TABLET | ORAL | 0 refills | Status: DC

## 2021-04-03 MED ORDER — ROSUVASTATIN CALCIUM 10 MG PO TABS: 10.0000 mg | ORAL_TABLET | Freq: Every day | ORAL | 0 refills | Status: DC

## 2021-04-11 ENCOUNTER — Ambulatory Visit (INDEPENDENT_AMBULATORY_CARE_PROVIDER_SITE_OTHER): Payer: Medicare Other | Admitting: Student

## 2021-04-11 ENCOUNTER — Encounter: Payer: Self-pay | Admitting: Student

## 2021-04-11 ENCOUNTER — Other Ambulatory Visit: Payer: Self-pay

## 2021-04-11 VITALS — BP 134/62 | HR 75 | Temp 99.2°F | Ht 65.0 in | Wt 173.8 lb

## 2021-04-11 DIAGNOSIS — J302 Other seasonal allergic rhinitis: Secondary | ICD-10-CM

## 2021-04-11 DIAGNOSIS — I5022 Chronic systolic (congestive) heart failure: Secondary | ICD-10-CM

## 2021-04-11 DIAGNOSIS — I428 Other cardiomyopathies: Secondary | ICD-10-CM | POA: Diagnosis not present

## 2021-04-11 DIAGNOSIS — I10 Essential (primary) hypertension: Secondary | ICD-10-CM

## 2021-04-11 DIAGNOSIS — E785 Hyperlipidemia, unspecified: Secondary | ICD-10-CM

## 2021-04-11 DIAGNOSIS — M25552 Pain in left hip: Secondary | ICD-10-CM | POA: Diagnosis not present

## 2021-04-11 MED ORDER — DOXAZOSIN MESYLATE 1 MG PO TABS: 2.0000 mg | ORAL_TABLET | Freq: Every day | ORAL | 1 refills | Status: DC

## 2021-04-11 MED ORDER — CARVEDILOL 25 MG PO TABS: ORAL_TABLET | ORAL | 3 refills | Status: DC

## 2021-04-11 NOTE — Patient Instructions (Addendum)
It was a pleasure seeing you in clinic. Today we discussed:   Hip pain Please continue tylenol for pain Try warm compresses and massaging the area  If not improving in 1 week please return for reevaluation   We will check some blood work today  If you have any questions or concerns, please call our clinic at [phone] between 9am-5pm and after hours call [phone] and ask for the internal medicine resident on call. If you feel you are having a medical emergency please call 911.   Thank you, we look forward to helping you remain healthy!Contact a health care provider if: Your pain and stiffness continue or get worse. Your leg or hip becomes weak. You have changes in your bowel function or bladder function. Summary Piriformis syndrome is a condition that can cause pain, tingling, and numbness in your buttocks and down the back of your leg. You may try applying heat or ice to relieve the pain. Do not sit for long periods. Get up and walk around every 20 minutes or as often as told by your health care provider. This information is not intended to replace advice given to you by your health care provider. Make sure you discuss any questions you have with your health care provider. Document Revised: 10/21/2018 Document Reviewed: 02/24/2018 Elsevier Patient Education  [place].

## 2021-04-12 LAB — LIPID PANEL
Chol/HDL Ratio: 2.9 ratio (ref 0.0–5.0)
Cholesterol, Total: 126 mg/dL (ref 100–199)
HDL: 44 mg/dL (ref >39)
LDL Chol Calc (NIH): 63 mg/dL (ref 0–99)
Triglycerides: 103 mg/dL (ref 0–149)
VLDL Cholesterol Cal: 19 mg/dL (ref 5–40)

## 2021-04-12 LAB — BMP8+ANION GAP
Anion Gap: 18 mmol/L (ref 10.0–18.0)
BUN/Creatinine Ratio: 11 (ref 10–24)
BUN: 12 mg/dL (ref 8–27)
CO2: 19 mmol/L — ABNORMAL LOW (ref 20–29)
Calcium: 9.8 mg/dL (ref 8.6–10.2)
Chloride: 100 mmol/L (ref 96–106)
Creatinine, Ser: 1.08 mg/dL (ref 0.76–1.27)
Glucose: 103 mg/dL — ABNORMAL HIGH (ref 70–99)
Potassium: 4.5 mmol/L (ref 3.5–5.2)
Sodium: 137 mmol/L (ref 134–144)
eGFR: 75 mL/min/{1.73_m2} (ref >59)

## 2021-04-14 ENCOUNTER — Other Ambulatory Visit: Payer: Self-pay

## 2021-04-14 ENCOUNTER — Encounter (HOSPITAL_COMMUNITY): Payer: Self-pay | Admitting: Emergency Medicine

## 2021-04-14 ENCOUNTER — Emergency Department (HOSPITAL_COMMUNITY)
Admission: EM | Admit: 2021-04-14 | Discharge: 2021-04-14 | Disposition: A | Discharge status: Home / Self Care | Payer: Medicare Other | Attending: Emergency Medicine | Admitting: Emergency Medicine

## 2021-04-14 ENCOUNTER — Emergency Department (HOSPITAL_COMMUNITY): Payer: Medicare Other

## 2021-04-14 DIAGNOSIS — I110 Hypertensive heart disease with heart failure: Secondary | ICD-10-CM | POA: Diagnosis not present

## 2021-04-14 DIAGNOSIS — J449 Chronic obstructive pulmonary disease, unspecified: Secondary | ICD-10-CM | POA: Diagnosis not present

## 2021-04-14 DIAGNOSIS — Z7982 Long term (current) use of aspirin: Secondary | ICD-10-CM | POA: Insufficient documentation

## 2021-04-14 DIAGNOSIS — I509 Heart failure, unspecified: Secondary | ICD-10-CM | POA: Diagnosis not present

## 2021-04-14 DIAGNOSIS — M5442 Lumbago with sciatica, left side: Secondary | ICD-10-CM | POA: Diagnosis not present

## 2021-04-14 DIAGNOSIS — M79605 Pain in left leg: Secondary | ICD-10-CM | POA: Insufficient documentation

## 2021-04-14 DIAGNOSIS — F17210 Nicotine dependence, cigarettes, uncomplicated: Secondary | ICD-10-CM | POA: Insufficient documentation

## 2021-04-14 DIAGNOSIS — M5136 Other intervertebral disc degeneration, lumbar region: Secondary | ICD-10-CM | POA: Diagnosis not present

## 2021-04-14 DIAGNOSIS — Z79899 Other long term (current) drug therapy: Secondary | ICD-10-CM | POA: Diagnosis not present

## 2021-04-14 DIAGNOSIS — M549 Dorsalgia, unspecified: Secondary | ICD-10-CM | POA: Diagnosis present

## 2021-04-14 DIAGNOSIS — M5432 Sciatica, left side: Secondary | ICD-10-CM

## 2021-04-14 MED ORDER — CELECOXIB 200 MG PO CAPS: 200.0000 mg | ORAL_CAPSULE | Freq: Two times a day (BID) | ORAL | 0 refills | Status: DC

## 2021-04-14 MED ORDER — KETOROLAC TROMETHAMINE 60 MG/2ML IM SOLN
30.0000 mg | Freq: Once | INTRAMUSCULAR | Status: AC
  Administered 2021-04-14: 30 mg via INTRAMUSCULAR
  Filled 2021-04-14: qty 2

## 2021-04-14 MED ORDER — DEXAMETHASONE SODIUM PHOSPHATE 10 MG/ML IJ SOLN
10.0000 mg | Freq: Once | INTRAMUSCULAR | Status: AC
  Administered 2021-04-14: 10 mg via INTRAMUSCULAR
  Filled 2021-04-14: qty 1

## 2021-04-14 MED ORDER — METHYLPREDNISOLONE 4 MG PO TBPK: ORAL_TABLET | ORAL | 0 refills | Status: DC

## 2021-04-14 MED ORDER — METHOCARBAMOL 500 MG PO TABS: 500.0000 mg | ORAL_TABLET | Freq: Two times a day (BID) | ORAL | 0 refills | Status: DC | PRN

## 2021-04-14 NOTE — ED Provider Notes (Signed)
[facility] EMERGENCY DEPARTMENT Provider Note   CSN: [id number] Arrival date & time: 04/14/21  1056     History No chief complaint on file.   Devon Vega is a 65 y.o. male who presents emergency department with a chief complaint of left leg and back pain.  This has been ongoing for 1 week.  He was seen at the internal medicine residency clinic where he was diagnosed with piriformis syndrome and told to take Tylenol.  Patient reports pain that radiates down the backside of his leg.  Worse with standing, better with rest.  He states that it is both aching and sharp at times especially when he stands up.  He pain is worse when standing fully upright and he is having to use a cane currently to support himself.  He denies any weakness of the lower extremity, saddle anesthesia, bowel or bladder incontinence, fevers, chills, recent procedures to his back.  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   Nerve compression 12/09/2018   Right shoulder pain 03/25/2018   Sinus arrhythmia 05/24/2014   Tobacco abuse    failed zyban   Weight gain     Patient Active Problem List   Diagnosis Date Noted   Obesity (BMI 30.0-34.9) 12/25/2020   Colon cancer screening 05/30/2019   NICM (nonischemic cardiomyopathy) ([place]) 03/01/2017   LVH (left ventricular hypertrophy) 09/05/2015   Sinus arrhythmia 05/24/2014   Healthcare maintenance  07/30/2013   Degenerative disc disease, cervical 10/02/2010   Hyperlipidemia 07/27/2006   Tobacco use disorder 07/27/2006   Essential hypertension 07/27/2006    Past Surgical History:  Procedure Laterality Date   COLONOSCOPY     EXCISION METACARPAL MASS Right 02/03/2013   Procedure: WIDE RESECTION OF GRANULAR CELL TUMOR;  Surgeon: Cammie Sickle., MD;  Location: [facility];  Service: Orthopedics;  Laterality: Right;   LYMPH NODE BIOPSY  2011   lt axilla-negative   MASS EXCISION Right 01/21/2013   Procedure: BIOPSY OF MASS RIGHT RING FINGER;  Surgeon: Cammie Sickle., MD;  Location: [facility];  Service: Orthopedics;  Laterality: Right;   RIGHT/LEFT HEART CATH AND CORONARY ANGIOGRAPHY N/A 03/02/2017   Procedure: RIGHT/LEFT HEART CATH AND CORONARY ANGIOGRAPHY;  Surgeon: Jolaine Artist, MD;  Location: [facility];  Service: Cardiovascular;  Laterality: N/A;   SKIN FULL THICKNESS GRAFT Right 02/03/2013   Procedure: SKIN GRAFT FULL THICKNESS RIGHT RING FINGER;  Surgeon: Cammie Sickle., MD;  Location: [facility];  Service: Orthopedics;  Laterality: Right;       Family History  Problem Relation Age of Onset   Cancer Father        Lung   Parkinson's disease Father    Renal Disease Brother        On Dialysis   Heart failure Brother  Hypertension Brother    Hypertension Mother    Stroke Brother     Social History   Tobacco Use   Smoking status: Every Day    Packs/day: 0.50    Types: Cigarettes, Cigars    Last attempt to quit: 03/30/2017    Years since quitting: 4.0   Smokeless tobacco: Never   Tobacco comments:    0.5 PPD wants to quit  Substance Use Topics   Alcohol use: Not Currently    Alcohol/week: 0.0 standard drinks    Comment: 2 beers/day   Drug use: Not Currently    Types: Marijuana    Home Medications Prior to Admission medications   Medication Sig Start Date End Date Taking? Authorizing  Provider  celecoxib (CELEBREX) 200 MG capsule Take 1 capsule (200 mg total) by mouth 2 (two) times daily. 04/14/21  Yes Margarita Mail, PA-C  methylPREDNISolone (MEDROL DOSEPAK) 4 MG TBPK tablet Use as directed 04/14/21  Yes Harrel Ferrone, PA-C  amLODipine (NORVASC) 10 MG tablet TAKE 1 TABLET BY MOUTH DAILY 04/03/21   Atway, Rayann N, DO  aspirin EC 81 MG tablet Take 81 mg by mouth daily.    [provider]  carvedilol (COREG) 25 MG tablet TAKE 1 TABLET(25 MG) BY MOUTH TWICE DAILY WITH A MEAL 04/11/21   Iona Beard, MD  doxazosin (CARDURA) 1 MG tablet Take 2 tablets (2 mg total) by mouth daily. 04/11/21   Iona Beard, MD  fluticasone [facility] [place]) 50 MCG/ACT nasal spray Place 1 spray into both nostrils daily as needed for allergies or rhinitis. 10/11/20 01/09/21  Alexandria Lodge, MD  hydrALAZINE (APRESOLINE) 100 MG tablet Take 1 tablet (100 mg total) by mouth 3 (three) times daily. 12/25/20   Alexandria Lodge, MD  isosorbide mononitrate (IMDUR) 30 MG 24 hr tablet TAKE 1 TABLET(30 MG) BY MOUTH DAILY 03/27/21   Bensimhon, Shaune Pascal, MD  rosuvastatin (CRESTOR) 10 MG tablet TAKE 1 TABLET(10 MG) BY MOUTH DAILY 04/03/21   Atway, Rayann N, DO  sacubitril-valsartan (ENTRESTO) 97-103 MG Take 1 tablet by mouth 2 (two) times daily. 11/15/20   Madalyn Rob, MD  spironolactone (ALDACTONE) 25 MG tablet TAKE 1 TABLET(25 MG) BY MOUTH DAILY 04/02/21   Atway, Rayann N, DO    Allergies    Codeine and Lisinopril  Review of Systems   Review of Systems Ten systems reviewed and are negative for acute change, except as noted in the HPI.   Physical Exam Updated Vital Signs BP (!) 156/80   Pulse 66   Temp 98.4 F (36.9 C) (Oral)   Resp 18   SpO2 100%   Physical Exam Vitals and nursing note reviewed.  Constitutional:      General: He is not in acute distress.    Appearance: He is well-developed. He is not diaphoretic.  HENT:     Head: Normocephalic and atraumatic.  Eyes:     General: No scleral icterus.     Conjunctiva/sclera: Conjunctivae normal.  Cardiovascular:     Rate and Rhythm: Normal rate and regular rhythm.     Heart sounds: Normal heart sounds.  Pulmonary:     Effort: Pulmonary effort is normal. No respiratory distress.     Breath sounds: Normal breath sounds.  Abdominal:     Palpations: Abdomen is soft.     Tenderness: There is no abdominal tenderness.  Musculoskeletal:     Cervical back: Normal range of motion and neck supple.     Comments: Patient appears to be in mild to moderate  pain, antalgic gait noted. Lumbosacral spine area reveals no local tenderness or mass. Painful and reduced LS ROM noted. Straight leg raise is positive at 45 degrees on left. DTR's, motor strength and sensation normal, including heel and toe gait.  Peripheral pulses are palpable.   Skin:    General: Skin is warm and dry.  Neurological:     Mental Status: He is alert.  Psychiatric:        Behavior: Behavior normal.    ED Results / Procedures / Treatments   Labs (all labs ordered are listed, but only abnormal results are displayed) Labs Reviewed - No data to display  EKG None  Radiology DG Lumbar Spine 2-3 Views  Result Date: 04/14/2021 CLINICAL DATA:  Back pain with sciatica. EXAM: LUMBAR SPINE - 2-3 VIEW COMPARISON:  None. FINDINGS: No fracture or traumatic malalignment. Mild lower lumbar facet degenerative changes. Multilevel degenerative disc disease. Calcified atherosclerosis in the distal abdominal aorta and iliac vessels. IMPRESSION: 1. Degenerative changes as above. 2. Calcified atherosclerosis in the distal abdominal aorta and iliac vessels. Electronically Signed   By: Dorise Bullion III M.D.   On: 04/14/2021 12:54    Procedures Procedures   Medications Ordered in ED Medications  ketorolac (TORADOL) injection 30 mg (30 mg Intramuscular Given 04/14/21 1603)  dexamethasone (DECADRON) injection 10 mg (10 mg Intramuscular Given 04/14/21 1604)    ED Course  I have reviewed the triage  vital signs and the nursing notes.  Pertinent labs & imaging results that were available during my care of the patient were reviewed by me and considered in my medical decision making (see chart for details).    MDM Rules/Calculators/A&P                           Patient with symptoms of radiculopathy likely sciatica.  Plain films of the lumbar spine ordered in triage show degenerative disc disease.  Patient given IM Decadron and Toradol here.  He will be discharged with a prednisone Dosepak and Celebrex.  Patient has a history of hypertension but has no known history of coronary artery disease or MI.  He has no red flag symptoms suggestive of cauda equina, epidural abscess, discitis.  Patient is well-appearing.  Discussed outpatient follow-up and return precautions. Final Clinical Impression(s) / ED Diagnoses Final diagnoses:  Sciatica of left side  DDD (degenerative disc disease), lumbar    Rx / DC Orders ED Discharge Orders          Ordered    celecoxib (CELEBREX) 200 MG capsule  2 times daily        04/14/21 1555    methylPREDNISolone (MEDROL DOSEPAK) 4 MG TBPK tablet        04/14/21 1555             Margarita Mail, PA-C 04/14/21 1605    Blanchie Dessert, MD 04/14/21 1905

## 2021-04-14 NOTE — ED Provider Notes (Signed)
Emergency Medicine Provider Triage Evaluation Note  Devon Vega , a 65 y.o. male  was evaluated in triage.  Pt complains of left leg pain that initially starts in the left hip and radiates down the posterior leg.  Is been dealing with this for a week and has been constant worsening.  Was seen by internal medicine and was diagnosed with piriformis syndrome.  He denies any numbness to the leg.  He states that it feels better when he leans forward while walking.  Unable to walk straight up.  Normally does not use a cane.  Review of Systems  Positive:  Negative: See above   Physical Exam  BP (!) 150/89 (BP Location: Left Arm)   Pulse 89   Temp 98.3 F (36.8 C) (Oral)   Resp 14   SpO2 100%  Gen:   Awake, no distress   Resp:  Normal effort  MSK:   Moves extremities without difficulty  Other:  5/5 strength in the left leg.  Normal sensation.  Medical Decision Making  Medically screening exam initiated at 11:48 AM.  Appropriate orders placed.  Devon Vega was informed that the remainder of the evaluation will be completed by another provider, this initial triage assessment does not replace that evaluation, and the importance of remaining in the ED until their evaluation is complete.     Devon Bright [place], PA-C 04/14/21 [place], MD 04/15/21 [phone]

## 2021-04-14 NOTE — Discharge Instructions (Addendum)
SEEK IMMEDIATE MEDICAL ATTENTION IF: New numbness, tingling, weakness, or problem with the use of your arms or legs.  Severe back pain not relieved with medications.  Change in bowel or bladder control.  Increasing pain in any areas of the body (such as chest or abdominal pain).  Shortness of breath, dizziness or fainting.  Nausea (feeling sick to your stomach), vomiting, fever, or sweats.  

## 2021-04-14 NOTE — ED Triage Notes (Signed)
C/o L hip pain that radiates down L leg x 1 week.  No known injury.  Ambulatory with cane- doesn't usually use a cane.

## 2021-04-16 DIAGNOSIS — M25552 Pain in left hip: Secondary | ICD-10-CM | POA: Insufficient documentation

## 2021-04-16 NOTE — Assessment & Plan Note (Signed)
Patient presents for 1 day of left hip discomfort and change in sensation.  States this started this morning.  Last night patient reports he took some bisacodyl for diarrhea.  Reports several episodes of diarrhea and spent several hours sitting on the toilet.  Since then he has had a strange sensation of the left hip.  He has difficulty describing if this is pain or change of sensation.  He does note that the diarrhea has resolved this morning.  He notes he had similar symptoms after having diarrhea in the past and that his symptoms improved on their own without seeking medical attention.  He denies back pain, fever, chills, bowel or bladder incontinence, numbness, tingling, weakness.  On exam he has good range of motion and strength of the left hip.  When testing sensation he notes symmetric sensation of bilateral hip and legs.  There is some discomfort with deep palpation of the posterior buttock area.  I suspect his discomfort may be related to sitting on the toilet for a long period of time yesterday.  He has taken Tylenol for this pain without much effect.  He is requesting oxycodone for the pain.  Discussed that his pain is likely due to muscular strain and that narcotics would not be appropriate for the pain.  I expect this this pain will gradually improve with conservative management.  Plan Tylenol, heat, ice, massage to the area to help with pain Patient will follow-up in 2 weeks if pain worsening or not improving.

## 2021-04-16 NOTE — Progress Notes (Signed)
Internal Medicine Clinic Attending  I spoke with the patient. I personally confirmed the key portions of the history and exam documented by Dr. Lisabeth Devoid.  The assessment, diagnosis, and plan were formulated together and I agree with the documentation in the resident's note.

## 2021-04-16 NOTE — Assessment & Plan Note (Signed)
Lipid panel rechecked today.  Continue current management.

## 2021-04-16 NOTE — Assessment & Plan Note (Signed)
BP initially elevated but on repeat blood pressure is 136/62.  He reports compliance with his medications.  We will check BMP today to monitor kidney function.

## 2021-04-16 NOTE — Progress Notes (Signed)
   CC: left hip pain  HPI:  Mr.Mesiah SAKIB NOGUEZ is a 65 y.o. m with history below presents for left hip pain that started this morning. Please refer to problem based charting for further details and assessment and plan of current problem and chronic medical conditions.   Past Medical History:  Diagnosis Date   Alcohol abuse    hx of , sober since 1997   CHF (congestive heart failure) ([place] 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   Nerve compression 12/09/2018   Right shoulder pain 03/25/2018   Sinus arrhythmia 05/24/2014   Tobacco abuse    failed zyban   Weight gain    Review of Systems:  negative as per HPI  Physical Exam:  Vitals:   04/11/21 1514 04/11/21 1527  BP: (!) 154/70 134/62  Pulse: 75 75  Temp: 99.2 F (37.3 C)   TempSrc: Oral   SpO2: 100%   Weight: 173 lb 12.8 oz (78.8 kg)   Height: 5\' 5"  (1.651 m)    Physical Exam Constitutional:      Appearance: Normal appearance.  HENT:     Head: Normocephalic and atraumatic.     Mouth/Throat:     Mouth: Mucous membranes are moist.     Pharynx: Oropharynx is clear.  Eyes:     Conjunctiva/sclera: Conjunctivae normal.     Pupils: Pupils are equal, round, and reactive to light.  Cardiovascular:     Rate and Rhythm: Normal rate and regular rhythm.     Pulses: Normal pulses.     Heart sounds: No murmur heard. Pulmonary:     Effort: Pulmonary effort is normal.     Breath sounds: Normal breath sounds.  Abdominal:     General: Abdomen is flat. Bowel sounds are normal. There is no distension.      Palpations: Abdomen is soft.     Tenderness: There is no abdominal tenderness. There is no guarding.  Musculoskeletal:     Comments: Normal range of motion of the left hip.  No increased pain on flexion extension external or internal rotation.  Negative straight leg raise.  Some mild discomfort in the posterior buttock on the left.  No changes to sensation.  No calf tenderness.  Skin:    General: Skin is warm and dry.     Capillary Refill: Capillary refill takes less than 2 seconds.     Findings: No bruising or rash.  Neurological:     General: No focal deficit present.     Mental Status: He is alert and oriented to person, place, and time.     Motor: No weakness.     Comments: Antalgic gait  Psychiatric:        Mood and Affect: Mood normal.        Behavior: Behavior normal.     Assessment & Plan:   See Encounters Tab for problem based charting.  Patient discussed with Dr.  Saverio Danker

## 2021-04-22 ENCOUNTER — Other Ambulatory Visit (HOSPITAL_COMMUNITY): Payer: Self-pay | Admitting: Internal Medicine

## 2021-05-01 ENCOUNTER — Encounter: Payer: Medicare Other | Admitting: Internal Medicine

## 2021-05-06 ENCOUNTER — Other Ambulatory Visit: Payer: Self-pay

## 2021-05-06 DIAGNOSIS — I10 Essential (primary) hypertension: Secondary | ICD-10-CM

## 2021-05-06 MED ORDER — HYDRALAZINE HCL 100 MG PO TABS: 100.0000 mg | ORAL_TABLET | Freq: Three times a day (TID) | ORAL | 2 refills | Status: DC

## 2021-05-13 ENCOUNTER — Ambulatory Visit (INDEPENDENT_AMBULATORY_CARE_PROVIDER_SITE_OTHER): Payer: Medicare Other | Admitting: Internal Medicine

## 2021-05-13 VITALS — BP 129/74 | HR 73 | Temp 98.4°F | Wt 174.1 lb

## 2021-05-13 DIAGNOSIS — I498 Other specified cardiac arrhythmias: Secondary | ICD-10-CM | POA: Diagnosis not present

## 2021-05-13 DIAGNOSIS — Z1211 Encounter for screening for malignant neoplasm of colon: Secondary | ICD-10-CM

## 2021-05-13 DIAGNOSIS — F17210 Nicotine dependence, cigarettes, uncomplicated: Secondary | ICD-10-CM

## 2021-05-13 DIAGNOSIS — Z23 Encounter for immunization: Secondary | ICD-10-CM | POA: Diagnosis not present

## 2021-05-13 DIAGNOSIS — M25552 Pain in left hip: Secondary | ICD-10-CM

## 2021-05-13 DIAGNOSIS — F17209 Nicotine dependence, unspecified, with unspecified nicotine-induced disorders: Secondary | ICD-10-CM

## 2021-05-13 DIAGNOSIS — I10 Essential (primary) hypertension: Secondary | ICD-10-CM

## 2021-05-13 DIAGNOSIS — Z0000 Encounter for general adult medical examination without abnormal findings: Secondary | ICD-10-CM | POA: Diagnosis not present

## 2021-05-13 DIAGNOSIS — F17200 Nicotine dependence, unspecified, uncomplicated: Secondary | ICD-10-CM

## 2021-05-13 NOTE — Assessment & Plan Note (Signed)
Patient states he has been smoking since the age of 39.  Patient smokes half a pack per day.  Patient states he has nicotine patches and nicotine gum but has not used it yet.  Patient was counseled extensively on daily risks of cigarette smoking and the benefit and importance of quitting.  Patient states when he is ready to quit he will try attempt nicotine replacement therapy.     PLAN: Reevaluate on subsequent visit

## 2021-05-13 NOTE — Patient Instructions (Addendum)
Glad you are feeling better. It was nice meeting you.   Be sure to schedule your colonoscopy

## 2021-05-13 NOTE — Assessment & Plan Note (Signed)
Rate and rhythm controlled on Coreg 25 mg.  Patient denies chest pain or palpitations.  On physical exam, CVS- RRR; S1-S2.  No M/R/G

## 2021-05-13 NOTE — Assessment & Plan Note (Signed)
Patient went to the ED on 04/14/2021 complaining of left hip pain with radiation down the left leg.  Patient denies weakness numbness incontinence.  Plain film lumbar spine revealed DDD.  Patient received IM Decadron and Toradol in the ED.  Patient was discharged with prednisone taper and Celebrex for relief.  Patient states 4 days following discharge patient felt better.  Today patient denies left hip pain at this time.  Patient endorses he no longer takes the prednisone or Celebrex due to resolution of symptoms.   PLAN: Advised to discontinue prednisone. If symptoms resume, take Celebrex PRN for relief.

## 2021-05-13 NOTE — Addendum Note (Signed)
Addended by: Timothy Lasso I on: 05/13/2021 02:48 PM   Modules accepted: Orders, SmartSet

## 2021-05-13 NOTE — Assessment & Plan Note (Signed)
Blood pressure at goal.  Patient currently takes amlodipine 10 mg; Entresto; spironolactone 25 mg; Coreg 25 mg

## 2021-05-13 NOTE — Progress Notes (Signed)
CC: follow up left hip pain;   HPI:  [name] is a 65 y.o. male with a past medical history stated below and presents today for cc listed above. Please see problem based assessment and plan for additional details.  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   Nerve compression 12/09/2018   Right shoulder pain 03/25/2018   Sinus arrhythmia 05/24/2014   Tobacco abuse    failed zyban   Weight gain     Current Outpatient Medications on File Prior to Visit  Medication Sig Dispense Refill   amLODipine (NORVASC) 10 MG tablet TAKE 1 TABLET BY MOUTH DAILY 90 tablet 0   aspirin EC 81 MG tablet Take 81 mg by mouth daily.     carvedilol (COREG) 25 MG tablet TAKE 1 TABLET(25 MG) BY MOUTH TWICE DAILY WITH A MEAL 180 tablet 3   celecoxib (CELEBREX) 200 MG capsule Take 1 capsule (200 mg total) by mouth 2 (two) times daily. 20 capsule 0   doxazosin (CARDURA) 1 MG tablet Take 2 tablets (2 mg total) by mouth daily. 180 tablet 1   fluticasone (FLONASE) 50 MCG/ACT nasal spray Place 1 spray into both nostrils daily as needed for allergies or rhinitis. 256 each 2   hydrALAZINE (APRESOLINE) 100 MG tablet Take 1 tablet (100 mg total) by mouth 3 (three) times daily. 180 tablet 2   isosorbide mononitrate (IMDUR) 30 MG 24 hr tablet TAKE 1 TABLET(30 MG) BY MOUTH DAILY 15 tablet 0   methocarbamol (ROBAXIN) 500 MG tablet Take 1 tablet (500 mg total) by mouth 2  (two) times daily as needed for muscle spasms. 20 tablet 0   rosuvastatin (CRESTOR) 10 MG tablet TAKE 1 TABLET(10 MG) BY MOUTH DAILY 90 tablet 0   sacubitril-valsartan (ENTRESTO) 97-103 MG Take 1 tablet by mouth 2 (two) times daily. 60 tablet 5   spironolactone (ALDACTONE) 25 MG tablet TAKE 1 TABLET(25 MG) BY MOUTH DAILY 90 tablet 0   No current facility-administered medications on file prior to visit.    Family History  Problem Relation Age of Onset   Cancer Father        Lung   Parkinson's disease Father    Renal Disease Brother        On Dialysis   Heart failure Brother    Hypertension Brother    Hypertension Mother    Stroke Brother     Social History   Socioeconomic History   Marital status: Divorced    Spouse name: Not on file   Number of children: 0   Years of education: Not on file   Highest education level: Not on file  Occupational History   Occupation: Retired    Comment: Personal assistant at [employer] Use   Smoking status: Every Day    Packs/day: 0.50    Types: Cigarettes, Cigars    Last attempt to quit:  03/30/2017    Years since quitting: 4.1   Smokeless tobacco: Never   Tobacco comments:    0.5 PPD wants to quit  Substance and Sexual Activity   Alcohol use: Not Currently    Alcohol/week: 0.0 standard drinks    Comment: 2 beers/day   Drug use: Not Currently    Types: Marijuana   Sexual activity: Not on file  Other Topics Concern   Not on file  Social History Narrative   Current smoker-  0.5 ppd x ~ 45 yrs   Alcohol use- ~2 beers/day; 6 pk beer over a weekend   Divorced, married 17 yrs- no children      Current Social History 11/15/2020        Patient lives alone in a one level home with 4 outside steps with handrails       Patient's method of transportation is personal vehicle.      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 [place], BSN, RN-BC       Social Determinants of Health   Financial Resource Strain: Not on file  Food Insecurity: Not on file  Transportation Needs: Not on file  Physical Activity: Not on file  Stress: Not on file  Social Connections: Not on file  Intimate Partner Violence: Not on file    Review of Systems: ROS negative except for what is noted on the assessment and plan.  Vitals:   05/13/21 1311  BP: 129/74  Pulse: 73  Temp: 98.4 F (36.9 C)  TempSrc: Oral  SpO2: 100%  Weight: 174 lb 1.6 oz (79 kg)     Physical Exam: Constitutional: well-appearing man sitting in the chair, in no acute distress HENT: normocephalic atraumatic, mucous membranes moist Eyes: conjunctiva non-erythematous Neck: supple Cardiovascular: regular rate and rhythm, no m/r/g Pulmonary/Chest: normal work of breathing on room air, lungs clear to auscultation bilaterally Abdominal: soft, non-tender, non-distended MSK: normal bulk and tone Neurological: alert & oriented x 3, 5/5 strength in bilateral upper and lower extremities, normal gait Skin: warm and dry Psych: normal mood   Assessment & Plan:   See Encounters Tab for problem based charting.  Patient seen with Dr. Deretha Emory, M.D. [facility], PGY-1 Pager: [phone], Phone: [phone] Date 05/13/2021 Time 2:45 PM

## 2021-05-13 NOTE — Assessment & Plan Note (Signed)
Flu shot received today.

## 2021-05-13 NOTE — Assessment & Plan Note (Signed)
Patient states he will schedule an appointment with the GI for colonoscopy.

## 2021-05-14 NOTE — Progress Notes (Signed)
Internal Medicine Clinic Attending  I saw and evaluated the patient.  I personally confirmed the key portions of the history and exam documented by Dr. Ariwodo and I reviewed pertinent patient test results.  The assessment, diagnosis, and plan were formulated together and I agree with the documentation in the resident's note.   

## 2021-05-15 ENCOUNTER — Other Ambulatory Visit (HOSPITAL_COMMUNITY): Payer: Self-pay | Admitting: Internal Medicine

## 2021-05-15 ENCOUNTER — Other Ambulatory Visit: Payer: Self-pay

## 2021-05-15 MED ORDER — ISOSORBIDE MONONITRATE ER 30 MG PO TB24: ORAL_TABLET | ORAL | 0 refills | Status: DC

## 2021-05-15 NOTE — Telephone Encounter (Signed)
Appears as though heart failure would like patient to be seen prior to refill. Will refill for 2 week supply and recommend he contact [facility] to be seen.

## 2021-06-19 ENCOUNTER — Other Ambulatory Visit: Payer: Self-pay

## 2021-06-19 ENCOUNTER — Other Ambulatory Visit (HOSPITAL_COMMUNITY): Payer: Self-pay | Admitting: Internal Medicine

## 2021-06-19 MED ORDER — ENTRESTO 97-103 MG PO TABS: 1.0000 | ORAL_TABLET | Freq: Two times a day (BID) | ORAL | 5 refills | Status: DC

## 2021-07-03 ENCOUNTER — Other Ambulatory Visit: Payer: Self-pay | Admitting: *Deleted

## 2021-07-03 MED ORDER — ENTRESTO 97-103 MG PO TABS: 1.0000 | ORAL_TABLET | Freq: Two times a day (BID) | ORAL | 5 refills | Status: DC

## 2021-07-03 NOTE — Telephone Encounter (Signed)
Pt stated he called the pharmacy who stated they did not receive Entresto rx which was refilled on the 7th.  I called [facility] who confirmed the above. I will the doctor to re-send rx.  Thanks

## 2021-07-15 ENCOUNTER — Other Ambulatory Visit: Payer: Self-pay | Admitting: Internal Medicine

## 2021-07-15 DIAGNOSIS — I428 Other cardiomyopathies: Secondary | ICD-10-CM

## 2021-07-15 DIAGNOSIS — I10 Essential (primary) hypertension: Secondary | ICD-10-CM

## 2021-07-15 DIAGNOSIS — E7849 Other hyperlipidemia: Secondary | ICD-10-CM

## 2021-07-23 ENCOUNTER — Other Ambulatory Visit (HOSPITAL_COMMUNITY): Payer: Self-pay | Admitting: Internal Medicine

## 2021-07-24 ENCOUNTER — Other Ambulatory Visit (HOSPITAL_COMMUNITY): Payer: Self-pay

## 2021-10-10 ENCOUNTER — Other Ambulatory Visit: Payer: Self-pay | Admitting: Internal Medicine

## 2021-10-10 ENCOUNTER — Other Ambulatory Visit (HOSPITAL_COMMUNITY): Payer: Self-pay | Admitting: Internal Medicine

## 2021-10-10 DIAGNOSIS — I428 Other cardiomyopathies: Secondary | ICD-10-CM

## 2021-10-10 DIAGNOSIS — E7849 Other hyperlipidemia: Secondary | ICD-10-CM

## 2021-10-10 DIAGNOSIS — I10 Essential (primary) hypertension: Secondary | ICD-10-CM

## 2021-10-11 NOTE — Addendum Note (Signed)
Addended by: Judyann Munson on: 10/11/2021 09:02 AM ? ? Modules accepted: Orders ? ?

## 2021-10-18 NOTE — Telephone Encounter (Signed)
Pt has never been seen in our office before. Would Dr. Haroldine Laws like to refill this medication requiring pt to make an appt with [facility]? Please address ?

## 2021-11-09 ENCOUNTER — Other Ambulatory Visit: Payer: Self-pay | Admitting: Internal Medicine

## 2021-11-09 DIAGNOSIS — I10 Essential (primary) hypertension: Secondary | ICD-10-CM

## 2021-11-25 ENCOUNTER — Encounter: Payer: Self-pay | Admitting: Internal Medicine

## 2021-11-25 ENCOUNTER — Ambulatory Visit (INDEPENDENT_AMBULATORY_CARE_PROVIDER_SITE_OTHER): Payer: Medicare Other | Admitting: Internal Medicine

## 2021-11-25 DIAGNOSIS — F17210 Nicotine dependence, cigarettes, uncomplicated: Secondary | ICD-10-CM

## 2021-11-25 DIAGNOSIS — Z1211 Encounter for screening for malignant neoplasm of colon: Secondary | ICD-10-CM

## 2021-11-25 DIAGNOSIS — E7849 Other hyperlipidemia: Secondary | ICD-10-CM

## 2021-11-25 DIAGNOSIS — Z0000 Encounter for general adult medical examination without abnormal findings: Secondary | ICD-10-CM

## 2021-11-25 DIAGNOSIS — I110 Hypertensive heart disease with heart failure: Secondary | ICD-10-CM

## 2021-11-25 DIAGNOSIS — Z23 Encounter for immunization: Secondary | ICD-10-CM

## 2021-11-25 DIAGNOSIS — F17200 Nicotine dependence, unspecified, uncomplicated: Secondary | ICD-10-CM | POA: Diagnosis not present

## 2021-11-25 DIAGNOSIS — I428 Other cardiomyopathies: Secondary | ICD-10-CM

## 2021-11-25 DIAGNOSIS — I10 Essential (primary) hypertension: Secondary | ICD-10-CM | POA: Diagnosis not present

## 2021-11-25 DIAGNOSIS — I5022 Chronic systolic (congestive) heart failure: Secondary | ICD-10-CM | POA: Diagnosis not present

## 2021-11-25 MED ORDER — ROSUVASTATIN CALCIUM 10 MG PO TABS: ORAL_TABLET | ORAL | 3 refills | Status: DC

## 2021-11-25 MED ORDER — AMLODIPINE BESYLATE 10 MG PO TABS: 10.0000 mg | ORAL_TABLET | Freq: Every day | ORAL | 3 refills | Status: DC

## 2021-11-25 MED ORDER — ISOSORBIDE MONONITRATE ER 30 MG PO TB24: 30.0000 mg | ORAL_TABLET | Freq: Every day | ORAL | 0 refills | Status: DC

## 2021-11-25 MED ORDER — SPIRONOLACTONE 25 MG PO TABS: ORAL_TABLET | ORAL | 0 refills | Status: DC

## 2021-11-25 MED ORDER — HYDRALAZINE HCL 100 MG PO TABS: ORAL_TABLET | ORAL | 2 refills | Status: DC

## 2021-11-25 MED ORDER — ASPIRIN EC 81 MG PO TBEC: 81.0000 mg | DELAYED_RELEASE_TABLET | Freq: Every day | ORAL | 0 refills | Status: AC

## 2021-11-25 MED ORDER — ENTRESTO 97-103 MG PO TABS: 1.0000 | ORAL_TABLET | Freq: Two times a day (BID) | ORAL | 3 refills | Status: AC

## 2021-11-25 MED ORDER — CARVEDILOL 25 MG PO TABS: ORAL_TABLET | ORAL | 3 refills | Status: DC

## 2021-11-25 NOTE — Progress Notes (Signed)
? ?CC: BP check, medication refill ? ?HPI: ? ?[name] is a 66 y.o. male with a past medical history stated below and presents today for CC listed above. Please see problem based assessment and plan for additional details. ? ?Past Medical History:  ?Diagnosis Date  ? Alcohol abuse   ? hx of , sober since 1997  ? CHF (congestive heart failure) ([place]) 03/02/2017  ? Chronic cough 03/10/2017  ? COPD (chronic obstructive pulmonary disease) ([place])   ? 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 S/p surgery 02/03/2013 by hand surgery Dr Devon Vega    ? Hyperlipidemia   ? Hypertension   ? Hypertensive urgency 04/15/2017  ? 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 Need for immunization against influenza 05/30/2019  ? Need for Streptococcus pneumoniae vaccination 05/30/2019  ? Patient denied receiving a prior vaccine.  ? Nerve compression 12/09/2018  ? Right shoulder pain 03/25/2018  ? Sinus arrhythmia 05/24/2014  ? Tobacco abuse   ? failed zyban  ? Weight gain   ? ? ?Current Outpatient Medications on File Prior to Visit  ?Medication Sig Dispense Refill  ? celecoxib (CELEBREX) 200 MG capsule Take 1 capsule (200 mg total) by mouth 2 (two) times daily. 20 capsule 0  ? doxazosin (CARDURA) 1 MG tablet Take 2 tablets (2 mg total) by mouth daily. 180 tablet 1  ? fluticasone (FLONASE) 50 MCG/ACT nasal spray Place 1 spray into both nostrils daily as needed for allergies or rhinitis. 256 each 2  ? methocarbamol (ROBAXIN) 500 MG tablet Take 1 tablet (500 mg total) by mouth 2 (two) times daily as needed for muscle spasms. 20 tablet 0  ? ?No current facility-administered medications on file prior to visit.  ? ? ?Family History  ?Problem Relation Age of Onset  ? Cancer Father   ?     Lung  ? Parkinson's disease Father   ? Renal Disease Brother   ?     On Dialysis  ? Heart failure Brother   ? Hypertension Brother   ? Hypertension Mother   ? Stroke Brother   ? ? ?Social History  ? ?Socioeconomic History  ? Marital status:  Divorced  ?  Spouse name: Not on file  ? Number of children: 0  ? Years of education: Not on file  ? Highest education level: Not on file  ?Occupational History  ? Occupation: Retired  ?  Comment: Printer at [employer]  ?Tobacco Use  ? Smoking status: Every Day  ?  Packs/day: 0.50  ?  Types: Cigarettes, Cigars  ?  Last attempt to quit: 03/30/2017  ?  Years since quitting: 4.6  ? Smokeless tobacco: Never  ? Tobacco comments:  ?  0.5 PPD wants to quit  ?Substance and Sexual Activity  ? Alcohol use: Not Currently  ?  Alcohol/week: 0.0 standard drinks  ?  Comment: 2 beers/day  ? Drug use: Not Currently  ?  Types: Marijuana  ? Sexual activity: Not on file  ?Other Topics Concern  ? Not on file  ?Social History Narrative  ? Current smoker-  0.5 ppd x ~ 45 yrs  ? Alcohol use- ~2 beers/day; 6 pk beer over a weekend  ? Divorced, married 17 yrs- no children  ?   ? Current Social History 11/15/2020    ?   ? Patient lives alone in a one level home with 4 outside steps with handrails   ?   ? Patient's  method of transportation is personal vehicle.  ?   ? The highest level of education was 12 th grade  ?   ? 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   ? Identified important Relationships are "a lot of women friends."   ?   ? Pets : Male cat, "Snowball"  ?    ? Interests / Fun: "Work in Investment banker, operational garden, ride bike, walk"   ?   ? Current Stressors: "No stress"   ?   ? Religious / Personal Beliefs: "Baptist"   ?   ? Devon Vega, BSN, RN-BC   ?   ? ?Social Determinants of Health  ? ?Financial Resource Strain: Not on file  ?Food Insecurity: Not on file  ?Transportation Needs: Not on file  ?Physical Activity: Not on file  ?Stress: Not on file  ?Social Connections: Not on file  ?Intimate Partner Violence: Not on file  ? ? ?Review of Systems: ?ROS negative except for what is noted on the assessment and plan. ? ?Vitals:  ? 11/25/21 1310 11/25/21 1343  ?BP: (!) 143/76 (!) 156/77  ?Pulse: 72 81  ?Temp: 98.4 ?F (36.9 ?C)    ?TempSrc: Oral   ?SpO2: 100%   ?Weight: 178 lb 12.8 oz (81.1 kg)   ? ? ? ?Physical Exam: ?Constitutional: well-appearing elderly gentleman sitting in the chair, in no acute distress ?HENT: normocephalic atraumatic, mucous membranes moist ?Eyes: conjunctiva non-erythematous ?Neck: supple ?Cardiovascular: regular rate and rhythm, no m/r/g ?Pulmonary/Chest: normal work of breathing on room air, lungs clear to auscultation bilaterally ?Abdominal: soft, non-tender, non-distended ?MSK: normal bulk and tone ?Neurological: alert & oriented x 3, 5/5 strength in bilateral upper and lower extremities, normal gait ?Skin: warm and dry ?Psych: Normal mood, normal behavior  ? ? ?Assessment & Plan:  ? ?See Encounters Tab for problem based charting. ? ?Patient discussed with Dr. Philipp Ovens ?Timothy Lasso, M.D. ?[facility] Internal Medicine, PGY-1 ?Pager: [phone], Phone: [phone] ?Date 11/25/2021 Time 1:58 PM  ?

## 2021-11-25 NOTE — Assessment & Plan Note (Addendum)
Prior office visit patient received FOBT kit.  He acknowledges that he has it at home and will complete and submit sample.  He was told to complete this before June as the referral will expire in June 2023.  He acknowledges and agrees.  He does have a history of hemorrhoids and diverticulosis. In the event his FOBT comes back positive, he will still need to undergo a colonoscopy. He acknowledges and agrees. ? ? ?P: ?-Complete FOBT kit ?

## 2021-11-25 NOTE — Assessment & Plan Note (Addendum)
Prior office visit there was discussion regarding smoke cessation and at that time patient was in precontemplation stage. Today, He states that he continues smokes half a pack of cigarettes daily and he likes to smoke.  He currently has nicotine patches and nicotine gum, but have not used them yet.  Discussion was had in great lengths regarding risk and consequences of smoking.  He acknowledges and agrees.  Half a pack of cigarettes is about 10 cigarettes a day, we agreed on him smoking 9 cigarettes a day and we slowly titrate down.  He is agreeable to this plan.  He was also encouraged to try either the nicotine patches or nicotine gum. ? ? ?P: ?-Patient encouraged to cut down on smoking to hopefully quit ?-Patient encouraged to try the nicotine gum or nicotine patch ?-Follow-up discussion on next office visit ?

## 2021-11-25 NOTE — Patient Instructions (Signed)
Thank you, [name] for allowing [place] to provide your care today.   ? ?I have ordered the following labs for you: ? ?Lab Orders    ?     BMP8+Anion Gap    ?  ? ? ?Referrals ordered today:  ? ?Referral Orders  ?No referral(s) requested today  ?  ? ?I have ordered the following medication/changed the following medications:  ? ?Continue the following medications: ?Meds ordered this encounter  ?Medications  ? amLODipine (NORVASC) 10 MG tablet  ?  Sig: Take 1 tablet (10 mg total) by mouth daily.  ?  Dispense:  90 tablet  ?  Refill:  3  ? aspirin EC 81 MG tablet  ?  Sig: Take 1 tablet (81 mg total) by mouth daily.  ?  Dispense:  90 tablet  ?  Refill:  0  ? carvedilol (COREG) 25 MG tablet  ?  Sig: TAKE 1 TABLET(25 MG) BY MOUTH TWICE DAILY WITH A MEAL  ?  Dispense:  180 tablet  ?  Refill:  3  ? hydrALAZINE (APRESOLINE) 100 MG tablet  ?  Sig: TAKE 1 TABLET(100 MG) BY MOUTH THREE TIMES DAILY  ?  Dispense:  180 tablet  ?  Refill:  2  ? isosorbide mononitrate (IMDUR) 30 MG 24 hr tablet  ?  Sig: Take 1 tablet (30 mg total) by mouth daily. PLEASE FOLLOW UP WITH [facility] FOR ANYMORE REFILLS  ?  Dispense:  90 tablet  ?  Refill:  0  ? rosuvastatin (CRESTOR) 10 MG tablet  ?  Sig: TAKE 1 TABLET(10 MG) BY MOUTH DAILY  ?  Dispense:  90 tablet  ?  Refill:  3  ? sacubitril-valsartan (ENTRESTO) 97-103 MG  ?  Sig: Take 1 tablet by mouth 2 (two) times daily.  ?  Dispense:  180 tablet  ?  Refill:  3  ? spironolactone (ALDACTONE) 25 MG tablet  ?  Sig: Take once a day by mouth  ?  Dispense:  90 tablet  ?  Refill:  0  ?  ? ?Follow up: 6 months  ? ?Remember: Continue taking all your medications as directed. Please try to cut down on smoking cigarettes as it is better for your health. ? ?Should you have any questions or concerns please call the internal medicine clinic at [phone].   ? ?Timothy Lasso, MD ?[place] ?  ?

## 2021-11-25 NOTE — Assessment & Plan Note (Addendum)
Patient received pneumococcal conjugate vaccine 20 Valent today. ?

## 2021-11-25 NOTE — Assessment & Plan Note (Addendum)
Home medication include: amlodipine '10mg'$  daiyl, Entresto, spironolactone '25mg'$ ; Coreg '25mg'$ .  Blood pressure not at goal during this visit, he states that he has not taken his medication.  He is in need for refill.  He denies chest pain, shortness of breath, dyspnea on exertion, chest pain on exertion, or lower extremity edema.  On physical exam lungs are clear to auscultation, heart sounds were normal, no murmurs rubs or gallops appreciated. ? ? ?P: ?-BMP ordered ?-Continue current medication regimen; on next visit blood pressure is elevated consider medication adjustment ?

## 2021-11-25 NOTE — Assessment & Plan Note (Signed)
Patient was last seen by cardiology September 2020.  Echocardiogram was obtained at that time with an EF of 60 to 65% with mild to moderate aortic regurg.  Patient was previously diagnosed with heart failure which has since resolved.  He is currently on GDMT and tolerating well. ? ?P: ?-Continue GDMT regimen ?

## 2021-11-25 NOTE — Assessment & Plan Note (Signed)
Lipid panel last obtained in September 2022, results at goal.  Patient currently takes rosuvastatin 10 mg daily for primary prevention of CVD.  He denies any complaints at this time.  Patient tolerating medication well. ? ?P: ?- Continue rosuvastatin 10 mg daily ?

## 2021-11-26 LAB — BMP8+ANION GAP
Anion Gap: 14 mmol/L (ref 10.0–18.0)
BUN/Creatinine Ratio: 10 (ref 10–24)
BUN: 10 mg/dL (ref 8–27)
CO2: 21 mmol/L (ref 20–29)
Calcium: 9.3 mg/dL (ref 8.6–10.2)
Chloride: 106 mmol/L (ref 96–106)
Creatinine, Ser: 0.99 mg/dL (ref 0.76–1.27)
Glucose: 123 mg/dL — ABNORMAL HIGH (ref 70–99)
Potassium: 4.5 mmol/L (ref 3.5–5.2)
Sodium: 141 mmol/L (ref 134–144)
eGFR: 83 mL/min/{1.73_m2} (ref >59)

## 2021-12-05 ENCOUNTER — Other Ambulatory Visit: Payer: Self-pay

## 2021-12-05 DIAGNOSIS — I10 Essential (primary) hypertension: Secondary | ICD-10-CM

## 2021-12-06 NOTE — Progress Notes (Signed)
Internal Medicine Clinic Attending ? ?Case discussed with Dr. Ariwodo  At the time of the visit.  We reviewed the resident?s history and exam and pertinent patient test results.  I agree with the assessment, diagnosis, and plan of care documented in the resident?s note.  ?

## 2021-12-06 NOTE — Addendum Note (Signed)
Addended by: Jodean Lima on: 12/06/2021 10:06 AM   Modules accepted: Level of Service

## 2021-12-11 ENCOUNTER — Other Ambulatory Visit: Payer: Self-pay

## 2021-12-11 DIAGNOSIS — I10 Essential (primary) hypertension: Secondary | ICD-10-CM

## 2021-12-13 ENCOUNTER — Ambulatory Visit (INDEPENDENT_AMBULATORY_CARE_PROVIDER_SITE_OTHER): Payer: Medicare HMO | Admitting: Internal Medicine

## 2021-12-13 DIAGNOSIS — Z0000 Encounter for general adult medical examination without abnormal findings: Secondary | ICD-10-CM

## 2021-12-13 MED ORDER — DOXAZOSIN MESYLATE 1 MG PO TABS: 2.0000 mg | ORAL_TABLET | Freq: Every day | ORAL | 0 refills | Status: DC

## 2021-12-13 NOTE — Progress Notes (Signed)
Subjective:   Devon Vega is a 66 y.o. male who presents for Medicare Annual/Subsequent preventive examination. I connected with  Devon Vega on 12/13/21 by a audio enabled telemedicine application and verified that I am speaking with the correct person using two identifiers.  Patient Location: Home  Provider Location: Office/Clinic  I discussed the limitations of evaluation and management by telemedicine. The patient expressed understanding and agreed to proceed.   Review of Systems    DEFERRED TO PCP        Objective:    There were no vitals filed for this visit. There is no height or weight on file to calculate BMI.     11/25/2021    1:11 PM 05/13/2021    1:15 PM 04/11/2021    3:26 PM 12/25/2020    2:18 PM 11/15/2020   11:28 AM 04/12/2020    4:35 PM 05/30/2019    9:38 AM  Advanced Directives  Does Patient Have a Medical Advance Directive? No No No No No No No  Would patient like information on creating a medical advance directive? No - Patient declined No - Patient declined No - Patient declined No - Patient declined Yes (MAU/Ambulatory/Procedural Areas - Information given) No - Patient declined No - Patient declined    Current Medications (verified) Outpatient Encounter Medications as of 12/13/2021  Medication Sig   amLODipine (NORVASC) 10 MG tablet Take 1 tablet (10 mg total) by mouth daily.   aspirin EC 81 MG tablet Take 1 tablet (81 mg total) by mouth daily.   carvedilol (COREG) 25 MG tablet TAKE 1 TABLET(25 MG) BY MOUTH TWICE DAILY WITH A MEAL   celecoxib (CELEBREX) 200 MG capsule Take 1 capsule (200 mg total) by mouth 2 (two) times daily.   doxazosin (CARDURA) 1 MG tablet Take 2 tablets (2 mg total) by mouth daily.   fluticasone (FLONASE) 50 MCG/ACT nasal spray Place 1 spray into both nostrils daily as needed for allergies or rhinitis.   hydrALAZINE (APRESOLINE) 100 MG tablet TAKE 1 TABLET(100 MG) BY MOUTH THREE TIMES DAILY   isosorbide mononitrate  (IMDUR) 30 MG 24 hr tablet Take 1 tablet (30 mg total) by mouth daily. PLEASE FOLLOW UP WITH [facility] FOR ANYMORE REFILLS   methocarbamol (ROBAXIN) 500 MG tablet Take 1 tablet (500 mg total) by mouth 2 (two) times daily as needed for muscle spasms.   rosuvastatin (CRESTOR) 10 MG tablet TAKE 1 TABLET(10 MG) BY MOUTH DAILY   sacubitril-valsartan (ENTRESTO) 97-103 MG Take 1 tablet by mouth 2 (two) times daily.   spironolactone (ALDACTONE) 25 MG tablet Take once a day by mouth   No facility-administered encounter medications on file as of 12/13/2021.    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   Nerve compression 12/09/2018   Right shoulder pain 03/25/2018   Sinus arrhythmia 05/24/2014  Tobacco abuse    failed zyban   Weight gain    Past Surgical History:  Procedure Laterality Date   COLONOSCOPY     EXCISION METACARPAL MASS Right 02/03/2013   Procedure: WIDE RESECTION OF GRANULAR CELL TUMOR;  Surgeon: Cammie Sickle., MD;  Location: [facility];  Service: Orthopedics;  Laterality: Right;   LYMPH NODE BIOPSY  2011   lt axilla-negative   MASS EXCISION Right 01/21/2013   Procedure: BIOPSY OF MASS RIGHT RING FINGER;  Surgeon: Cammie Sickle., MD;  Location: [place];  Service: Orthopedics;  Laterality: Right;   RIGHT/LEFT HEART CATH AND CORONARY  ANGIOGRAPHY N/A 03/02/2017   Procedure: RIGHT/LEFT HEART CATH AND CORONARY ANGIOGRAPHY;  Surgeon: Jolaine Artist, MD;  Location: [facility];  Service: Cardiovascular;  Laterality: N/A;   SKIN FULL THICKNESS GRAFT Right 02/03/2013   Procedure: SKIN GRAFT FULL THICKNESS RIGHT RING FINGER;  Surgeon: Cammie Sickle., MD;  Location: [facility];  Service: Orthopedics;  Laterality: Right;   Family History  Problem Relation Age of Onset   Cancer Father        Lung   Parkinson's disease Father    Renal Disease Brother        On Dialysis   Heart failure Brother    Hypertension Brother    Hypertension Mother    Stroke Brother    Social History   Socioeconomic History   Marital status: Divorced    Spouse name: Not on file   Number of children: 0   Years of education: Not on file   Highest education level: Not on file  Occupational History   Occupation: Retired    Comment: Personal assistant at [employer] Use   Smoking status: Every Day    Packs/day: 0.50    Types: Cigarettes, Cigars    Last attempt to quit: 03/30/2017    Years since quitting: 4.7   Smokeless tobacco: Never   Tobacco comments:    0.5 PPD wants to quit  Substance and Sexual Activity   Alcohol use: Not Currently    Alcohol/week: 0.0 standard drinks    Comment: 2 beers/day   Drug use: Not Currently    Types: Marijuana   Sexual activity: Not on file  Other Topics Concern   Not on file  Social History Narrative   Current smoker-  0.5 ppd x ~ 45 yrs   Alcohol use- ~2 beers/day; 6 pk beer over a weekend   Divorced, married 17 yrs- no children      Current Social History 11/15/2020        Patient lives alone in a one level home with 4 outside steps with handrails       Patient's method of transportation is personal vehicle.      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 [place], BSN, RN-BC       Social Determinants of Health   Financial Resource Strain:  Not on file  Food Insecurity: Not on file  Transportation Needs: Not on file  Physical Activity: Not on file  Stress: Not on file  Social Connections: Not on file    Tobacco Counseling Ready to quit: Not Answered Counseling given: Not Answered Tobacco comments: 0.5 PPD wants to quit   Clinical Intake:                 Diabetic? NO          Activities of Daily Living    11/25/2021    1:08 PM 05/13/2021    1:15 PM  In your present state of health, do you have any difficulty performing the following activities:  Hearing? 0 0  Vision? 0 0  Difficulty concentrating or making decisions? 0 0  Walking or climbing stairs? 0 0  Dressing or bathing? 0 0  Doing errands, shopping? 0 0    Patient Care Team: Timothy Lasso, MD as PCP - General  Indicate any recent Medical Services you may have received from other than [facility] providers in the past year (date may be approximate).     Assessment:   This is a routine wellness examination for Shakir.  Hearing/Vision screen No results found.  Dietary issues and exercise activities discussed:     Goals Addressed   None   Depression Screen    11/25/2021    1:09 PM 05/13/2021    1:15 PM 04/11/2021    3:26 PM 11/15/2020   11:38 AM 10/11/2020    3:28 PM 04/12/2020    4:35 PM 05/30/2019    9:38 AM  PHQ 2/9 Scores  PHQ - 2 Score 0 0 0 0 0 0 1  PHQ- 9 Score  0 0 0 0 0     Fall Risk    11/25/2021    1:08 PM 05/13/2021    1:15 PM 04/11/2021    3:25 PM 11/15/2020   11:27 AM 10/11/2020    3:28 PM  [place] in the past year? 0 0 0 0 0  Number falls in past yr: 0 0 0  0  Injury with Fall? 0 0 0  0   Risk for fall due to : No Fall Risks No Fall Risks No Fall Risks No Fall Risks No Fall Risks  Follow up Falls evaluation completed Falls evaluation completed Falls evaluation completed;Falls prevention discussed Education provided;Falls prevention discussed Falls evaluation completed    FALL RISK PREVENTION PERTAINING TO THE HOME:  Any stairs in or around the home? Yes  If so, are there any without handrails? No  Home free of loose throw rugs in walkways, pet beds, electrical cords, etc? No  Adequate lighting in your home to reduce risk of falls? Yes   ASSISTIVE DEVICES UTILIZED TO PREVENT FALLS:  Life alert? No  Use of a cane, walker or w/c? No  Grab bars in the bathroom? No  Shower chair or bench in shower? No  Elevated toilet seat or a handicapped toilet? No   TIMED UP AND GO:  Was the test performed? No .  Length of time to ambulate 10 feet: N/A  sec.     Cognitive Function:        Immunizations Immunization History  Administered Date(s) Administered   Fluad Quad(high Dose 65+) 05/13/2021   Influenza,inj,Quad PF,6+ Mos 06/18/2016, 04/08/2017, 03/25/2018, 05/30/2019, 04/12/2020   Influenza-Unspecified 04/21/2014   PFIZER(Purple Top)SARS-COV-2 Vaccination 09/05/2019, 09/26/2019, 06/14/2020   PNEUMOCOCCAL CONJUGATE-20 11/25/2021   Pneumococcal Conjugate-13  10/11/2020   Tdap 05/06/2014    TDAP status: Up to date  Flu Vaccine status: Up to date  Pneumococcal vaccine status: Up to date  Covid-19 vaccine status: Completed vaccines  Qualifies for Shingles Vaccine? Yes   Zostavax completed No   Shingrix Completed?: No.    Education has been provided regarding the importance of this vaccine. Patient has been advised to call insurance company to determine out of pocket expense if they have not yet received this vaccine. Advised may also receive vaccine at local pharmacy or Health Dept. Verbalized acceptance and understanding.  Screening Tests Health Maintenance   Topic Date Due   COLON CANCER SCREENING ANNUAL FOBT  Never done   Zoster Vaccines- Shingrix (1 of 2) Never done   COLONOSCOPY (Pts 45-50yr Insurance coverage will need to be confirmed)  03/09/2019   COVID-19 Vaccine (4 - Booster for Pfizer series) 08/09/2020   INFLUENZA VACCINE  02/11/2022   TETANUS/TDAP  05/06/2024   Pneumonia Vaccine 66 Years old  Completed   Hepatitis C Screening  Completed   HPV VACCINES  Aged Out    Health Maintenance  Health Maintenance Due  Topic Date Due   COLON CANCER SCREENING ANNUAL FOBT  Never done   Zoster Vaccines- Shingrix (1 of 2) Never done   COLONOSCOPY (Pts 45-4109yrInsurance coverage will need to be confirmed)  03/09/2019   COVID-19 Vaccine (4 - Booster for PfKenteries) 08/09/2020    Colorectal cancer screening: Type of screening: Colonoscopy. Completed 03/08/2009. Repeat every 10 years  Lung Cancer Screening: (Low Dose CT Chest recommended if Age 68-80ears, 30 pack-year currently smoking OR have quit w/in 15years.) does not qualify.   Lung Cancer Screening Referral: DEFERRED TO PCP   Additional Screening:  Hepatitis C Screening: does qualify; Completed 12/25/2020  Vision Screening: Recommended annual ophthalmology exams for early detection of glaucoma and other disorders of the eye. Is the patient up to date with their annual eye exam?  Yes  Who is the provider or what is the name of the office in which the patient attends annual eye exams?  DR GRKaty FitchIf pt is not established with a provider, would they like to be referred to a provider to establish care? No .   Dental Screening: Recommended annual dental exams for proper oral hygiene  Community Resource Referral / Chronic Care Management: CRR required this visit?  No   CCM required this visit?  No      Plan:     I have personally reviewed and noted the following in the patient's chart:   Medical and social history Use of alcohol, tobacco or illicit drugs  Current  medications and supplements including opioid prescriptions. Patient is not currently taking opioid prescriptions. Functional ability and status Nutritional status Physical activity Advanced directives List of other physicians Hospitalizations, surgeries, and ER visits in previous 12 months Vitals Screenings to include cognitive, depression, and falls Referrals and appointments  In addition, I have reviewed and discussed with patient certain preventive protocols, quality metrics, and best practice recommendations. A written personalized care plan for preventive services as well as general preventive health recommendations were provided to patient.     anJudyann MunsonCMA   12/13/2021   Nurse Notes:  NON FACE TO FACE    Mr. CaFarace Thank you for taking time to come for your Medicare Wellness Visit. I appreciate your ongoing commitment to your health goals. Please review the following plan we discussed and let me  know if I can assist you in the future.   These are the goals we discussed:  Goals       Continue current level of physical activity      Work in vegetable garden twice weekly for 30 minutes Ride bike twice weekly for 30 minutes Walk 3 times per week for 20 minutes      Quit Smoking (pt-stated)        This is a list of the screening recommended for you and due dates:  Health Maintenance  Topic Date Due   Stool Blood Test  Never done   Zoster (Shingles) Vaccine (1 of 2) Never done   Colon Cancer Screening  03/09/2019   COVID-19 Vaccine (4 - Booster for Pfizer series) 08/09/2020   Flu Shot  02/11/2022   Tetanus Vaccine  05/06/2024   Pneumonia Vaccine  Completed   Hepatitis C Screening: USPSTF Recommendation to screen - Ages 18-79 yo.  Completed   HPV Vaccine  Aged Out

## 2021-12-13 NOTE — Telephone Encounter (Signed)
Patient called back very sure that he has been using doxazosin consistently and is worried about discontinuing suddenly. I will refill 3 months supply, and we will ask him to come back for another assessment of hypertension by then. It is correct that going forward doxazosin may not be an ideal choice given his advancing age.

## 2021-12-13 NOTE — Patient Instructions (Signed)
Health Maintenance, Male Adopting a healthy lifestyle and getting preventive care are important in promoting health and wellness. Ask your health care provider about: The right schedule for you to have regular tests and exams. Things you can do on your own to prevent diseases and keep yourself healthy. What should I know about diet, weight, and exercise? Eat a healthy diet  Eat a diet that includes plenty of vegetables, fruits, low-fat dairy products, and lean protein. Do not eat a lot of foods that are high in solid fats, added sugars, or sodium. Maintain a healthy weight Body mass index (BMI) is a measurement that can be used to identify possible weight problems. It estimates body fat based on height and weight. Your health care provider can help determine your BMI and help you achieve or maintain a healthy weight. Get regular exercise Get regular exercise. This is one of the most important things you can do for your health. Most adults should: Exercise for at least 150 minutes each week. The exercise should increase your heart rate and make you sweat (moderate-intensity exercise). Do strengthening exercises at least twice a week. This is in addition to the moderate-intensity exercise. Spend less time sitting. Even light physical activity can be beneficial. Watch cholesterol and blood lipids Have your blood tested for lipids and cholesterol at 68 years of age, then have this test every 5 years. You may need to have your cholesterol levels checked more often if: Your lipid or cholesterol levels are high. You are older than 68 years of age. You are at high risk for heart disease. What should I know about cancer screening? Many types of cancers can be detected early and may often be prevented. Depending on your health history and family history, you may need to have cancer screening at various ages. This may include screening for: Colorectal cancer. Prostate cancer. Skin cancer. Lung  cancer. What should I know about heart disease, diabetes, and high blood pressure? Blood pressure and heart disease High blood pressure causes heart disease and increases the risk of stroke. This is more likely to develop in people who have high blood pressure readings or are overweight. Talk with your health care provider about your target blood pressure readings. Have your blood pressure checked: Every 3-5 years if you are 18-39 years of age. Every year if you are 40 years old or older. If you are between the ages of 65 and 75 and are a current or former smoker, ask your health care provider if you should have a one-time screening for abdominal aortic aneurysm (AAA). Diabetes Have regular diabetes screenings. This checks your fasting blood sugar level. Have the screening done: Once every three years after age 45 if you are at a normal weight and have a low risk for diabetes. More often and at a younger age if you are overweight or have a high risk for diabetes. What should I know about preventing infection? Hepatitis B If you have a higher risk for hepatitis B, you should be screened for this virus. Talk with your health care provider to find out if you are at risk for hepatitis B infection. Hepatitis C Blood testing is recommended for: Everyone born from 1945 through 1965. Anyone with known risk factors for hepatitis C. Sexually transmitted infections (STIs) You should be screened each year for STIs, including gonorrhea and chlamydia, if: You are sexually active and are younger than 68 years of age. You are older than 68 years of age and your   health care provider tells you that you are at risk for this type of infection. Your sexual activity has changed since you were last screened, and you are at increased risk for chlamydia or gonorrhea. Ask your health care provider if you are at risk. Ask your health care provider about whether you are at high risk for HIV. Your health care provider  may recommend a prescription medicine to help prevent HIV infection. If you choose to take medicine to prevent HIV, you should first get tested for HIV. You should then be tested every 3 months for as long as you are taking the medicine. Follow these instructions at home: Alcohol use Do not drink alcohol if your health care provider tells you not to drink. If you drink alcohol: Limit how much you have to 0-2 drinks a day. Know how much alcohol is in your drink. In the [place] one drink equals one 12 oz bottle of beer (355 mL), one 5 oz glass of wine (148 mL), or one 1 oz glass of hard liquor (44 mL). Lifestyle Do not use any products that contain nicotine or tobacco. These products include cigarettes, chewing tobacco, and vaping devices, such as e-cigarettes. If you need help quitting, ask your health care provider. Do not use street drugs. Do not share needles. Ask your health care provider for help if you need support or information about quitting drugs. General instructions Schedule regular health, dental, and eye exams. Stay current with your vaccines. Tell your health care provider if: You often feel depressed. You have ever been abused or do not feel safe at home. Summary Adopting a healthy lifestyle and getting preventive care are important in promoting health and wellness. Follow your health care provider's instructions about healthy diet, exercising, and getting tested or screened for diseases. Follow your health care provider's instructions on monitoring your cholesterol and blood pressure. This information is not intended to replace advice given to you by your health care provider. Make sure you discuss any questions you have with your health care provider. Document Revised: 11/19/2020 Document Reviewed: 11/19/2020 Elsevier Patient Education  2023 Elsevier Inc.  

## 2022-01-05 IMAGING — CR DG LUMBAR SPINE 2-3V
3 series · 3 of 3 positions shown · non-contrast
Comparison: None.

CLINICAL DATA: Back pain with sciatica.

EXAM:
LUMBAR SPINE - 2-3 VIEW

[l-spine ap]
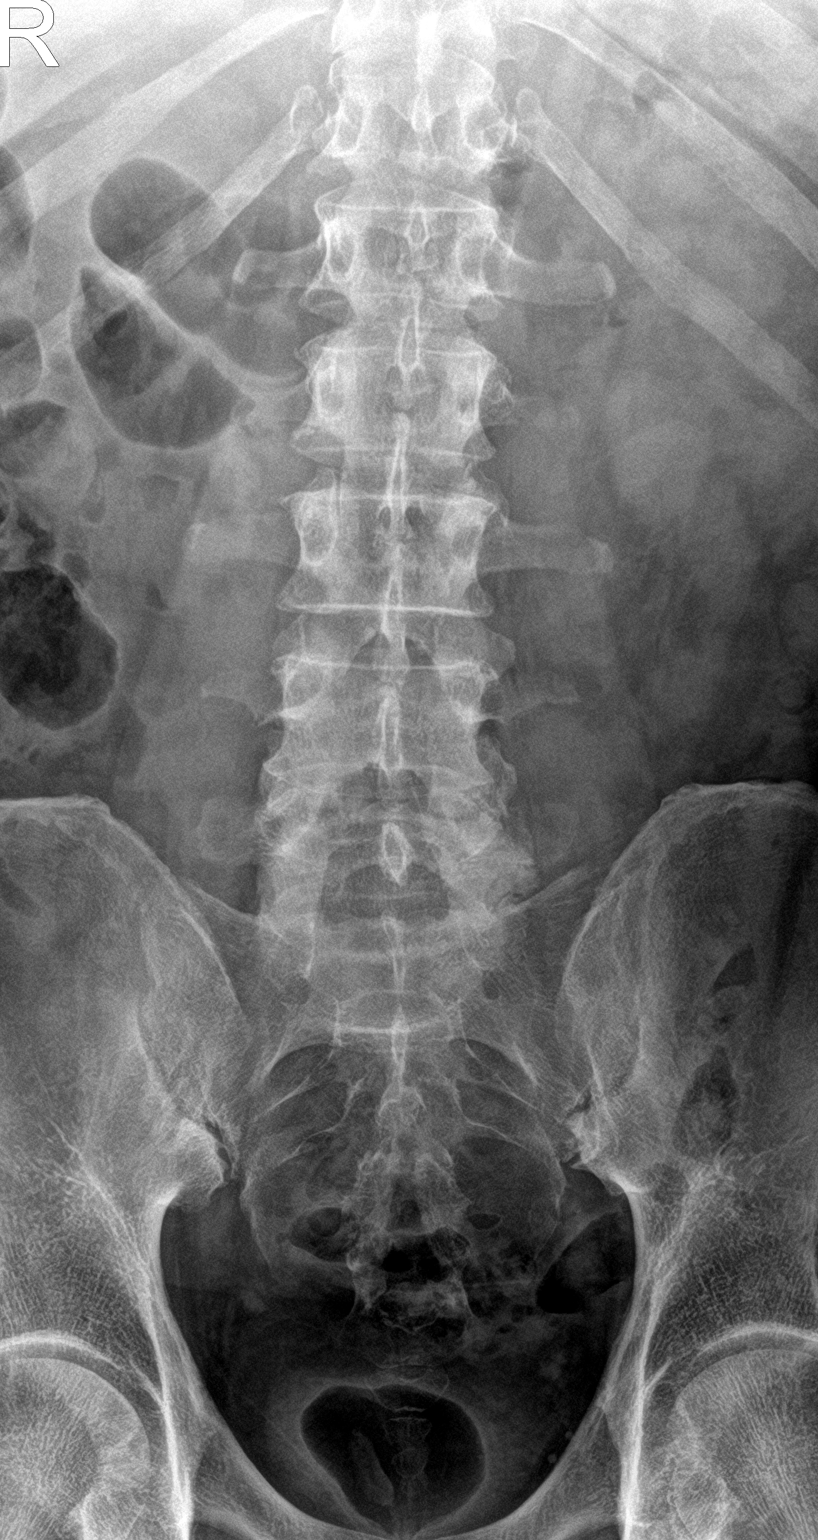

[l-spine lat]
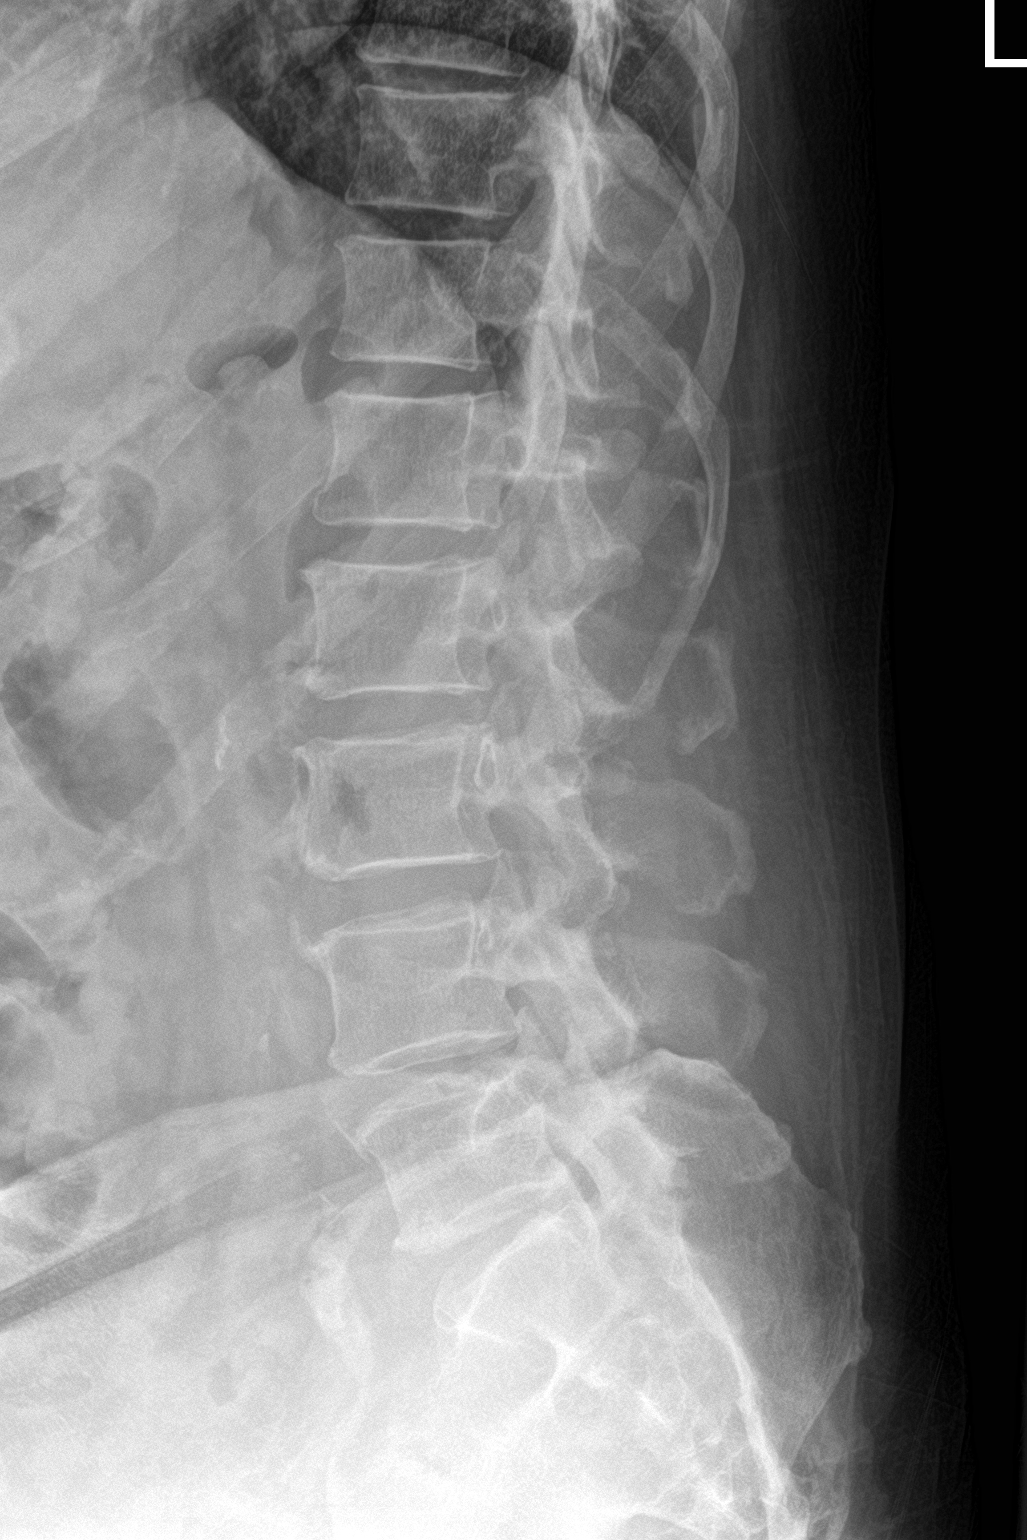

[l-spine spot]
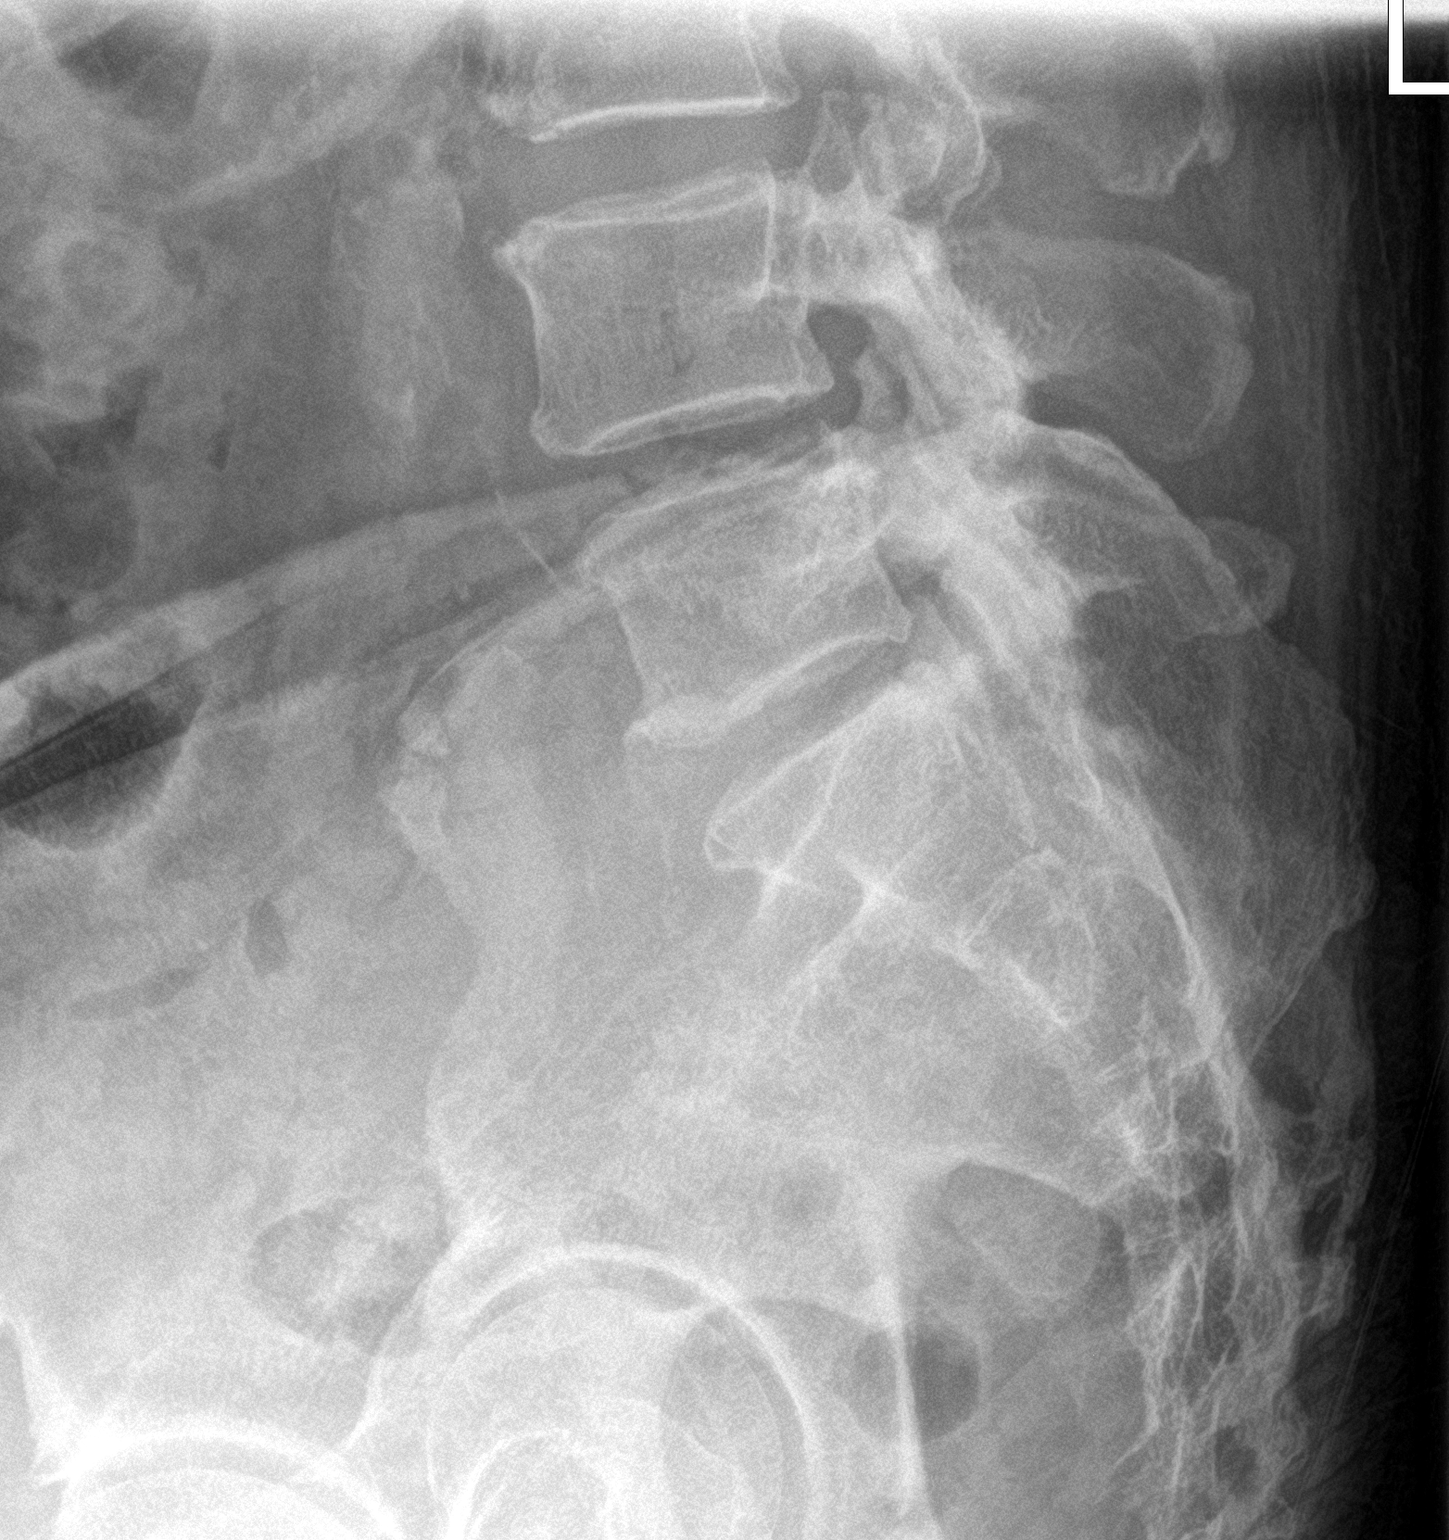

[3 of 3 positions shown; findings below may reference images not displayed]

FINDINGS: No fracture or traumatic malalignment. Mild lower lumbar facet
degenerative changes. Multilevel degenerative disc disease.
Calcified atherosclerosis in the distal abdominal aorta and iliac
vessels.
IMPRESSION: 1. Degenerative changes as above.
2. Calcified atherosclerosis in the distal abdominal aorta and iliac
vessels.

## 2022-01-08 NOTE — Progress Notes (Signed)
Internal Medicine Clinic Attending  I reviewed the AWV findings.  I agree with the assessment, diagnosis, and plan of care documented in the AWV note.     

## 2022-01-13 NOTE — Progress Notes (Signed)
Internal Medicine Clinic Attending  Case discussed with the resident.  I reviewed the AWV findings.  I agree with the assessment, diagnosis, and plan of care documented in the AWV note.     

## 2022-02-03 ENCOUNTER — Other Ambulatory Visit: Payer: Self-pay

## 2022-02-03 NOTE — Telephone Encounter (Signed)
celecoxib (CELEBREX) 200 MG capsule  methocarbamol (ROBAXIN) 500 MG tablet, REFILL REQUEST @ [facility] [phone] - [place], [address] AT [address].

## 2022-02-04 ENCOUNTER — Telehealth: Payer: Self-pay | Admitting: *Deleted

## 2022-02-04 NOTE — Telephone Encounter (Signed)
Call to patient informed of appointment tomorrow 02/05/2022 at 9:15 AM in the Clinics.

## 2022-02-05 ENCOUNTER — Other Ambulatory Visit: Payer: Self-pay

## 2022-02-05 ENCOUNTER — Encounter: Payer: Self-pay | Admitting: Student

## 2022-02-05 ENCOUNTER — Ambulatory Visit (INDEPENDENT_AMBULATORY_CARE_PROVIDER_SITE_OTHER): Payer: Medicare HMO | Admitting: Student

## 2022-02-05 DIAGNOSIS — F17210 Nicotine dependence, cigarettes, uncomplicated: Secondary | ICD-10-CM | POA: Diagnosis not present

## 2022-02-05 DIAGNOSIS — F17200 Nicotine dependence, unspecified, uncomplicated: Secondary | ICD-10-CM

## 2022-02-05 DIAGNOSIS — M5432 Sciatica, left side: Secondary | ICD-10-CM

## 2022-02-05 DIAGNOSIS — I428 Other cardiomyopathies: Secondary | ICD-10-CM | POA: Diagnosis not present

## 2022-02-05 DIAGNOSIS — I10 Essential (primary) hypertension: Secondary | ICD-10-CM

## 2022-02-05 DIAGNOSIS — Z1211 Encounter for screening for malignant neoplasm of colon: Secondary | ICD-10-CM

## 2022-02-05 DIAGNOSIS — M5430 Sciatica, unspecified side: Secondary | ICD-10-CM | POA: Insufficient documentation

## 2022-02-05 MED ORDER — CELECOXIB 200 MG PO CAPS: 200.0000 mg | ORAL_CAPSULE | Freq: Two times a day (BID) | ORAL | 0 refills | Status: DC

## 2022-02-05 MED ORDER — METHOCARBAMOL 500 MG PO TABS: 500.0000 mg | ORAL_TABLET | Freq: Two times a day (BID) | ORAL | 0 refills | Status: DC | PRN

## 2022-02-05 NOTE — Assessment & Plan Note (Signed)
BP today 137/65. He checks his blood pressure at home and notes his BP home readings to be 126/72. Current regimen of amlodipine 10 mg daily, carvedilol 25 mg BID, hydralazine 100 mg TID, entresto 97-103 mg BID, doxasin 2 mg daily, and imdur 30 mg daily. Does well with these medicines.   Continue above medications

## 2022-02-05 NOTE — Assessment & Plan Note (Signed)
Long standing history of left leg pain that starts in his left buttock and down the leg. He describes the pain as a tooth ache. It is exacerbated with ambulation and working in the yard. It improves with NSAIDS and muscle relaxer's, the pain usually resolves after 3 days. He denies any trauma to the area. He denies fever, chills, night sweats, weight loss, loss of bowel or bladder function. Denies localized back pain or  history of malignancy. Physical exam, visual inspection is unremarkable. He has no deformity or step off of the lumbar spine. When supine he has no recurrence of his pain with active/passive range of motion. There is not focal tenderness. When he ambulates he does limp 2/2 to pain and uses a cane. Straight leg test negative.   Suspect he does have some sciatica originating from his piriformis. Discussed continuing to ambulate and not be sedentary. Will refill robaxin and celecoxib, 20 pills. Discussed if pain persists past 3 days, is different, or he has any of the symptoms listed above he needs to be evaluated in our clinic again. Do not believe he needs emergent/urgent imaging. Last Cr normal. No hx of upper GI bleed.

## 2022-02-05 NOTE — Assessment & Plan Note (Signed)
Last visit given FOBT kit, encouraged to bring back at earliest convenience. We also discussed if this is positive he will need a colonoscopy which he agrees to.

## 2022-02-05 NOTE — Assessment & Plan Note (Signed)
Hx of NICM 2/2 uncontrolled HTN. Last echo EF 60-65%. NYHA Class I/II. Stg B/C. His doing well and without shortness of breath with exertion or at rest. Current GDMT of coreg, entresto, aldactone. We discussed addition of SGLT-2 and he will consider adding this medicine. Additional medications of hydralazine and imdur. Does not follow up with cardiology or HF regularly.  Continue GDMT, continue to address addition of SGLT-2

## 2022-02-05 NOTE — Patient Instructions (Signed)
Thank you, Devon Vega for allowing [place] to provide your care today. Today we discussed .  Sciatica Please take the celecoxib and robaxin only as needed and for 3-4 days at a time. If you need more than this, please call our clinic to be seen. If you notice any drastic changes in your pain, such as worsening back pain, persistent pain, or new numbness and tingling, loss of bowel/bladder function, please call us immediately.   Heart Failure Please continue your current medications. Please think about adding a medicine to your regimen called an SGLT-2    Tobacco Please continue to work on cutting back your tobacco use. If you need assistance with this (patches, gum, medicine, etc) please let us know.   Colon cancer screening Please bring the kit back in at your earliest convenience.   I have ordered the following labs for you:  Lab Orders  No laboratory test(s) ordered today     Referrals ordered today:   Referral Orders  No referral(s) requested today     I have ordered the following medication/changed the following medications:   Stop the following medications: Medications Discontinued During This Encounter  Medication Reason   celecoxib (CELEBREX) 200 MG capsule Reorder   methocarbamol (ROBAXIN) 500 MG tablet Reorder     Start the following medications: Meds ordered this encounter  Medications   celecoxib (CELEBREX) 200 MG capsule    Sig: Take 1 capsule (200 mg total) by mouth 2 (two) times daily.    Dispense:  20 capsule    Refill:  0   methocarbamol (ROBAXIN) 500 MG tablet    Sig: Take 1 tablet (500 mg total) by mouth 2 (two) times daily as needed for muscle spasms.    Dispense:  20 tablet    Refill:  0     Follow up:  With your PCP     Should you have any questions or concerns please call the internal medicine clinic at [phone].    Sanjuana Letters, D.O. Agency

## 2022-02-05 NOTE — Progress Notes (Signed)
CC: buttock and left leg pain  HPI:  Devon Vega is a 66 y.o. male living with a history stated below and presents today for follow up of his chronic sciatica. Please see problem based assessment and plan for additional details.  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   Nerve compression 12/09/2018   Right shoulder pain 03/25/2018   Sinus arrhythmia 05/24/2014   Tobacco abuse    failed zyban   Weight gain     Current Outpatient Medications on File Prior to Visit  Medication Sig Dispense Refill   amLODipine (NORVASC) 10 MG tablet Take 1 tablet (10 mg total) by mouth daily. 90 tablet 3   aspirin EC 81 MG tablet Take 1 tablet (81 mg total) by mouth daily. 90 tablet 0   carvedilol (COREG) 25 MG tablet TAKE 1 TABLET(25 MG) BY MOUTH TWICE DAILY WITH A MEAL 180 tablet 3   doxazosin (CARDURA) 1 MG tablet Take 2 tablets (2 mg total) by mouth daily. 180 tablet 0   fluticasone (FLONASE) 50 MCG/ACT nasal spray Place 1 spray into both nostrils daily as needed for allergies or rhinitis. 256 each 2   hydrALAZINE (APRESOLINE) 100 MG tablet TAKE 1 TABLET(100 MG) BY MOUTH THREE TIMES DAILY 180 tablet 2   isosorbide mononitrate (IMDUR) 30 MG 24 hr tablet Take 1 tablet (30 mg total) by mouth daily. PLEASE FOLLOW UP WITH [facility] FOR ANYMORE REFILLS 90 tablet 0   rosuvastatin (CRESTOR) 10 MG tablet TAKE 1 TABLET(10 MG) BY MOUTH DAILY 90 tablet 3    sacubitril-valsartan (ENTRESTO) 97-103 MG Take 1 tablet by mouth 2 (two) times daily. 180 tablet 3   spironolactone (ALDACTONE) 25 MG tablet Take once a day by mouth 90 tablet 0   No current facility-administered medications on file prior to visit.    Family History  Problem Relation Age of Onset   Cancer Father        Lung   Parkinson's disease Father    Renal Disease Brother        On Dialysis   Heart failure Brother    Hypertension Brother    Hypertension Mother    Stroke Brother     Social History   Socioeconomic History   Marital status: Divorced    Spouse name: Not on file   Number of children: 0   Years of education: Not on file   Highest education level: Not on file  Occupational History   Occupation: Retired    Comment: Personal assistant at [employer] Use   Smoking status: Every Day    Packs/day: 0.50    Types: Cigarettes, Cigars    Last attempt to quit: 03/30/2017    Years since quitting: 4.8   Smokeless tobacco: Never   Tobacco comments:    0.5 PPD wants to quit  Substance and Sexual Activity   Alcohol  use: Not Currently    Alcohol/week: 0.0 standard drinks of alcohol    Comment: 2 beers/day   Drug use: Not Currently    Types: Marijuana   Sexual activity: Not on file  Other Topics Concern   Not on file  Social History Narrative   Current smoker-  0.5 ppd x ~ 45 yrs   Alcohol use- ~2 beers/day; 6 pk beer over a weekend   Divorced, married 17 yrs- no children      Current Social History 11/15/2020        Patient lives alone in a one level home with 4 outside steps with handrails       Patient's method of transportation is personal vehicle.      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 [place], BSN, RN-BC       Social Determinants of Health   Financial Resource Strain: Low Risk  (12/13/2021)   Overall Financial Resource Strain (CARDIA)    Difficulty of Paying Living Expenses: Not hard at all  Food Insecurity: No Food Insecurity (12/13/2021)   Hunger Vital Sign    Worried About Running Out of Food in the Last Year: Never true    Ran Out of Food in the Last Year: Never true  Transportation Needs: No Transportation Needs (12/13/2021)   PRAPARE - Hydrologist (Medical): No    Lack of Transportation (Non-Medical): No  Physical Activity: Insufficiently Active (12/13/2021)   Exercise Vital Sign    Days of Exercise per Week: 1 day    Minutes of Exercise per Session: 20 min  Stress: No Stress Concern Present (12/13/2021)   [place]    Feeling of Stress : Not at all  Social Connections: Moderately Integrated (12/13/2021)   Social Connection and Isolation Panel [NHANES]    Frequency of Communication with Friends and Family: More than three times a week    Frequency of Social Gatherings with Friends and Family: More than three times a week    Attends Religious Services: More than 4 times per year    Active Member of [employer] or Organizations: Yes    Attends Archivist Meetings: More than 4 times per year    Marital Status: Widowed  Intimate Partner Violence: Not At Risk (12/13/2021)   Humiliation, Afraid, Rape, and Kick questionnaire    Fear of Current or Ex-Partner: No    Emotionally Abused: No    Physically Abused: No    Sexually Abused: No    Review of Systems: ROS negative except for what is noted on the assessment and plan.  Vitals:   02/05/22 0847  BP: 137/65  Pulse: 82  Temp: 98.5 F (36.9 C)  TempSrc: Oral  SpO2: 100%  Weight: 173 lb 11.2 oz (78.8 kg)  Height: _0  (1.651 m)     Physical Exam: Constitutional:  in no acute distress HENT: normocephalic atraumatic Neck: supple Pulmonary/Chest: normal work of breathing  on room air MSK: normal bulk and tone. Active/passive ROM of LLE without tenderness. No lumbar deformity. No focal spinal tenderness.  Neurological: alert & oriented x 3. Limps with ambulation, using cane Skin: warm and dry Psych: normal mood  Assessment & Plan:   Tobacco use disorder Continues to smoke 1/2 PPD. We discussed cutting down daily, he is not interested in stopping today. He is not interested in supplemental therapy of patches, gum, or additional medications. Will continue to address at follow up visits. Encouraged him to reach out if he changes his mind about assistance  Colon cancer screening Last visit given FOBT kit, encouraged to bring back at earliest convenience. We also discussed if this is positive he will need a colonoscopy which he agrees to.   Essential hypertension BP today 137/65. He checks his blood pressure at home and notes his BP home readings to be 126/72. Current regimen of amlodipine 10 mg daily, carvedilol 25 mg BID, hydralazine 100 mg TID, entresto 97-103 mg BID, doxasin 2 mg daily, and imdur 30 mg daily. Does well with these medicines.   Continue above medications  NICM (nonischemic cardiomyopathy) ([place]) Hx of NICM 2/2 uncontrolled HTN. Last echo EF 60-65%. NYHA Class I/II. Stg B/C. His doing well and without shortness of breath with exertion or at rest. Current GDMT of coreg, entresto, aldactone. We discussed addition of SGLT-2 and he will consider adding this medicine. Additional medications of hydralazine and imdur. Does not follow up with cardiology or HF regularly.  Continue GDMT, continue to address addition of SGLT-2  Sciatica Long standing history of left leg pain that starts in his left buttock and down the leg. He describes the pain as a tooth ache. It is exacerbated with ambulation and working in  the yard. It improves with NSAIDS and muscle relaxer's, the pain usually resolves after 3 days. He denies any trauma to the area. He denies fever, chills, night sweats, weight loss, loss of bowel or bladder function. Denies localized back pain or  history of malignancy. Physical exam, visual inspection is unremarkable. He has no deformity or step off of the lumbar spine. When supine he has no recurrence of his pain with active/passive range of motion. There is not focal tenderness. When he ambulates he does limp 2/2 to pain and uses a cane. Straight leg test negative.   Suspect he does have some sciatica originating from his piriformis. Discussed continuing to ambulate and not be sedentary. Will refill robaxin and celecoxib, 20 pills. Discussed if pain persists past 3 days, is different, or he has any of the symptoms listed above he needs to be evaluated in our clinic again. Do not believe he needs emergent/urgent imaging. Last Cr normal. No hx of upper GI bleed.  Patient discussed with Dr. Butch Penny, D.O. [facility], PGY-3 Phone: [phone] Date 02/05/2022 Time 9:53 AM

## 2022-02-05 NOTE — Assessment & Plan Note (Signed)
Continues to smoke 1/2 PPD. We discussed cutting down daily, he is not interested in stopping today. He is not interested in supplemental therapy of patches, gum, or additional medications. Will continue to address at follow up visits. Encouraged him to reach out if he changes his mind about assistance

## 2022-02-06 ENCOUNTER — Encounter: Payer: Self-pay | Admitting: Student

## 2022-02-06 NOTE — Addendum Note (Signed)
Addended by: Charise Killian on: 02/06/2022 01:53 PM   Modules accepted: Level of Service

## 2022-02-06 NOTE — Progress Notes (Signed)
Internal Medicine Clinic Attending  Case discussed with Dr. Katsadouros  At the time of the visit.  We reviewed the resident's history and exam and pertinent patient test results.  I agree with the assessment, diagnosis, and plan of care documented in the resident's note.  

## 2022-03-10 ENCOUNTER — Encounter: Payer: Medicare HMO | Admitting: Internal Medicine

## 2022-03-10 DIAGNOSIS — R7303 Prediabetes: Secondary | ICD-10-CM | POA: Insufficient documentation

## 2022-03-10 HISTORY — DX: Prediabetes: R73.03

## 2022-03-11 ENCOUNTER — Other Ambulatory Visit: Payer: Self-pay | Admitting: *Deleted

## 2022-03-11 DIAGNOSIS — I10 Essential (primary) hypertension: Secondary | ICD-10-CM

## 2022-03-12 MED ORDER — DOXAZOSIN MESYLATE 1 MG PO TABS: 2.0000 mg | ORAL_TABLET | Freq: Every day | ORAL | 3 refills | Status: DC

## 2022-05-14 ENCOUNTER — Other Ambulatory Visit: Payer: Self-pay | Admitting: Internal Medicine

## 2022-05-19 ENCOUNTER — Other Ambulatory Visit: Payer: Self-pay

## 2022-05-19 MED ORDER — ISOSORBIDE MONONITRATE ER 30 MG PO TB24: 30.0000 mg | ORAL_TABLET | Freq: Every day | ORAL | 0 refills | Status: DC

## 2022-06-09 ENCOUNTER — Ambulatory Visit (INDEPENDENT_AMBULATORY_CARE_PROVIDER_SITE_OTHER): Payer: Medicare HMO | Admitting: Internal Medicine

## 2022-06-09 ENCOUNTER — Other Ambulatory Visit: Payer: Self-pay

## 2022-06-09 ENCOUNTER — Encounter: Payer: Self-pay | Admitting: Internal Medicine

## 2022-06-09 VITALS — BP 144/75 | HR 67 | Temp 97.9°F | Ht 65.0 in | Wt 175.9 lb

## 2022-06-09 DIAGNOSIS — I509 Heart failure, unspecified: Secondary | ICD-10-CM | POA: Diagnosis not present

## 2022-06-09 DIAGNOSIS — F17210 Nicotine dependence, cigarettes, uncomplicated: Secondary | ICD-10-CM

## 2022-06-09 DIAGNOSIS — Z6825 Body mass index (BMI) 25.0-25.9, adult: Secondary | ICD-10-CM | POA: Diagnosis not present

## 2022-06-09 DIAGNOSIS — I428 Other cardiomyopathies: Secondary | ICD-10-CM | POA: Diagnosis not present

## 2022-06-09 DIAGNOSIS — I10 Essential (primary) hypertension: Secondary | ICD-10-CM

## 2022-06-09 DIAGNOSIS — F17200 Nicotine dependence, unspecified, uncomplicated: Secondary | ICD-10-CM

## 2022-06-09 DIAGNOSIS — J449 Chronic obstructive pulmonary disease, unspecified: Secondary | ICD-10-CM | POA: Diagnosis not present

## 2022-06-09 DIAGNOSIS — R7303 Prediabetes: Secondary | ICD-10-CM | POA: Diagnosis not present

## 2022-06-09 DIAGNOSIS — E785 Hyperlipidemia, unspecified: Secondary | ICD-10-CM | POA: Diagnosis not present

## 2022-06-09 DIAGNOSIS — I110 Hypertensive heart disease with heart failure: Secondary | ICD-10-CM | POA: Diagnosis not present

## 2022-06-09 DIAGNOSIS — E663 Overweight: Secondary | ICD-10-CM | POA: Diagnosis not present

## 2022-06-09 DIAGNOSIS — Z1211 Encounter for screening for malignant neoplasm of colon: Secondary | ICD-10-CM

## 2022-06-09 MED ORDER — SPIRONOLACTONE 25 MG PO TABS: ORAL_TABLET | ORAL | 3 refills | Status: DC

## 2022-06-09 NOTE — Assessment & Plan Note (Signed)
No symptoms today. Remains on GDMT, minus SGLT2i. It appears this was discussed at a previous visit, we did not address today. We will continue this conversation.  Plan -Continue GDMT, discuss addition of SGLT2i

## 2022-06-09 NOTE — Assessment & Plan Note (Signed)
BMI 29.27, stable over the last year. We discussed nutrition today, as well as increasing physical activity. Currently walking 1-2 miles 1-2x/week.

## 2022-06-09 NOTE — Progress Notes (Signed)
Established Patient Office Visit  Subjective   Patient ID: Devon Vega, male    DOB: April 01, 1956  Age: 66 y.o. MRN: [id number]  Chief Complaint  Patient presents with   Follow-up   [name] is a 66 yo person in clinic today for follow-up of chronic conditions including HF w/ recovered EF, hypertension, hyperlipidemia, prediabetes, and tobacco use. This is his first visit with me. Please see assessment/plan in problem-based charting for further details of today's visit.    Patient Active Problem List   Diagnosis Date Noted   Overweight (BMI 25.0-29.9) 06/09/2022   Prediabetes 03/10/2022   Sciatica 02/05/2022   Colon cancer screening 05/30/2019   NICM (nonischemic cardiomyopathy) ([place]) 03/01/2017   LVH (left ventricular hypertrophy) 09/05/2015   Sinus arrhythmia 05/24/2014   Healthcare maintenance 07/30/2013   Degenerative disc disease, cervical 10/02/2010   Hyperlipidemia 07/27/2006   Tobacco use disorder 07/27/2006   Essential hypertension 07/27/2006   Review of Systems  Respiratory:  Negative for shortness of breath.   Cardiovascular:  Negative for chest pain, palpitations, orthopnea, leg swelling and PND.  Gastrointestinal:  Negative for blood in stool, constipation and melena.  Neurological:  Negative for dizziness and loss of consciousness.      Objective:     BP (!) 144/75 (BP Location: Left Arm, Patient Position: Sitting, Cuff Size: Normal)   Pulse 67   Temp 97.9 F (36.6 C) (Oral)   Ht '5\' 5"'$  (1.651 m)   Wt 175 lb 14.4 oz (79.8 kg)   SpO2 98% Comment: RA  BMI 29.27 kg/m  BP Readings from Last 3 Encounters:  06/09/22 (!) 144/75  02/05/22 137/65  11/25/21 (!) 156/77   Wt Readings from Last 3 Encounters:  06/09/22 175 lb 14.4 oz (79.8 kg)  02/05/22 173 lb 11.2 oz (78.8 kg)  11/25/21 178 lb 12.8 oz (81.1 kg)    Physical Exam Constitutional:      General: He is not in acute distress.    Appearance: Normal appearance. He is not ill-appearing.   Cardiovascular:     Rate and Rhythm: Normal rate.     Heart sounds: Normal heart sounds.  Pulmonary:     Effort: Pulmonary effort is normal. No respiratory distress.     Breath sounds: Normal breath sounds.  Musculoskeletal:        General: No swelling.  Neurological:     General: No focal deficit present.     Mental Status: He is alert.       Assessment & Plan:   Problem List Items Addressed This Visit       Cardiovascular and Mediastinum   Essential hypertension (Chronic)    Blood pressure above goal today at 151/72 > 144/75. [name] does check his home pressures and notes they are typically 120s/70s. He does occasionally notice higher readings before taking his medication. No symptoms today. He is on an extensive medication regimen for his heart failure and given appropriate home readings, we will not make changes today. I will encourage him to bring his cuff to the next appointment for comparison.  Plan -Continue amlodipine 10 mg, carvedilol 25 mg bid, hydralazine 100 mg tid, Entresto 97-103 mg bid, spironolactone 25 mg daily, doxasozin 2 mg daily, and Imdur 30 mg daily      Relevant Medications   aspirin EC 81 MG tablet   spironolactone (ALDACTONE) 25 MG tablet   NICM (nonischemic cardiomyopathy) ([facility]) (Chronic)    No symptoms today. Remains on GDMT, minus SGLT2i. It  appears this was discussed at a previous visit, we did not address today. We will continue this conversation.  Plan -Continue GDMT, discuss addition of SGLT2i      Relevant Medications   aspirin EC 81 MG tablet   spironolactone (ALDACTONE) 25 MG tablet   Other Relevant Orders   BMP8+Anion Gap     Other   Hyperlipidemia (Chronic)    Remains on rosuvastatin 10 mg daily for primary prevention. Recheck annual lipid panel today.  Plan -Lipid profile -Continue rosuvastatin 10 mg      Relevant Medications   aspirin EC 81 MG tablet   spironolactone (ALDACTONE) 25 MG tablet   Other Relevant  Orders   Lipid Profile   Tobacco use disorder (Chronic)    [name] continues to smoke 1/2 ppd. He has previous success with quitting without assistance for about 6 months although resumed due to the temptation of his coworkers smoking around him. He would like to quit for financial reasons as cigarettes have continued to increase in price, however he is not interested in assistance in the form of medication today. He had paranoid thoughts with the use of Chantix in the past, we discussed alternatives include bupropion or NRT. He will reach out if interested in help.  Plan -Continue to discuss cessation -Consider LC screening at f/u       Colon cancer screening    [name] has not yet completed 10-year recall CSY. Today, he notes this is because he is scared of going through the prep again. We discussed the difficulty of this but the importance of the screening. He was provided with the phone number for scheduling when he is ready.      Prediabetes - Primary    History of preDM, recheck annual A1c.      Relevant Orders   Hemoglobin A1c   Overweight (BMI 25.0-29.9)    BMI 29.27, stable over the last year. We discussed nutrition today, as well as increasing physical activity. Currently walking 1-2 miles 1-2x/week.        Return in about 6 months (around 12/08/2022) for f/u.    Charise Killian, MD

## 2022-06-09 NOTE — Assessment & Plan Note (Addendum)
Mr. Paige continues to smoke 1/2 ppd. He has previous success with quitting without assistance for about 6 months although resumed due to the temptation of his coworkers smoking around him. He would like to quit for financial reasons as cigarettes have continued to increase in price, however he is not interested in assistance in the form of medication today. He had paranoid thoughts with the use of Chantix in the past, we discussed alternatives include bupropion or NRT. He will reach out if interested in help.  Plan -Continue to discuss cessation -Consider LC screening at f/u

## 2022-06-09 NOTE — Assessment & Plan Note (Signed)
[name] has not yet completed 10-year recall CSY. Today, he notes this is because he is scared of going through the prep again. We discussed the difficulty of this but the importance of the screening. He was provided with the phone number for scheduling when he is ready.

## 2022-06-09 NOTE — Assessment & Plan Note (Signed)
Remains on rosuvastatin 10 mg daily for primary prevention. Recheck annual lipid panel today.  Plan -Lipid profile -Continue rosuvastatin 10 mg

## 2022-06-09 NOTE — Assessment & Plan Note (Signed)
History of preDM, recheck annual A1c.

## 2022-06-09 NOTE — Patient Instructions (Addendum)
It was wonderful to see you today Mr. Hence!  Try to increase your walking to 3-4 times each week. Keep up the healthy food choices!  If you decide you need help with quitting smoking, please let me know and we can talk about options.   For the colonoscopy--when you're ready, please call [phone] to schedule.   Please contact your pharmacy about scheduling the shingles (Shingrix) and updated COVID-19 vaccines.

## 2022-06-09 NOTE — Assessment & Plan Note (Addendum)
Blood pressure above goal today at 151/72 > 144/75. Devon Vega does check his home pressures and notes they are typically 120s/70s. He does occasionally notice higher readings before taking his medication. No symptoms today. He is on an extensive medication regimen for his heart failure and given appropriate home readings, we will not make changes today. I will encourage him to bring his cuff to the next appointment for comparison.  Plan -Continue amlodipine 10 mg, carvedilol 25 mg bid, hydralazine 100 mg tid, Entresto 97-103 mg bid, spironolactone 25 mg daily, doxasozin 2 mg daily, and Imdur 30 mg daily

## 2022-06-10 LAB — BMP8+ANION GAP
Anion Gap: 13 mmol/L (ref 10.0–18.0)
BUN/Creatinine Ratio: 9 — ABNORMAL LOW (ref 10–24)
BUN: 9 mg/dL (ref 8–27)
CO2: 22 mmol/L (ref 20–29)
Calcium: 9.7 mg/dL (ref 8.6–10.2)
Chloride: 102 mmol/L (ref 96–106)
Creatinine, Ser: 1.01 mg/dL (ref 0.76–1.27)
Glucose: 163 mg/dL — ABNORMAL HIGH (ref 70–99)
Potassium: 4.3 mmol/L (ref 3.5–5.2)
Sodium: 137 mmol/L (ref 134–144)
eGFR: 81 mL/min/{1.73_m2} (ref >59)

## 2022-06-10 LAB — LIPID PANEL
Chol/HDL Ratio: 2.9 ratio (ref 0.0–5.0)
Cholesterol, Total: 128 mg/dL (ref 100–199)
HDL: 44 mg/dL (ref >39)
LDL Chol Calc (NIH): 56 mg/dL (ref 0–99)
Triglycerides: 164 mg/dL — ABNORMAL HIGH (ref 0–149)
VLDL Cholesterol Cal: 28 mg/dL (ref 5–40)

## 2022-06-10 LAB — HEMOGLOBIN A1C
Est. average glucose Bld gHb Est-mCnc: 126 mg/dL
Hgb A1c MFr Bld: 6 % — ABNORMAL HIGH (ref 4.8–5.6)

## 2022-06-10 NOTE — Progress Notes (Signed)
Kidney function and electrolytes wnl. Unable to reach Devon Vega by telephone 11/28 for review, message left to call the clinic for results.

## 2022-06-10 NOTE — Progress Notes (Signed)
Remains in prediabetes range. Repeat annually.

## 2022-06-10 NOTE — Progress Notes (Signed)
Continue rosuvastatin.  

## 2022-07-15 ENCOUNTER — Other Ambulatory Visit: Payer: Self-pay | Admitting: Internal Medicine

## 2022-07-15 DIAGNOSIS — J302 Other seasonal allergic rhinitis: Secondary | ICD-10-CM

## 2022-07-15 MED ORDER — FLUTICASONE PROPIONATE 50 MCG/ACT NA SUSP: 1.0000 | Freq: Every day | NASAL | 0 refills | Status: DC | PRN

## 2022-07-15 NOTE — Telephone Encounter (Signed)
fluticasone (FLONASE) 50 MCG/ACT nasal spray    [facility] [phone] - [place], [address] AT [address] (Ph: [phone])

## 2022-11-17 ENCOUNTER — Encounter: Payer: Medicare HMO | Admitting: Internal Medicine

## 2022-12-22 ENCOUNTER — Encounter: Payer: Medicare HMO | Admitting: Internal Medicine

## 2022-12-22 ENCOUNTER — Ambulatory Visit: Payer: Medicare HMO

## 2023-03-04 ENCOUNTER — Telehealth: Payer: Self-pay

## 2023-03-04 ENCOUNTER — Ambulatory Visit: Payer: Medicare Other

## 2023-03-04 VITALS — Ht 65.0 in | Wt 175.0 lb

## 2023-03-04 DIAGNOSIS — Z0000 Encounter for general adult medical examination without abnormal findings: Secondary | ICD-10-CM

## 2023-03-04 DIAGNOSIS — Z1211 Encounter for screening for malignant neoplasm of colon: Secondary | ICD-10-CM

## 2023-03-04 DIAGNOSIS — F17210 Nicotine dependence, cigarettes, uncomplicated: Secondary | ICD-10-CM

## 2023-03-04 NOTE — Patient Instructions (Signed)
Devon Vega , Thank you for taking time to come for your Medicare Wellness Visit. I appreciate your ongoing commitment to your health goals. Please review the following plan we discussed and let me know if I can assist you in the future.   Referrals/Orders/Follow-Ups/Clinician Recommendations: Remember you are due for Flu, Covid and Shingles vaccines.  I placed a request for you to get the Cologuard kit for colon screening and also a request for a lung screening referral.  It was nice speaking with you today.  Each day, aim for 6 glasses of water, plenty of protein in your diet and try to get up and walk/ stretch every hour for 5-10 minutes at a time.    This is a list of the screening recommended for you and due dates:  Health Maintenance  Topic Date Due   Screening for Lung Cancer  Never done   Zoster (Shingles) Vaccine (1 of 2) Never done   Colon Cancer Screening  03/09/2019   COVID-19 Vaccine (4 - 2023-24 season) 03/14/2022   Flu Shot  02/12/2023   Medicare Annual Wellness Visit  03/03/2024   DTaP/Tdap/Td vaccine (2 - Td or Tdap) 05/06/2024   Pneumonia Vaccine  Completed   Hepatitis C Screening  Completed   HPV Vaccine  Aged Out    Advanced directives: (Copy Requested) Please bring a copy of your health care power of attorney and living will to the office to be added to your chart at your convenience.  Next Medicare Annual Wellness Visit scheduled for next year: Yes

## 2023-03-04 NOTE — Progress Notes (Signed)
Subjective:   Devon Vega is a 67 y.o. male who presents for Medicare Annual/Subsequent preventive examination.  Visit Complete: Virtual  I connected with  Devon Vega on 03/04/23 by a audio enabled telemedicine application and verified that I am speaking with the correct person using two identifiers.  Patient Location: Home  Provider Location: Home Office  I discussed the limitations of evaluation and management by telemedicine. The patient expressed understanding and agreed to proceed.  Vital Signs: Unable to obtain new vitals due to this being a telehealth visit.  Review of Systems    Cardiac Risk Factors include: advanced age (>41men, >29 women);male gender;hypertension;dyslipidemia;Other (see comment), Risk factor comments: LVH, NICM     Objective:    Today's Vitals   03/04/23 1420  Weight: 175 lb (79.4 kg)  Height: 5\' 5"  (1.651 m)   Body mass index is 29.12 kg/m.     03/04/2023    2:35 PM 06/09/2022    9:08 AM 02/05/2022    8:50 AM 12/13/2021   12:15 PM 11/25/2021    1:11 PM 05/13/2021    1:15 PM 04/11/2021    3:26 PM  Advanced Directives  Does Patient Have a Medical Advance Directive? Yes No No Yes No No No  Type of Estate agent of [place];Living will   Living will     Copy of Healthcare Power of Attorney in Chart? No - copy requested        Would patient like information on creating a medical advance directive?  No - Patient declined   No - Patient declined No - Patient declined No - Patient declined    Current Medications (verified) Outpatient Encounter Medications as of 03/04/2023  Medication Sig   amLODipine (NORVASC) 10 MG tablet Take 1 tablet (10 mg total) by mouth daily.   aspirin EC 81 MG tablet Take 81 mg by mouth daily. Swallow whole.   carvedilol (COREG) 25 MG tablet TAKE 1 TABLET(25 MG) BY MOUTH TWICE DAILY WITH A MEAL   celecoxib (CELEBREX) 200 MG capsule Take 1 capsule (200 mg total) by mouth 2 (two) times daily.    doxazosin (CARDURA) 1 MG tablet Take 2 tablets (2 mg total) by mouth daily.   fluticasone (FLONASE) 50 MCG/ACT nasal spray Place 1 spray into both nostrils daily as needed for allergies or rhinitis.   hydrALAZINE (APRESOLINE) 100 MG tablet TAKE 1 TABLET(100 MG) BY MOUTH THREE TIMES DAILY   methocarbamol (ROBAXIN) 500 MG tablet Take 1 tablet (500 mg total) by mouth 2 (two) times daily as needed for muscle spasms.   rosuvastatin (CRESTOR) 10 MG tablet TAKE 1 TABLET(10 MG) BY MOUTH DAILY   spironolactone (ALDACTONE) 25 MG tablet Take once a day by mouth   isosorbide mononitrate (IMDUR) 30 MG 24 hr tablet Take 1 tablet (30 mg total) by mouth daily.   No facility-administered encounter medications on file as of 03/04/2023.    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   Nerve compression 12/09/2018   Right shoulder pain 03/25/2018   Sinus arrhythmia 05/24/2014   Tobacco abuse    failed zyban   Weight gain    Past Surgical History:  Procedure Laterality Date   COLONOSCOPY     EXCISION METACARPAL MASS Right 02/03/2013   Procedure: WIDE RESECTION OF GRANULAR CELL TUMOR;  Surgeon: Wyn Forster., MD;  Location: [facility];  Service: Orthopedics;  Laterality: Right;   LYMPH NODE BIOPSY  2011   lt axilla-negative   MASS EXCISION Right 01/21/2013   Procedure: BIOPSY OF MASS RIGHT  RING FINGER;  Surgeon: Wyn Forster., MD;  Location: [facility];  Service: Orthopedics;  Laterality: Right;   RIGHT/LEFT HEART CATH AND CORONARY ANGIOGRAPHY N/A 03/02/2017   Procedure: RIGHT/LEFT HEART CATH AND CORONARY ANGIOGRAPHY;  Surgeon: Dolores Patty, MD;  Location: [facility];  Service: Cardiovascular;  Laterality: N/A;   SKIN FULL THICKNESS GRAFT Right 02/03/2013   Procedure: SKIN GRAFT FULL THICKNESS RIGHT RING FINGER;  Surgeon: Wyn Forster., MD;  Location: [facility];  Service: Orthopedics;  Laterality: Right;   Family History  Problem Relation Age of Onset   Cancer Father        Lung   Parkinson's disease Father    Renal Disease Brother        On Dialysis   Heart failure Brother    Hypertension Brother    Hypertension Mother    Stroke Brother    Social History   Socioeconomic History   Marital status: Divorced    Spouse name: Not on file   Number of children: 0   Years of education: Not on file   Highest education level: Not on file  Occupational History   Occupation: Retired    Comment: Counsellor at [employer]  Tobacco Use   Smoking status: Every Day    Current packs/day: 0.50    Average packs/day: 0.5 packs/day for 52.0 years (26.0 ttl pk-yrs)    Types: Cigarettes   Smokeless tobacco: Never   Tobacco comments:    0.5 PPD  Vaping Use   Vaping status: Never Used  Substance and Sexual Activity   Alcohol use: Yes    Comment: 2 beers/week   Drug use: Not Currently    Types: Marijuana   Sexual activity: Not on file  Other Topics Concern   Not on file  Social History Narrative   Current smoker-  0.5 ppd x ~ 45 yrs   Alcohol use- ~2 beers/day; 6 pk beer over a weekend   Divorced, married 17 yrs- no children      Current Social History 11/15/2020        Patient lives alone in a one level home with 4 outside steps with handrails       Patient's method of transportation is personal vehicle.      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 Ducatte, BSN, RN-BC       Social Determinants of Health   Financial Resource Strain: Low Risk  (03/04/2023)   Overall Financial Resource Strain (CARDIA)    Difficulty of Paying Living Expenses: Not very hard  Food Insecurity: No Food Insecurity (03/04/2023)   Hunger Vital Sign    Worried About Running Out of Food in the Last Year: Never true    Ran Out of Food in the Last Year: Never true  Transportation Needs: No Transportation Needs (03/04/2023)   PRAPARE - Administrator, Civil Service (Medical): No    Lack of Transportation (Non-Medical): No  Physical Activity: Insufficiently Active (03/04/2023)   Exercise Vital Sign    Days of Exercise per Week: 2 days    Minutes of Exercise per Session: 40 min  Stress: No Stress Concern Present (12/13/2021)   [employer] of Occupational Health - Occupational Stress Questionnaire    Feeling of Stress : Not at all  Social Connections: Moderately Isolated (03/04/2023)   Social Connection and Isolation Panel [NHANES]    Frequency of Communication with Friends and Family: More than three times a week    Frequency of Social Gatherings with Friends and Family: Once a week    Attends Religious Services: More than 4 times per year    Active Member of [employer] or Organizations: No    Attends Banker Meetings: Never    Marital Status: Widowed    Tobacco Counseling Ready to quit: Not Answered Counseling given: Not Answered Tobacco comments: 0.5 PPD   Clinical Intake:  Pre-visit preparation completed: Yes  Pain : No/denies pain     BMI -  recorded: 29.12 Nutritional Status: BMI 25 -29 Overweight Nutritional Risks: None Diabetes: No  How often do you need to have someone help you when you read instructions, pamphlets, or other written materials from your doctor or pharmacy?: 1 - Never  Interpreter Needed?: No  Information entered by :: Tequia Wolman, RMA   Activities of Daily Living    03/04/2023    2:32 PM 06/09/2022    9:08 AM  In your present state of health, do you have any difficulty performing the following activities:  Hearing? 0 0  Vision? 0 0  Difficulty concentrating or making decisions? 0 0  Walking or climbing stairs? 0 0  Dressing or bathing? 0 0  Doing errands, shopping? 0 0  Preparing Food and eating ? N   Using the Toilet? N   In the past six months, have you accidently leaked urine? N   Do you have problems with loss of bowel control? N   Managing your Medications? N   Managing your Finances? N   Housekeeping or managing your Housekeeping? N     Patient Care Team: Dickie La, MD as PCP - General (Internal Medicine)  Indicate any recent Medical Services you may have received from other than [facility] providers in the past year (date may be approximate).     Assessment:   This is a routine wellness examination for Devon Vega.  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 0 0 0 0 0  Number falls in past yr: 0 0 0 0 0  Injury with Fall? 0 0 0 0 0  Risk for fall due to : No Fall Risks No Fall Risks Impaired balance/gait No Fall Risks No Fall Risks  Follow up Falls prevention discussed;Falls evaluation completed Falls evaluation completed;Falls prevention discussed Falls evaluation completed;Falls prevention discussed Falls prevention discussed;Falls evaluation completed Falls evaluation completed    MEDICARE RISK AT HOME: Medicare Risk at Home Any stairs in or around the home?: Yes If so, are there any without handrails?: Yes Home free of loose throw rugs in walkways, pet beds, electrical cords, etc?: Yes Adequate lighting in your home to reduce risk of falls?: Yes Life alert?: No Use of a cane, walker or w/c?: No Grab bars in the bathroom?: No Shower chair or bench in shower?: No Elevated toilet seat or a handicapped toilet?: No  TIMED UP AND GO:  Was the test performed?  No    Cognitive Function:        03/04/2023    2:36 PM 12/13/2021   12:16 PM  6CIT Screen  What Year? 0 points 0 points  What month? 0 points 0 points  What time? 0 points 0 points  Count back from 20 0 points 0 points  Months in reverse 2 points 0 points  Repeat phrase 2 points 0 points  Total Score 4 points 0 points    Immunizations Immunization History  Administered Date(s) Administered   Fluad Quad(high Dose 65+) 05/13/2021   Influenza,inj,Quad PF,6+ Mos 06/18/2016, 04/08/2017, 03/25/2018, 05/30/2019, 04/12/2020   Influenza-Unspecified 04/21/2014, 04/30/2022   PFIZER(Purple Top)SARS-COV-2 Vaccination 09/05/2019, 09/26/2019, 06/14/2020   PNEUMOCOCCAL CONJUGATE-20 11/25/2021   Pneumococcal Conjugate-13 10/11/2020   Tdap 05/06/2014    TDAP status: Up to date  Flu Vaccine  status: Due, Education has been provided regarding the importance of this vaccine. Advised may receive this vaccine at local pharmacy or Health Dept. Aware to provide a copy of the vaccination record if obtained from local pharmacy or Health Dept. Verbalized acceptance and understanding.  Pneumococcal vaccine status: Up to date  Covid-19 vaccine status: Information provided on how to obtain vaccines.   Qualifies for Shingles Vaccine? Yes   Zostavax completed No   Shingrix Completed?: No.    Education has been provided regarding the importance of this vaccine. Patient has been advised to call insurance company to determine out of pocket expense if they have not yet received this vaccine. Advised may also receive vaccine at local pharmacy or Health Dept. Verbalized acceptance and understanding.  Screening Tests Health Maintenance  Topic Date Due  Lung Cancer Screening  Never done   Zoster Vaccines- Shingrix (1 of 2) Never done   Colonoscopy  03/09/2019   COVID-19 Vaccine (4 - 2023-24 season) 03/14/2022   INFLUENZA VACCINE  02/12/2023   Medicare Annual Wellness (AWV)  03/03/2024   DTaP/Tdap/Td (2 - Td or Tdap) 05/06/2024   Pneumonia Vaccine 10+ Years old  Completed   Hepatitis C Screening  Completed   HPV VACCINES  Aged Out    Health Maintenance  Health Maintenance Due  Topic Date Due   Lung Cancer Screening  Never done   Zoster Vaccines- Shingrix (1 of 2) Never done   Colonoscopy  03/09/2019   COVID-19 Vaccine (4 - 2023-24 season) 03/14/2022   INFLUENZA VACCINE  02/12/2023    Colorectal cancer screening: Referral to GI placed for cologuard 03/04/2023. Pt aware the office will call re: appt.  Lung Cancer Screening: (Low Dose CT Chest recommended if Age 38-80 years, 20 pack-year currently smoking OR have quit w/in 15years.) does qualify.   Lung Cancer Screening Referral: Referral requested today.  Additional Screening:  Hepatitis C Screening: does qualify; Completed  12/25/2020  Vision Screening: Recommended annual ophthalmology exams for early detection of glaucoma and other disorders of the eye. Is the patient up to date with their annual eye exam?  No  Who is the provider or what is the name of the office in which the patient attends annual eye exams? N/A If pt is not established with a provider, would they like to be referred to a provider to establish care? Yes .   Dental Screening: Recommended annual dental exams for proper oral hygiene  Community Resource Referral / Chronic Care Management: CRR required this visit?  No   CCM required this visit?  No     Plan:     I have personally reviewed and noted the following in the patient's chart:   Medical and social history Use of alcohol, tobacco or illicit drugs  Current medications and supplements including opioid prescriptions. Patient is not currently taking opioid prescriptions. Functional ability and status Nutritional status Physical activity Advanced directives List of other physicians Hospitalizations, surgeries, and ER visits in previous 12 months Vitals Screenings to include cognitive, depression, and falls Referrals and appointments  In addition, I have reviewed and discussed with patient certain preventive protocols, quality metrics, and best practice recommendations. A written personalized care plan for preventive services as well as general preventive health recommendations were provided to patient.     Cristin Szatkowski L Janaysha Depaulo, CMA   03/04/2023   After Visit Summary: (Mail) Due to this being a telephonic visit, the after visit summary with patients personalized plan was offered to patient via mail   Nurse Notes: Patient is due for Flu, Shingrix and Covid vaccines.  He is aware that he can get these at his pharmacy.  Patient is also due for a lung cancer screening, which I have sent a TE to request for screening.  He is aware that he has to call and schedule appointment.  Patient is  due for a colonoscopy but requesting to do a Cologuard instead.  I sent the request in a TE to provider for referral to be placed.

## 2023-03-05 NOTE — Telephone Encounter (Signed)
Devon Vega is overdue for 6 month f/u with me. Please schedule next available visit, we will plan to discuss screening at that time.

## 2023-03-09 NOTE — Telephone Encounter (Signed)
Left mesage to call the office to schedule a 6 month follow up with Dr. Sol Blazing.

## 2023-04-14 ENCOUNTER — Encounter: Payer: Medicare Other | Admitting: Internal Medicine

## 2023-06-09 ENCOUNTER — Ambulatory Visit: Payer: Medicare Other | Admitting: Internal Medicine

## 2023-06-09 ENCOUNTER — Encounter: Payer: Self-pay | Admitting: Internal Medicine

## 2023-06-09 VITALS — BP 168/102 | HR 104 | Temp 98.5°F | Ht 65.0 in | Wt 174.7 lb

## 2023-06-09 DIAGNOSIS — E1165 Type 2 diabetes mellitus with hyperglycemia: Secondary | ICD-10-CM | POA: Diagnosis present

## 2023-06-09 DIAGNOSIS — I10 Essential (primary) hypertension: Secondary | ICD-10-CM

## 2023-06-09 DIAGNOSIS — R7303 Prediabetes: Secondary | ICD-10-CM | POA: Insufficient documentation

## 2023-06-09 DIAGNOSIS — E785 Hyperlipidemia, unspecified: Secondary | ICD-10-CM | POA: Diagnosis not present

## 2023-06-09 DIAGNOSIS — F17200 Nicotine dependence, unspecified, uncomplicated: Secondary | ICD-10-CM

## 2023-06-09 DIAGNOSIS — F17210 Nicotine dependence, cigarettes, uncomplicated: Secondary | ICD-10-CM | POA: Diagnosis not present

## 2023-06-09 DIAGNOSIS — Z1211 Encounter for screening for malignant neoplasm of colon: Secondary | ICD-10-CM

## 2023-06-09 LAB — GLUCOSE, CAPILLARY: Glucose-Capillary: 224 mg/dL — ABNORMAL HIGH (ref 70–99)

## 2023-06-09 LAB — POCT GLYCOSYLATED HEMOGLOBIN (HGB A1C): Hemoglobin A1C: 6.6 % — AB (ref 4.0–5.6)

## 2023-06-09 NOTE — Patient Instructions (Addendum)
It was wonderful to see you today!  You now have a diagnosis of diabetes. Please work hard on lifestyle changes such as increasing physical activity and increasing healthy foods including lots of fiber to help reduce the risk of diabetes worsening.  You will get a call to schedule a CT scan of your chest to screen for lung cancer.  When you are ready to quit smoking, let me know if you are interested in medications to help you quit!  I will call you next week to review lab results.  Please return your stool kit to test for colon cancer.  Please contact your pharmacy about scheduling the shingles (Shingrix) vaccine.

## 2023-06-09 NOTE — Assessment & Plan Note (Signed)
Remains on rosuvastatin 10 mg for primary prevention. Repeat lipid panel at next visit.

## 2023-06-09 NOTE — Assessment & Plan Note (Signed)
Patient was not interested in completing a colonoscopy for CRC screening, but was agreeable for FIT. Provided with kit today and encouraged to return at next visit. We discussed the need for CSY if test is positive.

## 2023-06-09 NOTE — Assessment & Plan Note (Signed)
Continues to smoke 1/2 ppd, ~26 pack-years. He is not interested in quitting today, precontemplative stage. I have encouraged him to reach out when ready. He is open to lung cancer screening.  Plan -Continue cessation counseling -Referral for LC screening CT chest

## 2023-06-09 NOTE — Assessment & Plan Note (Signed)
History of prediabetes, A1c today in the diabetes range at 6.6%. I reviewed the diagnosis and potential complications of diabetes with the patient. I attempted to discuss lifestyle changes in detail, however patient asked if we could get through the visit faster due to having someone waiting for him. We did review lifestyle changes and the potential need for medication in the future, as well as plans for eye exam referral today. Will plan for closer f/u to continue discussion and monitoring.  Plan -Referral for eye exam -Return in 3 months for A1c, KED, foot exam -Lipid panel at next visit

## 2023-06-09 NOTE — Assessment & Plan Note (Signed)
BP above goal, improved with recheck but still elevated. Devon Vega reports he did not take all of his medications this morning before his appointment. He reports home readings 130/70s but does note he needs to get a new cuff. On medication review, Devon Vega confirmed he has been taking his medication and does not need refills--on further review following today's visit, it appears that several medications should have run out by this time. Entresto no longer on medication list. Will f/u with the patient.  Plan -Continue amlodipine 10 mg, carvedilol 25 mg bid, hydralazine 100 mg tid, spironolactone 25 mg daily, doxasozin 2 mg daily, Imdur 30 mg daily -F/u in 2 weeks for RN BP visit, discuss medication adherence again

## 2023-06-09 NOTE — Progress Notes (Signed)
Established Patient Office Visit  Subjective   Patient ID: Devon Vega, male    DOB: 10/03/1955  Age: 67 y.o. MRN: [id number]  Chief Complaint  Patient presents with   Medical Management of Chronic Issues    Check A1C. Tag on left eye.    Devon Vega returns to clinic today for f/u of chronic conditions. Please see assessment/plan in problem-based charting for further details of today's visit.   Patient Active Problem List   Diagnosis Date Noted   Type 2 diabetes mellitus with hyperglycemia, without long-term current use of insulin (HCC) 06/09/2023   Overweight (BMI 25.0-29.9) 06/09/2022   Sciatica 02/05/2022   Screening for colorectal cancer 05/30/2019   NICM (nonischemic cardiomyopathy) (HCC) 03/01/2017   LVH (left ventricular hypertrophy) 09/05/2015   Sinus arrhythmia 05/24/2014   Healthcare maintenance 07/30/2013   Degenerative disc disease, cervical 10/02/2010   Hyperlipidemia 07/27/2006   Current smoker 07/27/2006   Essential hypertension 07/27/2006      Objective:     BP (!) 168/102 (BP Location: Left Arm, Patient Position: Sitting, Cuff Size: Normal)   Pulse (!) 104   Temp 98.5 F (36.9 C) (Oral)   Ht 5\' 5"  (1.651 m)   Wt 174 lb 11.2 oz (79.2 kg)   SpO2 100% Comment: RA  BMI 29.07 kg/m  BP Readings from Last 3 Encounters:  06/09/23 (!) 168/102  06/09/22 (!) 144/75  02/05/22 137/65   Wt Readings from Last 3 Encounters:  06/09/23 174 lb 11.2 oz (79.2 kg)  03/04/23 175 lb (79.4 kg)  06/09/22 175 lb 14.4 oz (79.8 kg)   Physical Exam Constitutional:      General: He is not in acute distress.    Appearance: Normal appearance.  Eyes:     Comments: Skin tag of the left eyelid--no inflammation or irritation  Pulmonary:     Effort: Pulmonary effort is normal.  Neurological:     General: No focal deficit present.     Mental Status: He is alert.     Gait: Gait normal.  Psychiatric:        Mood and Affect: Mood normal.        Behavior: Behavior  normal.       Assessment & Plan:   Problem List Items Addressed This Visit       Cardiovascular and Mediastinum   Essential hypertension (Chronic)    BP above goal, improved with recheck but still elevated. Devon Vega reports he did not take all of his medications this morning before his appointment. He reports home readings 130/70s but does note he needs to get a new cuff. On medication review, Devon Vega confirmed he has been taking his medication and does not need refills--on further review following today's visit, it appears that several medications should have run out by this time. Entresto no longer on medication list. Will f/u with the patient.  Plan -Continue amlodipine 10 mg, carvedilol 25 mg bid, hydralazine 100 mg tid, spironolactone 25 mg daily, doxasozin 2 mg daily, Imdur 30 mg daily -F/u in 2 weeks for RN BP visit, discuss medication adherence again      Relevant Orders   BMP8+Anion Gap     Endocrine   Type 2 diabetes mellitus with hyperglycemia, without long-term current use of insulin (HCC) - Primary    History of prediabetes, A1c today in the diabetes range at 6.6%. I reviewed the diagnosis and potential complications of diabetes with the patient. I attempted to discuss lifestyle changes in detail,  however patient asked if we could get through the visit faster due to having someone waiting for him. We did review lifestyle changes and the potential need for medication in the future, as well as plans for eye exam referral today. Will plan for closer f/u to continue discussion and monitoring.  Plan -Referral for eye exam -Return in 3 months for A1c, KED, foot exam -Lipid panel at next visit      Relevant Orders   POC Hbg A1C (Completed)   Glucose, capillary (Completed)   Ambulatory referral to Ophthalmology     Other   Hyperlipidemia (Chronic)    Remains on rosuvastatin 10 mg for primary prevention. Repeat lipid panel at next visit.      Current smoker     Continues to smoke 1/2 ppd, ~26 pack-years. He is not interested in quitting today, precontemplative stage. I have encouraged him to reach out when ready. He is open to lung cancer screening.  Plan -Continue cessation counseling -Referral for LC screening CT chest      Relevant Orders   CT CHEST LUNG CA SCREEN LOW DOSE W/O CM   Screening for colorectal cancer    Patient was not interested in completing a colonoscopy for CRC screening, but was agreeable for FIT. Provided with kit today and encouraged to return at next visit. We discussed the need for CSY if test is positive.      Relevant Orders   Fecal occult blood, imunochemical    Return in about 3 months (around 09/09/2023) for fu smoking AND also schedule 2 week RN BP visit.    Dickie La, MD

## 2023-06-10 LAB — BMP8+ANION GAP
Anion Gap: 18 mmol/L (ref 10.0–18.0)
BUN/Creatinine Ratio: 10 (ref 10–24)
BUN: 10 mg/dL (ref 8–27)
CO2: 21 mmol/L (ref 20–29)
Calcium: 9.4 mg/dL (ref 8.6–10.2)
Chloride: 103 mmol/L (ref 96–106)
Creatinine, Ser: 1.03 mg/dL (ref 0.76–1.27)
Glucose: 219 mg/dL — ABNORMAL HIGH (ref 70–99)
Potassium: 4.5 mmol/L (ref 3.5–5.2)
Sodium: 142 mmol/L (ref 134–144)
eGFR: 79 mL/min/{1.73_m2} (ref >59)

## 2023-06-16 NOTE — Progress Notes (Signed)
Hyperglycemia, otherwise normal BMP. Unable to reach Mr. Faggart 12/3 to review and discuss BP medications. Will attempt again.

## 2023-06-16 NOTE — Addendum Note (Signed)
Addended by: Dickie La on: 06/16/2023 10:11 AM   Modules accepted: Orders

## 2023-06-16 NOTE — Progress Notes (Signed)
Spoke with Devon Vega 12/3. He was able to find all blood pressure medications in his home supply and reports taking them all as prescribed. He is planning to get a new BP cuff today, will monitor and report for RN BP visit on 12/10.

## 2023-06-23 ENCOUNTER — Ambulatory Visit: Payer: Medicare Other

## 2023-07-16 ENCOUNTER — Other Ambulatory Visit: Payer: Self-pay | Admitting: Internal Medicine

## 2023-07-16 DIAGNOSIS — I10 Essential (primary) hypertension: Secondary | ICD-10-CM

## 2023-07-16 DIAGNOSIS — I428 Other cardiomyopathies: Secondary | ICD-10-CM

## 2023-07-27 ENCOUNTER — Encounter: Payer: Self-pay | Admitting: Internal Medicine

## 2023-08-06 ENCOUNTER — Ambulatory Visit: Payer: Medicare Other

## 2024-04-22 ENCOUNTER — Inpatient Hospital Stay (HOSPITAL_COMMUNITY)

## 2024-04-22 ENCOUNTER — Encounter (HOSPITAL_COMMUNITY): Payer: Self-pay | Admitting: Internal Medicine

## 2024-04-22 ENCOUNTER — Other Ambulatory Visit: Payer: Self-pay

## 2024-04-22 ENCOUNTER — Emergency Department (HOSPITAL_COMMUNITY)

## 2024-04-22 ENCOUNTER — Inpatient Hospital Stay (HOSPITAL_COMMUNITY)
Admission: EM | Admit: 2024-04-22 | Discharge: 2024-04-24 | DRG: 291 | Disposition: A | Discharge status: Home / Self Care | Attending: Infectious Diseases | Admitting: Infectious Diseases

## 2024-04-22 DIAGNOSIS — E1169 Type 2 diabetes mellitus with other specified complication: Secondary | ICD-10-CM | POA: Diagnosis not present

## 2024-04-22 DIAGNOSIS — N179 Acute kidney failure, unspecified: Secondary | ICD-10-CM | POA: Diagnosis present

## 2024-04-22 DIAGNOSIS — E1165 Type 2 diabetes mellitus with hyperglycemia: Secondary | ICD-10-CM | POA: Diagnosis present

## 2024-04-22 DIAGNOSIS — Z91128 Patient's intentional underdosing of medication regimen for other reason: Secondary | ICD-10-CM

## 2024-04-22 DIAGNOSIS — I351 Nonrheumatic aortic (valve) insufficiency: Secondary | ICD-10-CM | POA: Diagnosis present

## 2024-04-22 DIAGNOSIS — I110 Hypertensive heart disease with heart failure: Secondary | ICD-10-CM | POA: Diagnosis present

## 2024-04-22 DIAGNOSIS — E785 Hyperlipidemia, unspecified: Secondary | ICD-10-CM | POA: Diagnosis not present

## 2024-04-22 DIAGNOSIS — Z716 Tobacco abuse counseling: Secondary | ICD-10-CM

## 2024-04-22 DIAGNOSIS — J449 Chronic obstructive pulmonary disease, unspecified: Secondary | ICD-10-CM | POA: Diagnosis present

## 2024-04-22 DIAGNOSIS — I10 Essential (primary) hypertension: Secondary | ICD-10-CM | POA: Diagnosis present

## 2024-04-22 DIAGNOSIS — I161 Hypertensive emergency: Secondary | ICD-10-CM | POA: Diagnosis present

## 2024-04-22 DIAGNOSIS — I358 Other nonrheumatic aortic valve disorders: Secondary | ICD-10-CM | POA: Diagnosis present

## 2024-04-22 DIAGNOSIS — E782 Mixed hyperlipidemia: Secondary | ICD-10-CM | POA: Diagnosis present

## 2024-04-22 DIAGNOSIS — E119 Type 2 diabetes mellitus without complications: Secondary | ICD-10-CM

## 2024-04-22 DIAGNOSIS — R000 Tachycardia, unspecified: Secondary | ICD-10-CM | POA: Diagnosis present

## 2024-04-22 DIAGNOSIS — F17210 Nicotine dependence, cigarettes, uncomplicated: Secondary | ICD-10-CM | POA: Diagnosis present

## 2024-04-22 DIAGNOSIS — Z7984 Long term (current) use of oral hypoglycemic drugs: Secondary | ICD-10-CM | POA: Diagnosis not present

## 2024-04-22 DIAGNOSIS — D72829 Elevated white blood cell count, unspecified: Secondary | ICD-10-CM | POA: Diagnosis present

## 2024-04-22 DIAGNOSIS — I160 Hypertensive urgency: Secondary | ICD-10-CM

## 2024-04-22 DIAGNOSIS — Z823 Family history of stroke: Secondary | ICD-10-CM | POA: Diagnosis not present

## 2024-04-22 DIAGNOSIS — F17200 Nicotine dependence, unspecified, uncomplicated: Secondary | ICD-10-CM | POA: Diagnosis present

## 2024-04-22 DIAGNOSIS — Z8419 Family history of other disorders of kidney and ureter: Secondary | ICD-10-CM

## 2024-04-22 DIAGNOSIS — I5021 Acute systolic (congestive) heart failure: Secondary | ICD-10-CM | POA: Diagnosis present

## 2024-04-22 DIAGNOSIS — F1090 Alcohol use, unspecified, uncomplicated: Secondary | ICD-10-CM | POA: Diagnosis present

## 2024-04-22 DIAGNOSIS — M79645 Pain in left finger(s): Secondary | ICD-10-CM | POA: Diagnosis not present

## 2024-04-22 DIAGNOSIS — Z66 Do not resuscitate: Secondary | ICD-10-CM | POA: Diagnosis present

## 2024-04-22 DIAGNOSIS — Z820 Family history of epilepsy and other diseases of the nervous system: Secondary | ICD-10-CM

## 2024-04-22 DIAGNOSIS — Z91148 Patient's other noncompliance with medication regimen for other reason: Secondary | ICD-10-CM | POA: Diagnosis not present

## 2024-04-22 DIAGNOSIS — I428 Other cardiomyopathies: Secondary | ICD-10-CM | POA: Diagnosis present

## 2024-04-22 DIAGNOSIS — Z8249 Family history of ischemic heart disease and other diseases of the circulatory system: Secondary | ICD-10-CM

## 2024-04-22 DIAGNOSIS — R7303 Prediabetes: Secondary | ICD-10-CM | POA: Diagnosis present

## 2024-04-22 DIAGNOSIS — I5030 Unspecified diastolic (congestive) heart failure: Secondary | ICD-10-CM | POA: Diagnosis not present

## 2024-04-22 DIAGNOSIS — Z7982 Long term (current) use of aspirin: Secondary | ICD-10-CM | POA: Diagnosis not present

## 2024-04-22 DIAGNOSIS — J810 Acute pulmonary edema: Secondary | ICD-10-CM | POA: Diagnosis present

## 2024-04-22 LAB — ECHOCARDIOGRAM COMPLETE
Area-P 1/2: 9.14 cm2
Calc EF: 26.1 %
Height: 65 in
MV Vena cont: 0.3 cm
P 1/2 time: 577 ms
S' Lateral: 5.3 cm
Single Plane A2C EF: 22.5 %
Single Plane A4C EF: 39.7 %
Weight: 2640 [oz_av]

## 2024-04-22 LAB — BASIC METABOLIC PANEL WITH GFR
Anion gap: 11 (ref 5–15)
BUN: 11 mg/dL (ref 8–23)
CO2: 22 mmol/L (ref 22–32)
Calcium: 8.9 mg/dL (ref 8.9–10.3)
Chloride: 105 mmol/L (ref 98–111)
Creatinine, Ser: 1.26 mg/dL — ABNORMAL HIGH (ref 0.61–1.24)
GFR, Estimated: >60 mL/min (ref >60)
Glucose, Bld: 153 mg/dL — ABNORMAL HIGH (ref 70–99)
Potassium: 4.3 mmol/L (ref 3.5–5.1)
Sodium: 138 mmol/L (ref 135–145)

## 2024-04-22 LAB — CBC WITH DIFFERENTIAL/PLATELET
Abs Immature Granulocytes: 0.03 K/uL (ref 0.00–0.07)
Basophils Absolute: 0.1 K/uL (ref 0.0–0.1)
Basophils Relative: 1 %
Eosinophils Absolute: 0.1 K/uL (ref 0.0–0.5)
Eosinophils Relative: 2 %
HCT: 51.4 % (ref 39.0–52.0)
Hemoglobin: 16.1 g/dL (ref 13.0–17.0)
Immature Granulocytes: 0 %
Lymphocytes Relative: 19 %
Lymphs Abs: 1.7 K/uL (ref 0.7–4.0)
MCH: 28.7 pg (ref 26.0–34.0)
MCHC: 31.3 g/dL (ref 30.0–36.0)
MCV: 91.6 fL (ref 80.0–100.0)
Monocytes Absolute: 0.7 K/uL (ref 0.1–1.0)
Monocytes Relative: 8 %
Neutro Abs: 6.2 K/uL (ref 1.7–7.7)
Neutrophils Relative %: 70 %
Platelets: 273 K/uL (ref 150–400)
RBC: 5.61 MIL/uL (ref 4.22–5.81)
RDW: 14.9 % (ref 11.5–15.5)
WBC: 8.9 K/uL (ref 4.0–10.5)
nRBC: 0 % (ref 0.0–0.2)

## 2024-04-22 LAB — GLUCOSE, CAPILLARY
Glucose-Capillary: 114 mg/dL — ABNORMAL HIGH (ref 70–99)
Glucose-Capillary: 95 mg/dL (ref 70–99)

## 2024-04-22 LAB — CBC
HCT: 47.1 % (ref 39.0–52.0)
Hemoglobin: 14.8 g/dL (ref 13.0–17.0)
MCH: 29 pg (ref 26.0–34.0)
MCHC: 31.4 g/dL (ref 30.0–36.0)
MCV: 92.2 fL (ref 80.0–100.0)
Platelets: 239 K/uL (ref 150–400)
RBC: 5.11 MIL/uL (ref 4.22–5.81)
RDW: 15 % (ref 11.5–15.5)
WBC: 11.8 K/uL — ABNORMAL HIGH (ref 4.0–10.5)
nRBC: 0 % (ref 0.0–0.2)

## 2024-04-22 LAB — TROPONIN I (HIGH SENSITIVITY)
Troponin I (High Sensitivity): 20 ng/L — ABNORMAL HIGH (ref <18)
Troponin I (High Sensitivity): 27 ng/L — ABNORMAL HIGH (ref <18)

## 2024-04-22 LAB — COMPREHENSIVE METABOLIC PANEL WITH GFR
ALT: 31 U/L (ref 0–44)
AST: 41 U/L (ref 15–41)
Albumin: 3.8 g/dL (ref 3.5–5.0)
Alkaline Phosphatase: 90 U/L (ref 38–126)
Anion gap: 15 (ref 5–15)
BUN: 13 mg/dL (ref 8–23)
CO2: 18 mmol/L — ABNORMAL LOW (ref 22–32)
Calcium: 8.9 mg/dL (ref 8.9–10.3)
Chloride: 105 mmol/L (ref 98–111)
Creatinine, Ser: 1.5 mg/dL — ABNORMAL HIGH (ref 0.61–1.24)
GFR, Estimated: 50 mL/min — ABNORMAL LOW (ref >60)
Glucose, Bld: 243 mg/dL — ABNORMAL HIGH (ref 70–99)
Potassium: 4 mmol/L (ref 3.5–5.1)
Sodium: 138 mmol/L (ref 135–145)
Total Bilirubin: 0.7 mg/dL (ref 0.0–1.2)
Total Protein: 7.3 g/dL (ref 6.5–8.1)

## 2024-04-22 LAB — RESP PANEL BY RT-PCR (RSV, FLU A&B, COVID)  RVPGX2
Influenza A by PCR: NEGATIVE
Influenza B by PCR: NEGATIVE
Resp Syncytial Virus by PCR: NEGATIVE
SARS Coronavirus 2 by RT PCR: NEGATIVE

## 2024-04-22 LAB — CBG MONITORING, ED
Glucose-Capillary: 168 mg/dL — ABNORMAL HIGH (ref 70–99)
Glucose-Capillary: 146 mg/dL — ABNORMAL HIGH (ref 70–99)

## 2024-04-22 LAB — HEMOGLOBIN A1C
Hgb A1c MFr Bld: 5.9 % — ABNORMAL HIGH (ref 4.8–5.6)
Mean Plasma Glucose: 122.63 mg/dL

## 2024-04-22 LAB — I-STAT VENOUS BLOOD GAS, ED
Acid-base deficit: 4 mmol/L — ABNORMAL HIGH (ref 0.0–2.0)
Bicarbonate: 23.1 mmol/L (ref 20.0–28.0)
Calcium, Ion: 1.1 mmol/L — ABNORMAL LOW (ref 1.15–1.40)
HCT: 51 % (ref 39.0–52.0)
Hemoglobin: 17.3 g/dL — ABNORMAL HIGH (ref 13.0–17.0)
O2 Saturation: 98 %
Potassium: 4 mmol/L (ref 3.5–5.1)
Sodium: 139 mmol/L (ref 135–145)
TCO2: 25 mmol/L (ref 22–32)
pCO2, Ven: 49.4 mmHg (ref 44–60)
pH, Ven: 7.279 (ref 7.25–7.43)
pO2, Ven: 121 mmHg — ABNORMAL HIGH (ref 32–45)

## 2024-04-22 LAB — BRAIN NATRIURETIC PEPTIDE: B Natriuretic Peptide: 1365.8 pg/mL — ABNORMAL HIGH (ref 0.0–100.0)

## 2024-04-22 LAB — HIV ANTIBODY (ROUTINE TESTING W REFLEX): HIV Screen 4th Generation wRfx: NONREACTIVE

## 2024-04-22 MED ORDER — PERFLUTREN LIPID MICROSPHERE
1.0000 mL | INTRAVENOUS | Status: AC | PRN
  Administered 2024-04-22: 2 mL via INTRAVENOUS

## 2024-04-22 MED ORDER — ROSUVASTATIN CALCIUM 5 MG PO TABS
10.0000 mg | ORAL_TABLET | Freq: Every day | ORAL | Status: DC
  Administered 2024-04-22 – 2024-04-24 (×3): 10 mg via ORAL
  Filled 2024-04-22 (×4): qty 2

## 2024-04-22 MED ORDER — AMLODIPINE BESYLATE 10 MG PO TABS
10.0000 mg | ORAL_TABLET | Freq: Every day | ORAL | Status: DC
  Administered 2024-04-22 – 2024-04-24 (×3): 10 mg via ORAL
  Filled 2024-04-22: qty 2
  Filled 2024-04-22 (×2): qty 1

## 2024-04-22 MED ORDER — ENOXAPARIN SODIUM 40 MG/0.4ML IJ SOSY
40.0000 mg | PREFILLED_SYRINGE | INTRAMUSCULAR | Status: DC
  Administered 2024-04-23 – 2024-04-24 (×2): 40 mg via SUBCUTANEOUS
  Filled 2024-04-22 (×3): qty 0.4

## 2024-04-22 MED ORDER — ALBUTEROL SULFATE (2.5 MG/3ML) 0.083% IN NEBU: 2.5000 mg | INHALATION_SOLUTION | RESPIRATORY_TRACT | Status: DC | PRN

## 2024-04-22 MED ORDER — ACETAMINOPHEN 325 MG PO TABS
650.0000 mg | ORAL_TABLET | Freq: Four times a day (QID) | ORAL | Status: DC | PRN
  Administered 2024-04-22: 650 mg via ORAL
  Filled 2024-04-22: qty 2

## 2024-04-22 MED ORDER — INSULIN ASPART 100 UNIT/ML IJ SOLN
0.0000 [IU] | Freq: Three times a day (TID) | INTRAMUSCULAR | Status: DC
  Administered 2024-04-22: 3 [IU] via SUBCUTANEOUS
  Administered 2024-04-22 – 2024-04-24 (×2): 2 [IU] via SUBCUTANEOUS

## 2024-04-22 MED ORDER — INSULIN ASPART 100 UNIT/ML IJ SOLN: 0.0000 [IU] | Freq: Every day | INTRAMUSCULAR | Status: DC

## 2024-04-22 MED ORDER — NITROGLYCERIN IN D5W 200-5 MCG/ML-% IV SOLN
0.0000 ug/min | INTRAVENOUS | Status: DC
  Administered 2024-04-22: 40 ug/min via INTRAVENOUS
  Filled 2024-04-22: qty 250

## 2024-04-22 MED ORDER — ALBUTEROL SULFATE (2.5 MG/3ML) 0.083% IN NEBU
5.0000 mg | INHALATION_SOLUTION | Freq: Once | RESPIRATORY_TRACT | Status: AC
Start: 2024-04-22 — End: 2024-04-22
  Administered 2024-04-22: 5 mg via RESPIRATORY_TRACT
  Filled 2024-04-22: qty 6

## 2024-04-22 MED ORDER — NICOTINE 14 MG/24HR TD PT24
14.0000 mg | MEDICATED_PATCH | Freq: Every day | TRANSDERMAL | Status: DC
  Administered 2024-04-22 – 2024-04-24 (×3): 14 mg via TRANSDERMAL
  Filled 2024-04-22 (×3): qty 1

## 2024-04-22 MED ORDER — BENZONATATE 100 MG PO CAPS
100.0000 mg | ORAL_CAPSULE | Freq: Three times a day (TID) | ORAL | Status: DC | PRN
  Administered 2024-04-22: 100 mg via ORAL
  Filled 2024-04-22: qty 1

## 2024-04-22 NOTE — Progress Notes (Addendum)
 HD#0 SUBJECTIVE:  Patient Summary: Devon Vega is a 68 y.o. with a pertinent PMH of type 2 diabetes, COPD, NICM, HTN, HLD, tobacco use disorder, who presented with shortness of breath and admitted for flash pulmonary edema.   Overnight Events: Admitted overnight  Interim History: Patient seen bedside.  No current complaints.  Denies chest pain, shortness of breath, abdominal pain, issues with voiding, issues with bowel movements. Today he thinks he is breathing well but had hard night.  He has been off his medications for 6 months because he is stubborn and also they cause him headaches, specifically the 100 mg one.  He believes that this and the quantity of medications that he takes is affecting his ability to be adherent.  Currently and outside of this hospitalization, he denied any episodes of dizziness, lightheadedness, or hypoglycemic episodes.  OBJECTIVE:  Vital Signs: Vitals:   04/22/24 1400 04/22/24 1445 04/22/24 1515 04/22/24 1624  BP: (!) 154/92 (!) 142/85 (!) 142/85 (!) 184/98  Pulse: (!) 103 89 92 (!) 109  Resp: (!) 26 19 17 19   Temp:    98 F (36.7 C)  TempSrc:    Oral  SpO2: 99% 91% 99% 99%  Weight:      Height:       Supplemental O2: Room Air SpO2: 99 % FiO2 (%): 40 %  Filed Weights   04/22/24 0250  Weight: 74.8 kg     Intake/Output Summary (Last 24 hours) at 04/22/2024 1700 Last data filed at 04/22/2024 0257 Gross per 24 hour  Intake 27.01 ml  Output --  Net 27.01 ml   Net IO Since Admission: 27.01 mL [04/22/24 1700]  Physical Exam: Physical Exam Cardiovascular:     Rate and Rhythm: Regular rhythm. Tachycardia present.     Heart sounds: No murmur heard.    No friction rub. No gallop.  Pulmonary:     Effort: Pulmonary effort is normal.     Breath sounds: Examination of the right-lower field reveals rales. Examination of the left-lower field reveals rales. Rales present. No decreased breath sounds, wheezing or rhonchi.  Abdominal:      Palpations: Abdomen is soft.     Tenderness: There is no abdominal tenderness.  Musculoskeletal:     Right lower leg: No edema.     Left lower leg: No edema.  Skin:    General: Skin is warm and dry.  Neurological:     Mental Status: He is alert.     Patient Lines/Drains/Airways Status     Active Line/Drains/Airways     Name Placement date Placement time Site Days   Peripheral IV 04/22/24 20 G Anterior;Right Forearm 04/22/24  0023  Forearm  less than 1   Peripheral IV 04/22/24 20 G Left Antecubital 04/22/24  0005  Antecubital  less than 1            Pertinent labs and imaging:      Latest Ref Rng & Units 04/22/2024    5:33 AM 04/22/2024   12:52 AM 04/22/2024   12:10 AM  CBC  WBC 4.0 - 10.5 K/uL 11.8   8.9   Hemoglobin 13.0 - 17.0 g/dL 85.1  82.6  83.8   Hematocrit 39.0 - 52.0 % 47.1  51.0  51.4   Platelets 150 - 400 K/uL 239   273        Latest Ref Rng & Units 04/22/2024    5:33 AM 04/22/2024   12:52 AM 04/22/2024   12:10 AM  CMP  Glucose 70 - 99 mg/dL 846   756   BUN 8 - 23 mg/dL 11   13   Creatinine 9.38 - 1.24 mg/dL 8.73   8.49   Sodium 864 - 145 mmol/L 138  139  138   Potassium 3.5 - 5.1 mmol/L 4.3  4.0  4.0   Chloride 98 - 111 mmol/L 105   105   CO2 22 - 32 mmol/L 22   18   Calcium  8.9 - 10.3 mg/dL 8.9   8.9   Total Protein 6.5 - 8.1 g/dL   7.3   Total Bilirubin 0.0 - 1.2 mg/dL   0.7   Alkaline Phos 38 - 126 U/L   90   AST 15 - 41 U/L   41   ALT 0 - 44 U/L   31     DG Chest Port 1 View Result Date: 04/22/2024 CLINICAL DATA:  sob EXAM: PORTABLE CHEST 1 VIEW COMPARISON:  Chest x-ray 03/18/2018 FINDINGS: The heart and mediastinal contours are unchanged. No focal consolidation. Pulmonary edema. No pleural effusion. No pneumothorax. No acute osseous abnormality. IMPRESSION: Cardiomegaly with pulmonary edema. Electronically Signed   By: Morgane  Naveau M.D.   On: 04/22/2024 00:28    ASSESSMENT/PLAN:  Assessment: Principal Problem:   Hypertensive  emergency Active Problems:   Current smoker   Essential hypertension   NICM (nonischemic cardiomyopathy) (HCC)   Prediabetes   Flash pulmonary edema (HCC)   Plan: Flash pulmonary edema 2/2 medication nonadherence and hypertensive emergency Medication non-adherence HF recovered EF (echo 03/2021 EF 60-65% with aortic regurg) Completed nitroglycerin  infusion taper.  Currently on amlodipine  10 mg. On physical exam cackles present on bilateral bases. At bedside, patient was asymptomatic.  Denied any chest pain, shortness of breath.  Medication nonadherence with hypertensive emergency driving admission presentation.  Systolic blood pressures ranging from 142-184.  Will restart home medications based on echo results.  Will decrease polypharmacy depending on echo results to encourage adherence. Considerations for medications include ACE/ARB to help with preload and afterload.  Additionally, Imdur  is likely contributing to headaches, however patient stated that the medication was 100 mg meaning that the hydralazine  is the likely culprit. considering SGLT2 in setting of prediabetes and heart failure. - Echo ordered, pending results - Follow-up outpatient  AKI Creatinine on admission 1.5, decreased to 1.26. - Follow-up with a.m. labs  COPD Tobacco use disorder -Albuterol inhaler as needed -Counseled on smoking cessation  Type 2 diabetes A1c 5.9.  Denied any episodes of hypoglycemia prior to admission.  Glucose on admission was 243.  Consider SGLT2i on discharge for HF and DM - Sliding scale moderate with nighttime coverage  HLD -Continued to rosuvastatin  10 mg  Best Practice: Diet: Regular diet VTE: enoxaparin  (LOVENOX ) injection 40 mg Start: 04/22/24 1000 Code: DNR limited  Disposition planning: Therapy Recs: None, DME: none DISPO: Anticipated discharge tomorrow to Home   Signature:  Keldon Lassen [facility]  5:00 PM, 04/22/2024  On Call pager [phone]

## 2024-04-22 NOTE — ED Provider Notes (Signed)
 [place] EMERGENCY DEPARTMENT AT [facility] Provider Note   CSN: [id number] Arrival date & time: 04/22/24  0004     Patient presents with: Shortness of Breath   Devon Vega is a 68 y.o. male.   The history is provided by the patient, the EMS personnel and medical records.  Devon Vega is a 68 y.o. male who presents to the Emergency Department complaining of difficulty breathing. He presents the emergency department by EMS for evaluation of difficulty breathing that started about one hour prior to ED arrival. He reports sudden onset shortness of breath without any associated chest pain, abdominal pain, nausea, vomiting. He does have a history of COPD and continues to smoke. He also has a history of hypertension and did miss his evening medication. No prior similar symptoms. EMS report sats of 91% on their arrival. He was treated with Solu-Medrol , magnesium, duo nebs and placed on CPAP for respiratory support.     Prior to Admission medications   Not on File    Allergies: Codeine and Lisinopril    Review of Systems  All other systems reviewed and are negative.   Updated Vital Signs BP (!) 162/90   Pulse 92   Temp 98.1 F (36.7 C)   Resp (!) 24   Ht 5' 5 (1.651 m)   Wt 74.8 kg   SpO2 99%   BMI 27.46 kg/m   Physical Exam Vitals and nursing note reviewed.  Constitutional:      General: He is in acute distress.     Appearance: He is well-developed. He is ill-appearing and diaphoretic.  HENT:     Head: Normocephalic and atraumatic.  Cardiovascular:     Rate and Rhythm: Normal rate and regular rhythm.     Heart sounds: No murmur heard. Pulmonary:     Effort: Respiratory distress present.     Comments: Diffuse rhonchi Abdominal:     Palpations: Abdomen is soft.     Tenderness: There is no abdominal tenderness. There is no guarding or rebound.  Musculoskeletal:        General: No tenderness.  Skin:    General: Skin is warm.   Neurological:     Mental Status: He is alert and oriented to person, place, and time.  Psychiatric:        Behavior: Behavior normal.     (all labs ordered are listed, but only abnormal results are displayed) Labs Reviewed  COMPREHENSIVE METABOLIC PANEL WITH GFR - Abnormal; Notable for the following components:      Result Value   CO2 18 (*)    Glucose, Bld 243 (*)    Creatinine, Ser 1.50 (*)    GFR, Estimated 50 (*)    All other components within normal limits  BRAIN NATRIURETIC PEPTIDE - Abnormal; Notable for the following components:   B Natriuretic Peptide 1,365.8 (*)    All other components within normal limits  BASIC METABOLIC PANEL WITH GFR - Abnormal; Notable for the following components:   Glucose, Bld 153 (*)    Creatinine, Ser 1.26 (*)    All other components within normal limits  CBC - Abnormal; Notable for the following components:   WBC 11.8 (*)    All other components within normal limits  HEMOGLOBIN A1C - Abnormal; Notable for the following components:   Hgb A1c MFr Bld 5.9 (*)    All other components within normal limits  I-STAT VENOUS BLOOD GAS, ED - Abnormal; Notable for the following components:  pO2, Ven 121 (*)    Acid-base deficit 4.0 (*)    Calcium , Ion 1.10 (*)    Hemoglobin 17.3 (*)    All other components within normal limits  TROPONIN I (HIGH SENSITIVITY) - Abnormal; Notable for the following components:   Troponin I (High Sensitivity) 20 (*)    All other components within normal limits  TROPONIN I (HIGH SENSITIVITY) - Abnormal; Notable for the following components:   Troponin I (High Sensitivity) 27 (*)    All other components within normal limits  RESP PANEL BY RT-PCR (RSV, FLU A&B, COVID)  RVPGX2  CBC WITH DIFFERENTIAL/PLATELET  HIV ANTIBODY (ROUTINE TESTING W REFLEX)    EKG: EKG Interpretation Date/Time:  Friday April 22 2024 00:12:17 EDT Ventricular Rate:  135 PR Interval:  74 QRS Duration:  92 QT Interval:  382 QTC  Calculation: 573 R Axis:   160  Text Interpretation: Right and left arm electrode reversal, interpretation assumes no reversal Sinus tachycardia Ventricular premature complex Probable left atrial enlargement Consider left ventricular hypertrophy Repol abnrm suggests ischemia, diffuse leads ST elevation, consider anterior injury Prolonged QT interval Confirmed by Griselda Norris [phone]) on 04/22/2024 12:14:58 AM  Radiology: [facility] Chest Port 1 View Result Date: 04/22/2024 CLINICAL DATA:  sob EXAM: PORTABLE CHEST 1 VIEW COMPARISON:  Chest x-ray 03/18/2018 FINDINGS: The heart and mediastinal contours are unchanged. No focal consolidation. Pulmonary edema. No pleural effusion. No pneumothorax. No acute osseous abnormality. IMPRESSION: Cardiomegaly with pulmonary edema. Electronically Signed   By: Morgane  Naveau M.D.   On: 04/22/2024 00:28     Procedures  CRITICAL CARE Performed by: Norris Griselda   Total critical care time: 50 minutes  Critical care time was exclusive of separately billable procedures and treating other patients.  Critical care was necessary to treat or prevent imminent or life-threatening deterioration.  Critical care was time spent personally by me on the following activities: development of treatment plan with patient and/or surrogate as well as nursing, discussions with consultants, evaluation of patient's response to treatment, examination of patient, obtaining history from patient or surrogate, ordering and performing treatments and interventions, ordering and review of laboratory studies, ordering and review of radiographic studies, pulse oximetry and re-evaluation of patient's condition.  Medications Ordered in the ED  nitroGLYCERIN  50 mg in dextrose 5 % 250 mL (0.2 mg/mL) infusion (0 mcg/min Intravenous Stopped 04/22/24 0328)  enoxaparin  (LOVENOX ) injection 40 mg (has no administration in time range)  insulin aspart (novoLOG) injection 0-15 Units (has no administration  in time range)  insulin aspart (novoLOG) injection 0-5 Units (has no administration in time range)  amLODipine  (NORVASC ) tablet 10 mg (10 mg Oral Given 04/22/24 0354)  acetaminophen  (TYLENOL ) tablet 650 mg (has no administration in time range)  albuterol (PROVENTIL) (2.5 MG/3ML) 0.083% nebulizer solution 2.5 mg (has no administration in time range)  rosuvastatin  (CRESTOR ) tablet 10 mg (has no administration in time range)  albuterol (PROVENTIL) (2.5 MG/3ML) 0.083% nebulizer solution 5 mg (5 mg Nebulization Given 04/22/24 0014)                                    Medical Decision Making Amount and/or Complexity of Data Reviewed Labs: ordered. Radiology: ordered. ECG/medicine tests: ordered.  Risk Prescription drug management. Decision regarding hospitalization.   Patient with history of COPD, hypertension here for evaluation of sudden onset difficulty breathing. Patient in extremis at time of ED arrival with diaphoresis, tachypnea, accessory  muscle use and diffuse wheezes and rhonchi. He was transition to BiPAP for respiratory support. He was significantly hypertensive and he was started on nitroglycerin . He was also treated with albuterol for possible COPD exacerbation component. Chest x-ray with pulmonary edema. EKG was sinus tachycardia. Initial troponin is mildly elevated. On repeat evaluation after BiPAP and nitroglycerin  initiation with improved blood pressures patient has significant improvement in his work of breathing, diaphoresis has resolved his blood pressures are down trending. Repeat lung exam with lungs clear bilaterally. Will trial off of BiPAP. Current picture is not consistent with pneumonia, PE, dissection, ACS. Suspect patient with flash pulmonary edema secondary to hypertensive urgency. Medicine consulted for admission for ongoing care.     Final diagnoses:  Flash pulmonary edema [facility])  Hypertensive urgency    ED Discharge Orders     None          Griselda Norris, MD 04/22/24 [phone]

## 2024-04-22 NOTE — ED Notes (Signed)
 Called CCMD and put pt on monitor

## 2024-04-22 NOTE — Progress Notes (Signed)
 Echo results: EF 20-25%. The left ventricle has severely decreased function. The left ventricle  demonstrates global hypokinesis. Cardiology is onboard. Recommendation will follow.  Sallyanne Primas, DO [facility] - PGY1

## 2024-04-22 NOTE — Progress Notes (Signed)
  Echocardiogram 2D Echocardiogram has been performed.  Norleen ORN Lateya Dauria 04/22/2024, 1:39 PM

## 2024-04-22 NOTE — Progress Notes (Signed)
 Patient weaned off BiPAP per MD.  Patient on RA satting 97%.  Patient tolerating well at this time.

## 2024-04-22 NOTE — Plan of Care (Signed)
   Problem: Coping: Goal: Ability to adjust to condition or change in health will improve Outcome: Progressing   Problem: Metabolic: Goal: Ability to maintain appropriate glucose levels will improve Outcome: Progressing   Problem: Nutritional: Goal: Maintenance of adequate nutrition will improve Outcome: Progressing

## 2024-04-22 NOTE — ED Notes (Signed)
 CCMD called to report patient transfer to RM 3E24.

## 2024-04-22 NOTE — ED Notes (Signed)
 Called CCMD for cardiac monitoring

## 2024-04-22 NOTE — ED Notes (Signed)
 Took over patient care, resting, no complaints at this time.

## 2024-04-22 NOTE — ED Triage Notes (Signed)
 Pt bibgcems from home c/o sob x1 hr. Hypertensive with ems pt has not taken bp meds this evening. Ems gave 2g mag sulfate, 125mg  solumedrol. Pt placed on cpap with ems  218/102 91% ra 100% w/ duoneb 141 hr

## 2024-04-22 NOTE — Hospital Course (Addendum)
 Today he states he feels very well. He denied SOB, CP, H/A or any new concern today, he was able to walk without any problem and also had normal urination and bowl movement. Cardiology visited him and now he has good understanding of his situation. All discharge medication, cardiology F/U and also call for PCP F/U appt were explained to him.     Flash pulmonary edema 2/2 medication nonadherence and hypertensive emergency Medication non-adherence HFrEF (EF 20 to 25%) Presented to the ED with shortness of breath and without chest pain.  Stated that he stopped taking his blood pressure medications 6 months ago.  He received 2 g Mg, 125 mg Solu-Medrol , and CPAP by EMS.  In the ED he received albuterol and nitroglycerin  50 mg infusion.  Blood pressure elevated with systolics >210 and diastolic >100.  Chest x-ray showed cardiomegaly with pulmonary edema.  Flash pulmonary edema likely secondary to medication nonadherence and hypertensive emergency.  When meeting this patient for admission, his symptoms had completely resolved.  He completed nitroglycerin  infusion taper and was started on amlodipine  10 mg. On physical exam cackles present on bilateral bases.  Echo ordered and showed EF 20 to 25%, LV severely decreased function.  LV demonstrates global hypokinesis.  LV moderately dilated.  RV systolic function normal.  Mildly elevated pulmonary artery systolic pressure.  Mild calcification of aortic valve.  Aortic valve regurg mild.  Had previous EF of 30 to 35% in 2018 with normal coronary arteries. Cardiology was consulted and patient was started Lasix  20 mg IV one-time dose, Entresto  24-26 mg twice daily, amlodipine  10 mg daily, metoprolol succinate 25 mg daily, spironolactone  12.5 mg daily, Jardiance 10 mg daily.  heart cath***  AKI Creatinine on admission 1.5, decreased to 1.26.  Baseline appears around 1.0.  Likely prerenal 2/2 uncontrolled blood pressure.  Creatinine on discharge***   COPD Tobacco use  disorder 27-year, 10 cigarettes/day smoker.  Counseled on smoking cessation.  Patient understood he needed to quit and wanted to be started on nicotine  patches.  Nicotine  patches ordered during hospitalization and will discharge with nicotine  patches***   Type 2 diabetes A1c 5.9 on admission.  Denied any episodes of hypoglycemia prior to admission.  Received SSI.  Started on SGLT2i during hospitalization.    HLD Restarted on rosuvastatin  10 mg.  Lipid panel 04/24/2024***   --------------------------------------------------------------------------------------------  You came to the hospital for shortness of breath.  We treated you with medications and restarted your blood pressure medications.  Please start taking the following medications:  - Amlodipine  10 mg, take 1 tablet by mouth daily - Aspirin  81 mg, take 1 tablet by mouth daily - Jardiance 10 mg, take 1 tablet by mouth daily - Metoprolol succinate 25 mg, take 1 tablet by mouth daily - Crestor  20 mg, take 1 tablet by mouth daily - Entresto  24/26 mg, take 1 tab by mouth 2 times daily - Spironolactone  25 mg, take half a tablet by mouth daily   Please make sure you schedule an appointment with the heart doctor.  If you have any of these following symptoms, please call us  or seek care at an emergency department: -Chest Pain -Difficulty Breathing -Worsening abdominal pain -Syncope (passing out) -Drooping of face -Slurred speech -Sudden weakness in your leg or arm -Fever -Chills -blood in the stool -dark black, sticky stool  If you have any questions or concerns, call our clinic at [phone], [phone] or after hours call [phone] and ask for the internal medicine resident on call.  I  am glad you are feeling better. It was a pleasure taking care for you. I wish a good recovery and good health!   Dr. Toma Edwards   =======================================================  Disposition   Acute HFrEF w/ hx of  NICM: Discharged on GBMT medications. Please Titrate GDMT and stop amlodipine  in the outpatient setting. Consider Titrating Entresto  first.   Primary hypertension presenting with hypertensive emergency in the setting of medication noncompliance: Ensure adherence to medications.  Mixed hyperlipidemia: LDL not at goal: Discharged on Crestor  20 mg daily.  Ensure tobacco cessation  Ensure alcohol cessation.

## 2024-04-22 NOTE — ED Notes (Addendum)
 Called RT to remove BIPAP per MD

## 2024-04-22 NOTE — H&P (Addendum)
 Date: 04/22/2024               Patient Name:  Devon Vega MRN: [id number]  DOB: Apr 21, 1956 Age / Sex: 68 y.o., male   PCP: Karna Fellows, MD         Medical Service: Internal Medicine Teaching Service         Attending Physician: Dr. Mliss Pouch      First Contact: Viktoria King, DO    Second Contact: Dr. Roetta Chars, MD          Pager Information: First Contact Pager: [phone]   Second Contact Pager: [phone]   SUBJECTIVE   Chief Complaint: SOB  History of Present Illness: Devon Vega is a 68 y.o. male with PMH of T2DM, COPD, NICM, HTN, HLD, and tobacco use disorder.   Patient stopped taking all medications about 6 months ago. There is no reason why, he says he gets his medicines basically free, he just stopped taking them.   Presents with shortness of breath that started while he was lying in his recliner at home. He has had these SOB episodes for the last two days. The episodes start suddenly when he is in his recliner or lying in bed. They happen in the evening and early in the morning. He usually sleeps with two pillows and prefers to watch TV sitting up, elevated but he does not remember feeling SOB when he lies flat.   Over the last two days, he knows an episode is starting because he will start sweating and then become SOB. The symptoms resolve after he goes outside and breathes the outside air and cools off. He denies any recent stressors or physical activity that coincide with the episodes. He does not take any medications to relieve his symptoms.  Tonight, he felt an episode starting but it was not relieved by going outside. His SOB worsened and so he decided to come to the ED. He denies chest pain, new back pain, new vision changes, abdominal pain, leg swelling, runny nose, sick contacts, dysuria, dysphagia or sore throat. He has a chronic cough with clear mucus, denies new cough with yellow sputum production.   EMS gave him 2 g Mg, 125 mg solumedrol and  put him on CPAP. BP 218/102, 91% on RA, 100% with duoneb.   ED Course: Labs significant for  Glucose 243, CO2 18, Cr 1.5, GFR 50 Trop 20 BNP 1365.8 VBG pO2 121  Imaging  CXR Cardiomeg w pulm edema  Received  Albuterol and nitroglycerin  50 mg infusion  Consulted IMTS  Meds:  Patient reported: Stopped taking medications 6 months ago, no reason indicated  Amlodipine  10 mg daily ASA 81 mg daily Carvedilol  25 mg BID Doxazosin  1 mg daily  Fluticasone  nasal spray Hydralazine  100 mg TID Isosorbide  mononitrate 30 mg daily Methocarbamol  500 mg BID prn Rosuvastatin  10 mg daily Sacubitril -valsartan  97-103 BID Spironolactone  25 mg daily    No outpatient medications have been marked as taking for the 04/22/24 encounter [facility] Encounter).    Past Medical History T2DM, sciatica, NICM, cervical DDD, HLD, HTN, tobacco use disorder   Past Surgical History Past Surgical History:  Procedure Laterality Date   COLONOSCOPY     EXCISION METACARPAL MASS Right 02/03/2013   Procedure: WIDE RESECTION OF GRANULAR CELL TUMOR;  Surgeon: Lamar LULLA Leonor Mickey., MD;  Location: [facility];  Service: Orthopedics;  Laterality: Right;   LYMPH NODE BIOPSY  2011   lt axilla-negative   MASS EXCISION Right  01/21/2013   Procedure: BIOPSY OF MASS RIGHT RING FINGER;  Surgeon: Lamar LULLA Leonor Mickey., MD;  Location: [facility];  Service: Orthopedics;  Laterality: Right;   RIGHT/LEFT HEART CATH AND CORONARY ANGIOGRAPHY N/A 03/02/2017   Procedure: RIGHT/LEFT HEART CATH AND CORONARY ANGIOGRAPHY;  Surgeon: Cherrie Toribio SAUNDERS, MD;  Location: [facility];  Service: Cardiovascular;  Laterality: N/A;   SKIN FULL THICKNESS GRAFT Right 02/03/2013   Procedure: SKIN GRAFT FULL THICKNESS RIGHT RING FINGER;  Surgeon: Lamar LULLA Leonor Mickey., MD;  Location: [facility];  Service: Orthopedics;  Laterality: Right;     Social:  Lives With: alone Occupation: retired, worked for  [employer] for 41 years in [employer] Support: sister Level of Function: independent in ADLs and iADLs PCP:  Karna Fellows, MD  Substances: -Tobacco: ~27 years, 10 cigarettes/d -Alcohol: one beer nightly -Recreational Drug: none  Family History:  Family History  Problem Relation Age of Onset   Cancer Father        Lung   Parkinson's disease Father    Renal Disease Brother        On Dialysis   Heart failure Brother    Hypertension Brother    Hypertension Mother    Stroke Brother      Allergies: Allergies as of 04/22/2024 - Review Complete 04/22/2024  Allergen Reaction Noted   Codeine Cough    Lisinopril Cough 02/22/2010    Review of Systems: A complete ROS was negative except as per HPI.   OBJECTIVE:   Physical Exam: Blood pressure (!) 186/102, pulse (!) 111, temperature (!) 97.3 F (36.3 C), temperature source Axillary, resp. rate (!) 31, SpO2 100%.  Constitutional: well-appearing male sitting in ED bed, in no acute distress HENT: normocephalic atraumatic, mucous membranes moist Eyes: conjunctiva non-erythematous, non icteric Neck: supple Cardiovascular: tachycardia, regular rhythm, no m/r/g, 1+ L DP, 2+ R DP, no pitting edema, negative JVD Pulmonary/Chest: normal work of breathing on room air, lungs with mild BL bibasilar crackles Abdominal: soft, non-tender, non-distended, + BS MSK: normal bulk Neurological: alert & oriented x 3, 5/5 strength in bilateral upper and lower extremities, normal gait Skin: warm and dry Psych: mood and affect normal and congruent   Labs: CBC    Component Value Date/Time   WBC 8.9 04/22/2024 0010   RBC 5.61 04/22/2024 0010   HGB 17.3 (H) 04/22/2024 0052   HGB 14.7 03/10/2017 1140   HCT 51.0 04/22/2024 0052   HCT 44.8 03/10/2017 1140   PLT 273 04/22/2024 0010   PLT 335 03/10/2017 1140   MCV 91.6 04/22/2024 0010   MCV 86 03/10/2017 1140   MCH 28.7 04/22/2024 0010   MCHC 31.3 04/22/2024 0010   RDW 14.9 04/22/2024 0010   RDW 16.2  (H) 03/10/2017 1140   LYMPHSABS 1.7 04/22/2024 0010   MONOABS 0.7 04/22/2024 0010   EOSABS 0.1 04/22/2024 0010   BASOSABS 0.1 04/22/2024 0010     CMP     Component Value Date/Time   NA 139 04/22/2024 0052   NA 142 06/09/2023 1027   K 4.0 04/22/2024 0052   CL 105 04/22/2024 0010   CO2 18 (L) 04/22/2024 0010   GLUCOSE 243 (H) 04/22/2024 0010   BUN 13 04/22/2024 0010   BUN 10 06/09/2023 1027   CREATININE 1.50 (H) 04/22/2024 0010   CREATININE 1.01 12/22/2013 1533   CALCIUM  8.9 04/22/2024 0010   PROT 7.3 04/22/2024 0010   ALBUMIN 3.8 04/22/2024 0010   AST 41 04/22/2024  0010   ALT 31 04/22/2024 0010   ALKPHOS 90 04/22/2024 0010   BILITOT 0.7 04/22/2024 0010   GFRNONAA 50 (L) 04/22/2024 0010   GFRNONAA 88 09/29/2013 1355   GFRAA 100 04/12/2020 1537   GFRAA >89 09/29/2013 1355    Imaging: DG Chest Port 1 View Result Date: 04/22/2024 CLINICAL DATA:  sob EXAM: PORTABLE CHEST 1 VIEW COMPARISON:  Chest x-ray 03/18/2018 FINDINGS: The heart and mediastinal contours are unchanged. No focal consolidation. Pulmonary edema. No pleural effusion. No pneumothorax. No acute osseous abnormality. IMPRESSION: Cardiomegaly with pulmonary edema. Electronically Signed   By: Morgane  Naveau M.D.   On: 04/22/2024 00:28     EKG: personally reviewed my interpretation is ST elevation in anterior leads upon arrival, repeat EKG without ST elevations   ASSESSMENT & PLAN:   Assessment & Plan by Problem: Principal Problem:   Flash pulmonary edema (HCC) Active Problems:   Current smoker   Essential hypertension   NICM (nonischemic cardiomyopathy) (HCC)   Type 2 diabetes mellitus with hyperglycemia, without long-term current use of insulin (HCC)   Devon Vega is a 68 y.o. person living with a history of T2DM, COPD, NICM, HTN, HLD, and tobacco use disorder who presented with SOB and admitted for flash pulmonary edema on hospital day 0  Flash pulmonary edema 2/2 acute HFE History of nonischemic  cardiomyopathy due to HTN. Last echo 03/2019 LVEF 60-65% with aortic regurg. Patient stopped taking all his medications 6 months ago. He continues to smoke 10 cigarettes a day. His SOB episodes do not coincide with exertion and happen suddenly. The episode that happened earlier tonight happened suddenly while watching TV. BP 221/126, HR 138, RR 35 with quick improvement in signs and symptoms after BiPAP and nitroglycerin  infusion. RVP negative. BNP 1365.8 and CXR with pulm edema. Patient had ST elevations on arrival with resolution as his BP has come down, likely demand ischemia. Trops 20>27. Not concerned for ACS at this time. Patient denies chest pain. Patient was comfortable, able to engage in lengthy conversation, without any pain, and euvolemic. I favor sympathetic crashing acute pulm edema. Consider diuresis if patient becomes hypervolemic. Will slowly taper off IV nitroglycerin  to amlodipine . Plan to start home PO BP regimen while admitted that he can be discharged on. Patient willing to take his medicines on discharge. May need repeat echo, likely can be done outpatient. -BMP and CBC in am -cardiac monitor -albuterol inhaler prn -nitroglycerin  infusion taper with amlodipine  10 mg tablet  -monitor for rebound hypertension, hypotension, dizziness, headache, and recurrence of SOB  HTN NICM Patient last seen in [facility] on 05/2023. NICM and HTN regimen at this time included amlodipine  10 mg daily, carvedilol  25 mg BID, hydralazine  100 mg TID, spironolactone  25 mg daily, doxasozin 2 mg daily, Imdur  30 mg daily. Per chart, patient has taken ASA 81 mg, although unclear why, he doesn't have history of MI or CVA. He has had uncontrolled HTN and medication nonadherence since at least 2012. Has tried several BP medications. Does not appear to have ever been worked up for secondary HTN. Pressures have steadily dropped since arrival with improvement in his EKG and SOB symptoms. I suspect his high afterload caused  abrupt redistribution of fluid into his lungs with resulting SOB. We discussed the importance of restarting his home medications, he expresses understanding and is willing to take his medications again.  -nitroglycerin  infusion taper with amlodipine  10 mg tablet  -restart home GDMT  COPD Patient does not have any inhalers  at home but he would like to have one. Significant smoking history, currently 10 cigarettes per day. He has a chronic cough with clear mucus. He did hear wheezing during his episode tonight but this resolved after nebulizer, Mg, and methylprednisolone . He denies URI symptoms or yellow mucus. Lungs with mild bibasilar crackles on exam, did not appreciate wheezing or increased work of breathing. I suspect some component of COPD exacerbation that contributed to his symptoms although primary etiology likely cardiac.  -albuterol inhaler prn -tobacco cessation counseling   T2DM Last A1c 6.6 10 months ago. Was supposed to follow up with PCP in 08/2023 for kidney evaluation and foot exam although never did. Glucose 243 on admission. Will do SSI while inpatient. Consider starting SGLT2i on discharge for HF and DM.  -A1c, BMP -SSI moderate with nighttime coverage  -lifestyle modification counseling   HLD Last lipid panel 05/2022. Home regimen previously consisted of rosuvastatin  10 mg for primary prevention. High risk for cardiovascular event. Will resume home regimen, goal < 100.  -Rosuvastatin  10 mg   Tobacco Use Disorder He notices that when he smokes he starts coughing. Smokes 10 cigarettes a day. Has been smoking for about 27 years. Consider evaluating behavior change status. -tobacco cessation counseling   Best practice: Diet: Heart Healthy VTE: Enoxaparin  IVF: None,None Code: DNR/DNI  Disposition planning: Prior to Admission Living Arrangement: Home, living alone Anticipated Discharge Location: Home  Dispo: Admit patient to Observation with expected length of stay less  than 2 midnights.  Signed: Charmayne Holmes, DO Internal Medicine Resident  04/22/2024, 3:28 AM  On Call pager: [phone]

## 2024-04-23 DIAGNOSIS — I161 Hypertensive emergency: Secondary | ICD-10-CM | POA: Diagnosis not present

## 2024-04-23 DIAGNOSIS — I5021 Acute systolic (congestive) heart failure: Secondary | ICD-10-CM | POA: Diagnosis not present

## 2024-04-23 DIAGNOSIS — Z91148 Patient's other noncompliance with medication regimen for other reason: Secondary | ICD-10-CM

## 2024-04-23 DIAGNOSIS — N179 Acute kidney failure, unspecified: Secondary | ICD-10-CM

## 2024-04-23 DIAGNOSIS — E782 Mixed hyperlipidemia: Secondary | ICD-10-CM

## 2024-04-23 DIAGNOSIS — J449 Chronic obstructive pulmonary disease, unspecified: Secondary | ICD-10-CM

## 2024-04-23 DIAGNOSIS — J810 Acute pulmonary edema: Secondary | ICD-10-CM | POA: Diagnosis not present

## 2024-04-23 DIAGNOSIS — E785 Hyperlipidemia, unspecified: Secondary | ICD-10-CM

## 2024-04-23 DIAGNOSIS — I5030 Unspecified diastolic (congestive) heart failure: Secondary | ICD-10-CM

## 2024-04-23 DIAGNOSIS — F17210 Nicotine dependence, cigarettes, uncomplicated: Secondary | ICD-10-CM

## 2024-04-23 LAB — BASIC METABOLIC PANEL WITH GFR
Anion gap: 11 (ref 5–15)
BUN: 16 mg/dL (ref 8–23)
CO2: 22 mmol/L (ref 22–32)
Calcium: 8.9 mg/dL (ref 8.9–10.3)
Chloride: 105 mmol/L (ref 98–111)
Creatinine, Ser: 1.2 mg/dL (ref 0.61–1.24)
GFR, Estimated: >60 mL/min (ref >60)
Glucose, Bld: 97 mg/dL (ref 70–99)
Potassium: 4.5 mmol/L (ref 3.5–5.1)
Sodium: 138 mmol/L (ref 135–145)

## 2024-04-23 LAB — CBC
HCT: 43 % (ref 39.0–52.0)
Hemoglobin: 14 g/dL (ref 13.0–17.0)
MCH: 28.9 pg (ref 26.0–34.0)
MCHC: 32.6 g/dL (ref 30.0–36.0)
MCV: 88.8 fL (ref 80.0–100.0)
Platelets: 198 K/uL (ref 150–400)
RBC: 4.84 MIL/uL (ref 4.22–5.81)
RDW: 15 % (ref 11.5–15.5)
WBC: 12.8 K/uL — ABNORMAL HIGH (ref 4.0–10.5)
nRBC: 0 % (ref 0.0–0.2)

## 2024-04-23 LAB — GLUCOSE, CAPILLARY
Glucose-Capillary: 111 mg/dL — ABNORMAL HIGH (ref 70–99)
Glucose-Capillary: 106 mg/dL — ABNORMAL HIGH (ref 70–99)
Glucose-Capillary: 95 mg/dL (ref 70–99)
Glucose-Capillary: 108 mg/dL — ABNORMAL HIGH (ref 70–99)

## 2024-04-23 LAB — RENAL FUNCTION PANEL
Albumin: 3.3 g/dL — ABNORMAL LOW (ref 3.5–5.0)
Anion gap: 11 (ref 5–15)
BUN: 15 mg/dL (ref 8–23)
CO2: 23 mmol/L (ref 22–32)
Calcium: 8.9 mg/dL (ref 8.9–10.3)
Chloride: 103 mmol/L (ref 98–111)
Creatinine, Ser: 1.14 mg/dL (ref 0.61–1.24)
GFR, Estimated: >60 mL/min (ref >60)
Glucose, Bld: 97 mg/dL (ref 70–99)
Phosphorus: 3.3 mg/dL (ref 2.5–4.6)
Potassium: 4 mmol/L (ref 3.5–5.1)
Sodium: 137 mmol/L (ref 135–145)

## 2024-04-23 MED ORDER — METOPROLOL SUCCINATE ER 25 MG PO TB24
25.0000 mg | ORAL_TABLET | Freq: Every day | ORAL | Status: DC
  Administered 2024-04-23 – 2024-04-24 (×2): 25 mg via ORAL
  Filled 2024-04-23 (×2): qty 1

## 2024-04-23 MED ORDER — FUROSEMIDE 10 MG/ML IJ SOLN
20.0000 mg | Freq: Once | INTRAMUSCULAR | Status: AC
  Administered 2024-04-23: 20 mg via INTRAVENOUS
  Filled 2024-04-23: qty 2

## 2024-04-23 MED ORDER — SACUBITRIL-VALSARTAN 24-26 MG PO TABS
1.0000 | ORAL_TABLET | Freq: Two times a day (BID) | ORAL | Status: DC
  Administered 2024-04-23 – 2024-04-24 (×3): 1 via ORAL
  Filled 2024-04-23 (×3): qty 1

## 2024-04-23 MED ORDER — ASPIRIN 81 MG PO TBEC
81.0000 mg | DELAYED_RELEASE_TABLET | Freq: Every day | ORAL | Status: DC
  Administered 2024-04-23 – 2024-04-24 (×2): 81 mg via ORAL
  Filled 2024-04-23 (×2): qty 1

## 2024-04-23 MED ORDER — FUROSEMIDE 10 MG/ML IJ SOLN: 40.0000 mg | Freq: Once | INTRAMUSCULAR | Status: DC

## 2024-04-23 MED ORDER — SPIRONOLACTONE 12.5 MG HALF TABLET
12.5000 mg | ORAL_TABLET | Freq: Every day | ORAL | Status: DC
  Administered 2024-04-23 – 2024-04-24 (×2): 12.5 mg via ORAL
  Filled 2024-04-23 (×2): qty 1

## 2024-04-23 MED ORDER — EMPAGLIFLOZIN 10 MG PO TABS
10.0000 mg | ORAL_TABLET | Freq: Every day | ORAL | Status: DC
  Administered 2024-04-23 – 2024-04-24 (×2): 10 mg via ORAL
  Filled 2024-04-23 (×2): qty 1

## 2024-04-23 NOTE — Plan of Care (Signed)

## 2024-04-23 NOTE — Plan of Care (Signed)
  Problem: Fluid Volume: Goal: Ability to maintain a balanced intake and output will improve Outcome: Progressing   Problem: Metabolic: Goal: Ability to maintain appropriate glucose levels will improve Outcome: Progressing   Problem: Nutritional: Goal: Maintenance of adequate nutrition will improve Outcome: Progressing   Problem: Clinical Measurements: Goal: Ability to maintain clinical measurements within normal limits will improve Outcome: Progressing

## 2024-04-23 NOTE — Progress Notes (Addendum)
 HD#1 SUBJECTIVE:  Patient Summary: Devon Vega is a 68 y.o. with a pertinent PMH of type 2 diabetes, COPD, NICM, HTN, HLD, tobacco use disorder, who presented with shortness of breath and admitted for flash pulmonary edema.   Overnight Events: Coughing.  Given Tessalon Perles  Interim History: Patient assessed bedside.  Stated that he was feeling well.  Denied shortness of breath or chest pain.  Endorsed some new pain to his left thumb.  Denied working with his hands or spending a lot of time at the computer.  Overall said he feels well.  OBJECTIVE:  Vital Signs: Vitals:   04/22/24 1624 04/22/24 1929 04/22/24 2323 04/23/24 0328  BP: (!) 184/98 (!) 145/89 (!) 149/86 129/85  Pulse: (!) 109 92 87   Resp: 19   18  Temp: 98 F (36.7 C) 98.1 F (36.7 C) 98.7 F (37.1 C) (!) 97.5 F (36.4 C)  TempSrc: Oral Oral Oral Oral  SpO2: 99% 99% 96% 96%  Weight:      Height:       Supplemental O2: Room Air SpO2: 96 % FiO2 (%): 40 %  Filed Weights   04/22/24 0250  Weight: 74.8 kg     Intake/Output Summary (Last 24 hours) at 04/23/2024 0542 Last data filed at 04/22/2024 1819 Gross per 24 hour  Intake 120 ml  Output --  Net 120 ml   Net IO Since Admission: 147.01 mL [04/23/24 0542]  Physical Exam: Physical Exam Cardiovascular:     Rate and Rhythm: Regular rhythm. Tachycardia present.     Heart sounds: No murmur heard.    No friction rub. No gallop.  Pulmonary:     Effort: Pulmonary effort is normal.     Breath sounds: Examination of the right-lower field reveals rales. Examination of the left-lower field reveals rales. Rales present. No decreased breath sounds, wheezing or rhonchi.  Abdominal:     Palpations: Abdomen is soft.     Tenderness: There is no abdominal tenderness.  Musculoskeletal:     Right lower leg: No edema.     Left lower leg: No edema.  Skin:    General: Skin is warm and dry.  Neurological:     Mental Status: He is alert.     Patient  Lines/Drains/Airways Status     Active Line/Drains/Airways     Name Placement date Placement time Site Days   Peripheral IV 04/22/24 20 G Anterior;Right Forearm 04/22/24  0023  Forearm  less than 1   Peripheral IV 04/22/24 20 G Left Antecubital 04/22/24  0005  Antecubital  less than 1            Pertinent labs and imaging:      Latest Ref Rng & Units 04/23/2024    2:41 AM 04/22/2024    5:33 AM 04/22/2024   12:52 AM  CBC  WBC 4.0 - 10.5 K/uL 12.8  11.8    Hemoglobin 13.0 - 17.0 g/dL 85.9  85.1  82.6   Hematocrit 39.0 - 52.0 % 43.0  47.1  51.0   Platelets 150 - 400 K/uL 198  239         Latest Ref Rng & Units 04/23/2024    2:42 AM 04/23/2024    2:41 AM 04/22/2024    5:33 AM  CMP  Glucose 70 - 99 mg/dL 97  97  846   BUN 8 - 23 mg/dL 15  16  11    Creatinine 0.61 - 1.24 mg/dL 8.85  8.79  1.26   Sodium 135 - 145 mmol/L 137  138  138   Potassium 3.5 - 5.1 mmol/L 4.0  4.5  4.3   Chloride 98 - 111 mmol/L 103  105  105   CO2 22 - 32 mmol/L 23  22  22    Calcium  8.9 - 10.3 mg/dL 8.9  8.9  8.9     ECHOCARDIOGRAM COMPLETE Result Date: 04/22/2024    ECHOCARDIOGRAM REPORT   Patient Name:   Devon Vega Date of Exam: 04/22/2024 Medical Rec #:  [id number]          Height:       65.0 in Accession #:    [id number]         Weight:       165.0 lb Date of Birth:  [birth year]          BSA:          1.823 m Patient Age:    68 years           BP:           165/90 mmHg Patient Gender: M                  HR:           112 bpm. Exam Location:  Inpatient Procedure: 2D Echo and Intracardiac Opacification Agent (Both Spectral and Color            Flow Doppler were utilized during procedure). Indications:    CHF  History:        Patient has prior history of Echocardiogram examinations. CHF.  Sonographer:    Norleen Amour Referring Phys: [phone] JULIE ANNE WILLIAMS IMPRESSIONS  1. Left ventricular ejection fraction, by estimation, is 20 to 25%. The left ventricle has severely decreased function. The  left ventricle demonstrates global hypokinesis. The left ventricular internal cavity size was moderately dilated. Indeterminate diastolic filling due to E-A fusion.  2. Right ventricular systolic function is normal. The right ventricular size is normal. There is mildly elevated pulmonary artery systolic pressure.  3. Left atrial size was moderately dilated.  4. The mitral valve is normal in structure. Mild mitral valve regurgitation. No evidence of mitral stenosis.  5. The aortic valve is tricuspid. There is mild calcification of the aortic valve. Aortic valve regurgitation is mild. No aortic stenosis is present.  6. The inferior vena cava is normal in size with <50% respiratory variability, suggesting right atrial pressure of 8 mmHg. Comparison(s): Changes from prior study are noted. LVEF now severely reduced. Conclusion(s)/Recommendation(s): No left ventricular mural or apical thrombus/thrombi. Severely reduced LVEF. Findings communicated to ordering provider. FINDINGS  Left Ventricle: Left ventricular ejection fraction, by estimation, is 20 to 25%. The left ventricle has severely decreased function. The left ventricle demonstrates global hypokinesis. Definity contrast agent was given IV to delineate the left ventricular endocardial borders. The left ventricular internal cavity size was moderately dilated. There is no left ventricular hypertrophy. Indeterminate diastolic filling due to E-A fusion. Right Ventricle: The right ventricular size is normal. No increase in right ventricular wall thickness. Right ventricular systolic function is normal. There is mildly elevated pulmonary artery systolic pressure. The tricuspid regurgitant velocity is 2.66  m/s, and with an assumed right atrial pressure of 8 mmHg, the estimated right ventricular systolic pressure is 36.3 mmHg. Left Atrium: Left atrial size was moderately dilated. Right Atrium: Right atrial size was normal in size. Pericardium: There is no evidence of  pericardial effusion. Mitral Valve:  The mitral valve is normal in structure. Mild mitral valve regurgitation. No evidence of mitral valve stenosis. Tricuspid Valve: The tricuspid valve is normal in structure. Tricuspid valve regurgitation is mild . No evidence of tricuspid stenosis. Aortic Valve: The aortic valve is tricuspid. There is mild calcification of the aortic valve. Aortic valve regurgitation is mild. Aortic regurgitation PHT measures 577 msec. No aortic stenosis is present. Pulmonic Valve: The pulmonic valve was grossly normal. Pulmonic valve regurgitation is mild. No evidence of pulmonic stenosis. Aorta: The aortic root and ascending aorta are structurally normal, with no evidence of dilitation. Venous: The inferior vena cava is normal in size with less than 50% respiratory variability, suggesting right atrial pressure of 8 mmHg. IAS/Shunts: The atrial septum is grossly normal.  LEFT VENTRICLE PLAX 2D LVIDd:         6.00 cm      Diastology LVIDs:         5.30 cm      LV e' medial:    5.22 cm/s LV PW:         1.10 cm      LV E/e' medial:  19.9 LV IVS:        0.80 cm      LV e' lateral:   8.05 cm/s LVOT diam:     2.10 cm      LV E/e' lateral: 12.9 LV SV:         42 LV SV Index:   23 LVOT Area:     3.46 cm  LV Volumes (MOD) LV vol d, MOD A2C: 173.0 ml LV vol d, MOD A4C: 179.0 ml LV vol s, MOD A2C: 134.0 ml LV vol s, MOD A4C: 108.0 ml LV SV MOD A2C:     39.0 ml LV SV MOD A4C:     179.0 ml LV SV MOD BP:      45.9 ml RIGHT VENTRICLE            IVC RV Basal diam:  3.00 cm    IVC diam: 1.40 cm RV S prime:     9.46 cm/s TAPSE (M-mode): 1.7 cm LEFT ATRIUM             Index        RIGHT ATRIUM          Index LA diam:        4.00 cm 2.19 cm/m   RA Area:     8.86 cm LA Vol (A2C):   65.5 ml 35.93 ml/m  RA Volume:   18.30 ml 10.04 ml/m LA Vol (A4C):   59.9 ml 32.86 ml/m LA Biplane Vol: 66.0 ml 36.21 ml/m  AORTIC VALVE             PULMONIC VALVE LVOT Vmax:   99.00 cm/s  PR End Diast Vel: 17.81 msec LVOT Vmean:   60.100 cm/s LVOT VTI:    0.121 m AI PHT:      577 msec  AORTA Ao Root diam: 3.00 cm Ao Asc diam:  2.70 cm MITRAL VALVE                TRICUSPID VALVE MV Area (PHT): 9.14 cm     TR Peak grad:   28.3 mmHg MV Decel Time: 83 msec      TR Vmax:        266.00 cm/s MR Vena Contracta: 0.30 cm MV E velocity: 104.00 cm/s  SHUNTS  Systemic VTI:  0.12 m                             Systemic Diam: 2.10 cm Shelda Bruckner MD Electronically signed by Shelda Bruckner MD Signature Date/Time: 04/22/2024/5:18:23 PM    Final     ASSESSMENT/PLAN:  Assessment: Principal Problem:   Hypertensive emergency Active Problems:   Current smoker   Essential hypertension   NICM (nonischemic cardiomyopathy) (HCC)   Prediabetes   Flash pulmonary edema (HCC)   Plan: Flash pulmonary edema 2/2 medication nonadherence and hypertensive emergency Medication non-adherence HF recovered EF (echo 03/2021 EF 60-65% with aortic regurg) Nonadherent to home blood pressure medications for the past 6 months. Echo 20 to 25% LV severely decreased function.  LV demonstrates global hypokinesis.  LV moderately dilated.  RV systolic function normal.  Mildly elevated pulmonary artery systolic pressure.  Mild calcification of aortic valve.  Aortic valve regurg mild.  Had previous EF of 30 to 35% in 2018 with normal coronary arteries. Home medications of GDMT included Entresto , carvedilol , spironolactone .  Counseled patient on results of echo and encouraged adherence to new blood pressure medications per cardiology. [employer] cardiology - Cardiology recommendations and orders:              - Given one-time dose of IV Lasix  20 mg             - Entresto  24-to 26 mg twice daily (ordered)             - Amlodipine  10 mg daily (ordered)             - Metoprolol succinate 25 mg daily (ordered)             - Spironolactone  12.5 mg daily (ordered)             - Jardiance 10 mg daily (ordered)             -  Considering right/left heart cath given reduced EF - Follow-up outpatient with PCP and cardiology  Leukocytosis WBC 12.8 (11.8).  Patient has remained afebrile and has no current complaints.  Will continue to monitor.  Acute left thumb pain - Performed OMT along flexor retinaculum with complete resolution of symptoms  AKI Creatinine on admission 1.26-->1.14.  Baseline appears around 1.0.  Likely prerenal 2/2 uncontrolled blood pressure. - Trending down, will follow-up with a.m. BMP  COPD Tobacco use disorder On evaluation by cardiology PA wheezing was noted.  Reminded patient to use albuterol as needed -Albuterol inhaler as needed -Counseled on smoking cessation  HLD -Continued to rosuvastatin  10 mg  Best Practice: Diet: Regular diet VTE: enoxaparin  (LOVENOX ) injection 40 mg Start: 04/22/24 1000 Code: DNR limited  Disposition planning: Therapy Recs: None, DME: none DISPO: Anticipated discharge pending cardiology recommendations  Signature:  Shakina Choy [facility]  5:42 AM, 04/23/2024  On Call pager [phone]

## 2024-04-23 NOTE — Consult Note (Signed)
 Cardiology Consultation   Patient ID: Devon Vega MRN: [id number]; DOB: 08-03-1955  Admit date: 04/22/2024 Date of Consult: 04/23/2024  PCP:  Karna Fellows, MD   [facility] Providers Cardiologist: Patient previously followed by Dr. Cherrie but has not been seen since 2020   Patient Profile: Devon Vega is a 68 y.o. male with a hx of nonischemic cardiomyopathy/heart failure with improved ejection fraction, COPD, type 2 diabetes, hypertension, hyperlipidemia, tobacco use who is being seen 04/23/2024 for the evaluation of CHF with reduced EF at the request of Dr. Eben.  History of Present Illness: Devon Vega is a 68 year old male with above medical history.  Patient was previously followed by the advanced heart failure clinic but has been lost to follow-up since 2020.  He had been admitted in 2018 with new onset CHF.  Echocardiogram 02/27/17 showed EF 30-35% with mild-moderate concentric hypertrophy, no significant valvular abnormalities.  He underwent right and left heart catheterization on 03/02/17 that showed normal coronary arteries, low filling pressures with moderately to severely decreased cardiac output.  It was felt that patient's nonischemic cardiomyopathy was due to hypertension.  He was hydrated post cath and discharged on GDMT.  While on GDMT had improvement in EF.  Echocardiogram from 03/2019 showed EF 60-65%, normal LV diastolic parameters, normal RV systolic function, mild-moderate AI.  Patient presented to the ED on 04/22/2024 complaining of shortness of breath.  When EMS arrived, BP was 218/102.  Oxygen 91% on room air, improved to 100% with DuoNeb treatment.  Heart rate 141 bpm. EKG in the ED showed sinus tachycardia with heart rate 135 bpm, PVC present, LVH with repolarizaiton abnormality.  Labs significant for BNP 1365.  High-sensitivity troponin 20, 27.  Creatinine 1.5.  CBC within normal limits.  COVID, flu, RSV negative.  Chest x-ray showed  cardiomegaly with pulmonary edema.  Patient was admitted to the internal medicine teaching service for CHF exacerbation.  Told admitting team that patient had stopped taking all of his medications about 6 months ago.  There was no real reason why, his medications were basically free.  He just stopped taking them.  His home occasions were resumed including amlodipine  10 mg daily, Entresto  24-26 mg twice daily, spironolactone  12.5 mg daily, Crestor  10 mg daily.  Was not started back on beta-blocker due to CHF exacerbation.  He has not yet received any Lasix .  Echocardiogram 04/22/2024 showed EF 20-25%, normal RV systolic function, mildly elevated PA systolic pressure, mild MR, mild AI.  On interview, patient tells me that he stopped taking all of his medications about 6 months ago. He did not have a specific reason for stopping. He started to have shortness of breath on exertion and orthopnea a few days ago. These symptoms progressed and he decided to come to the ED for evaluation. He denies having any chest pain, dizziness, syncope, near syncope, or palpitations. He is currently feeling much better. His breathing has improved and he can now lay flat without difficulty. He denies drug use. Drinks about 2-3 beers per day. Continues to smoke.   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   Nerve compression 12/09/2018   Prediabetes 03/10/2022   Right shoulder  pain 03/25/2018   Sinus arrhythmia 05/24/2014   Tobacco abuse    failed zyban   Weight gain     Past Surgical History:  Procedure Laterality Date   COLONOSCOPY     EXCISION METACARPAL MASS Right 02/03/2013   Procedure: WIDE RESECTION OF GRANULAR CELL TUMOR;  Surgeon: Lamar LULLA Leonor Mickey., MD;  Location: [facility];  Service: Orthopedics;  Laterality: Right;   LYMPH NODE BIOPSY  2011   lt axilla-negative   MASS EXCISION Right 01/21/2013   Procedure: BIOPSY OF MASS RIGHT RING FINGER;  Surgeon: Lamar LULLA Leonor Mickey., MD;  Location: [facility];  Service: Orthopedics;  Laterality: Right;   RIGHT/LEFT HEART CATH AND CORONARY ANGIOGRAPHY N/A 03/02/2017   Procedure: RIGHT/LEFT HEART CATH AND CORONARY ANGIOGRAPHY;  Surgeon: Cherrie Toribio SAUNDERS, MD;  Location: [facility];  Service: Cardiovascular;  Laterality: N/A;   SKIN FULL THICKNESS GRAFT Right 02/03/2013   Procedure: SKIN GRAFT FULL THICKNESS RIGHT RING FINGER;  Surgeon: Lamar LULLA Leonor Mickey., MD;  Location:  SURGERY CENTER;  Service: Orthopedics;  Laterality: Right;     Scheduled Meds:  amLODipine   10 mg Oral Daily   aspirin  EC  81 mg Oral Daily   enoxaparin  (LOVENOX ) injection  40 mg Subcutaneous Q24H   furosemide   20 mg Intravenous Once   insulin aspart  0-15 Units Subcutaneous TID WC   insulin aspart  0-5 Units Subcutaneous QHS   nicotine   14 mg Transdermal Daily   rosuvastatin   10 mg Oral Daily   sacubitril -valsartan   1 tablet Oral BID   spironolactone   12.5 mg Oral Daily   Continuous Infusions:  PRN Meds: acetaminophen , albuterol, benzonatate  Allergies:    Allergies  Allergen Reactions   Codeine Cough   Lisinopril Cough    Social History:   Social History   Socioeconomic History   Marital status: Divorced    Spouse name: Not on file   Number of children: 0   Years of education: Not on file   Highest education level: Not on file  Occupational History   Occupation: Retired     Comment: Counsellor at [employer]  Tobacco Use   Smoking status: Every Day    Current packs/day: 0.50    Average packs/day: 0.5 packs/day for 52.0 years (26.0 ttl pk-yrs)    Types: Cigarettes   Smokeless tobacco: Never   Tobacco comments:    0.5 PPD  Vaping Use   Vaping status: Never Used  Substance and Sexual Activity   Alcohol use: Not Currently    Comment: 2 beers/week   Drug use: Yes    Types: Marijuana   Sexual activity: Not on file  Other Topics Concern   Not on file  Social History Narrative   Current smoker-  0.5 ppd x ~ 45 yrs   Alcohol use- ~2 beers/day; 6 pk beer over a weekend   Divorced, married 17 yrs- no children      Current Social History 11/15/2020        Patient lives alone in a one level home with 4 outside steps with handrails       Patient's method of transportation is personal vehicle.  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 Ducatte, BSN, RN-BC       Social Drivers of Health   Financial Resource Strain: Low Risk  (03/04/2023)   Overall Financial Resource Strain (CARDIA)    Difficulty of Paying Living Expenses: Not very hard  Food Insecurity: No Food Insecurity (04/22/2024)   Hunger Vital Sign    Worried About Running Out of Food in the Last Year: Never true    Ran Out of Food in the Last Year: Never true  Transportation Needs: No Transportation Needs (04/22/2024)   PRAPARE - Administrator, Civil Service (Medical): No    Lack of Transportation (Non-Medical): No  Physical Activity: Insufficiently Active (03/04/2023)   Exercise Vital Sign    Days of Exercise per Week: 2 days    Minutes  of Exercise per Session: 40 min  Stress: No Stress Concern Present (12/13/2021)   [employer] of Occupational Health - Occupational Stress Questionnaire    Feeling of Stress : Not at all  Social Connections: Unknown (04/22/2024)   Social Connection and Isolation Panel    Frequency of Communication with Friends and Family: Three times a week    Frequency of Social Gatherings with Friends and Family: Not on file    Attends Religious Services: Not on file    Active Member of Clubs or Organizations: Not on file    Attends Banker Meetings: Not on file    Marital Status: Not on file  Intimate Partner Violence: Unknown (04/22/2024)   Humiliation, Afraid, Rape, and Kick questionnaire    Fear of Current or Ex-Partner: No    Emotionally Abused: No    Physically Abused: Not on file    Sexually Abused: Not on file    Family History:   Family History  Problem Relation Age of Onset   Cancer Father        Lung   Parkinson's disease Father    Renal Disease Brother        On Dialysis   Heart failure Brother    Hypertension Brother    Hypertension Mother    Stroke Brother      ROS:  Please see the history of present illness.  All other ROS reviewed and negative.     Physical Exam/Data: Vitals:   04/22/24 1929 04/22/24 2323 04/23/24 0328 04/23/24 0900  BP: (!) 145/89 (!) 149/86 129/85 (!) 152/97  Pulse: 92 87  90  Resp:   18 17  Temp: 98.1 F (36.7 C) 98.7 F (37.1 C) (!) 97.5 F (36.4 C) 98 F (36.7 C)  TempSrc: Oral Oral Oral Oral  SpO2: 99% 96% 96% 98%  Weight:      Height:        Intake/Output Summary (Last 24 hours) at 04/23/2024 1137 Last data filed at 04/22/2024 1819 Gross per 24 hour  Intake 120 ml  Output --  Net 120 ml      04/22/2024    2:50 AM 06/09/2023  9:51 AM 03/04/2023    2:20 PM  Last 3 Weights  Weight (lbs) 165 lb 174 lb 11.2 oz 175 lb  Weight (kg) 74.844 kg 79.243 kg 79.379 kg     Body mass index is 27.46 kg/m.  General:   Well nourished, well developed, in no acute distress. Sitting upright in the bed  HEENT: normal Neck: no JVD Vascular: Radial pulses 2+ bilaterally Cardiac:  normal S1, S2; RRR; no murmur   Lungs:  Expiratory wheezing throughout. Normal WOB on room air   Abd: soft, nontender   Ext: no edema in BLE  Musculoskeletal:  No deformities, BUE and BLE strength normal and equal Skin: warm and dry  Neuro:  CNs 2-12 intact, no focal abnormalities noted Psych:  Normal affect   EKG:  The EKG was personally reviewed and demonstrates:  showed sinus tachycardia with heart rate 135 bpm, PVC present, LVH with repolarizaiton abnormality Telemetry:  Telemetry was personally reviewed and demonstrates:  NSR  Relevant CV Studies: Cardiac Studies & Procedures   ______________________________________________________________________________________________ CARDIAC CATHETERIZATION  CARDIAC CATHETERIZATION 03/02/2017  Conclusion Findings:  Ao = 103/53 (75) LV =  121/4 RA = 1 RV = 17/3 PA = 17/5 (10) PCW = 4 Fick cardiac output/index = 3.3/1.9 PVR = 1.9 WU SVR = 1772 FA sat = 93% PA sat = 58%, 58%  Assessment: 1. Normal coronary arteries 2. NICM 35% 3. Low filling pressures with moderately to severely depressed cardiac output  Plan/Discussion:  He has NICM likely due to hypertension. Volume status is low. Will hydrate post-cath. Cut carvedilol  back. Can likely go home later today on  Carvedilol  12.5 bid Entresto  24/26 bid (please give 30-day voucher) Spiro 12.5 daily  F/u HF Clinic 1 week.  Cherrie Sieving, MD 10:58 AM  Findings Coronary Findings Diagnostic  Dominance: Right  Left Main Vessel is angiographically normal.  Left Anterior Descending Vessel is angiographically normal.  Left Circumflex Vessel is angiographically normal.  Right Coronary Artery Vessel is angiographically normal.  Intervention  No interventions have been documented.      ECHOCARDIOGRAM  ECHOCARDIOGRAM COMPLETE 04/22/2024  Narrative ECHOCARDIOGRAM REPORT    Patient Name:   Devon Vega Date of Exam: 04/22/2024 Medical Rec #:  [id number]          Height:       65.0 in Accession #:    [id number]         Weight:       165.0 lb Date of Birth:  1955/10/16          BSA:          1.823 m Patient Age:    68 years           BP:           165/90 mmHg Patient Gender: M                  HR:           112 bpm. Exam Location:  Inpatient  Procedure: 2D Echo and Intracardiac Opacification Agent (Both Spectral and Color Flow Doppler were utilized during procedure).  Indications:    CHF  History:        Patient has prior history of Echocardiogram examinations. CHF.  Sonographer:    Norleen Amour Referring Phys: [phone] JULIE ANNE WILLIAMS  IMPRESSIONS   1. Left ventricular ejection fraction, by estimation, is 20 to 25%. The left ventricle has severely decreased function. The left ventricle demonstrates global hypokinesis. The  left ventricular internal cavity size was moderately dilated. Indeterminate diastolic filling due to E-A fusion. 2. Right ventricular systolic function is normal. The right ventricular size is normal. There is mildly elevated pulmonary artery systolic pressure. 3. Left atrial size was moderately dilated. 4. The mitral valve is normal in structure. Mild mitral valve regurgitation. No evidence of mitral stenosis. 5. The aortic valve is tricuspid. There is mild calcification of the aortic valve. Aortic valve regurgitation is mild. No aortic stenosis is present. 6. The inferior vena cava is normal in size with <50% respiratory variability, suggesting right atrial pressure of 8 mmHg.  Comparison(s): Changes from prior study are noted. LVEF now severely reduced.  Conclusion(s)/Recommendation(s): No left ventricular mural or apical thrombus/thrombi. Severely reduced LVEF. Findings communicated to ordering provider.  FINDINGS Left Ventricle:  Left ventricular ejection fraction, by estimation, is 20 to 25%. The left ventricle has severely decreased function. The left ventricle demonstrates global hypokinesis. Definity contrast agent was given IV to delineate the left ventricular endocardial borders. The left ventricular internal cavity size was moderately dilated. There is no left ventricular hypertrophy. Indeterminate diastolic filling due to E-A fusion.  Right Ventricle: The right ventricular size is normal. No increase in right ventricular wall thickness. Right ventricular systolic function is normal. There is mildly elevated pulmonary artery systolic pressure. The tricuspid regurgitant velocity is 2.66 m/s, and with an assumed right atrial pressure of 8 mmHg, the estimated right ventricular systolic pressure is 36.3 mmHg.  Left Atrium: Left atrial size was moderately dilated.  Right Atrium: Right atrial size was normal in size.  Pericardium: There is no evidence of pericardial effusion.  Mitral Valve: The mitral valve is normal in structure. Mild mitral valve regurgitation. No evidence of mitral valve stenosis.  Tricuspid Valve: The tricuspid valve is normal in structure. Tricuspid valve regurgitation is mild . No evidence of tricuspid stenosis.  Aortic Valve: The aortic valve is tricuspid. There is mild calcification of the aortic valve. Aortic valve regurgitation is mild. Aortic regurgitation PHT measures 577 msec. No aortic stenosis is present.  Pulmonic Valve: The pulmonic valve was grossly normal. Pulmonic valve regurgitation is mild. No evidence of pulmonic stenosis.  Aorta: The aortic root and ascending aorta are structurally normal, with no evidence of dilitation.  Venous: The inferior vena cava is normal in size with less than 50% respiratory variability, suggesting right atrial pressure of 8 mmHg.  IAS/Shunts: The atrial septum is grossly normal.   LEFT VENTRICLE PLAX 2D LVIDd:         6.00 cm       Diastology LVIDs:         5.30 cm      LV e' medial:    5.22 cm/s LV PW:         1.10 cm      LV E/e' medial:  19.9 LV IVS:        0.80 cm      LV e' lateral:   8.05 cm/s LVOT diam:     2.10 cm      LV E/e' lateral: 12.9 LV SV:         42 LV SV Index:   23 LVOT Area:     3.46 cm  LV Volumes (MOD) LV vol d, MOD A2C: 173.0 ml LV vol d, MOD A4C: 179.0 ml LV vol s, MOD A2C: 134.0 ml LV vol s, MOD A4C: 108.0 ml LV SV MOD A2C:     39.0 ml LV SV MOD A4C:  179.0 ml LV SV MOD BP:      45.9 ml  RIGHT VENTRICLE            IVC RV Basal diam:  3.00 cm    IVC diam: 1.40 cm RV S prime:     9.46 cm/s TAPSE (M-mode): 1.7 cm  LEFT ATRIUM             Index        RIGHT ATRIUM          Index LA diam:        4.00 cm 2.19 cm/m   RA Area:     8.86 cm LA Vol (A2C):   65.5 ml 35.93 ml/m  RA Volume:   18.30 ml 10.04 ml/m LA Vol (A4C):   59.9 ml 32.86 ml/m LA Biplane Vol: 66.0 ml 36.21 ml/m AORTIC VALVE             PULMONIC VALVE LVOT Vmax:   99.00 cm/s  PR End Diast Vel: 17.81 msec LVOT Vmean:  60.100 cm/s LVOT VTI:    0.121 m AI PHT:      577 msec  AORTA Ao Root diam: 3.00 cm Ao Asc diam:  2.70 cm  MITRAL VALVE                TRICUSPID VALVE MV Area (PHT): 9.14 cm     TR Peak grad:   28.3 mmHg MV Decel Time: 83 msec      TR Vmax:        266.00 cm/s MR Vena Contracta: 0.30 cm MV E velocity: 104.00 cm/s  SHUNTS Systemic VTI:  0.12 m Systemic Diam: 2.10 cm  Shelda Bruckner MD Electronically signed by Shelda Bruckner MD Signature Date/Time: 04/22/2024/5:18:23 PM    Final          ______________________________________________________________________________________________       Laboratory Data: High Sensitivity Troponin:   Recent Labs  Lab 04/22/24 0010 04/22/24 0215  TROPONINIHS 20* 27*     Chemistry Recent Labs  Lab 04/22/24 0533 04/23/24 0241 04/23/24 0242  NA 138 138 137  K 4.3 4.5 4.0  CL 105 105 103  CO2 22 22 23   GLUCOSE 153* 97 97   BUN 11 16 15   CREATININE 1.26* 1.20 1.14  CALCIUM  8.9 8.9 8.9  GFRNONAA >60 >60 >60  ANIONGAP 11 11 11     Recent Labs  Lab 04/22/24 0010 04/23/24 0242  PROT 7.3  --   ALBUMIN 3.8 3.3*  AST 41  --   ALT 31  --   ALKPHOS 90  --   BILITOT 0.7  --    Lipids No results for input(s): CHOL, TRIG, HDL, LABVLDL, LDLCALC, CHOLHDL in the last 168 hours.  Hematology Recent Labs  Lab 04/22/24 0010 04/22/24 0052 04/22/24 0533 04/23/24 0241  WBC 8.9  --  11.8* 12.8*  RBC 5.61  --  5.11 4.84  HGB 16.1 17.3* 14.8 14.0  HCT 51.4 51.0 47.1 43.0  MCV 91.6  --  92.2 88.8  MCH 28.7  --  29.0 28.9  MCHC 31.3  --  31.4 32.6  RDW 14.9  --  15.0 15.0  PLT 273  --  239 198   Thyroid  No results for input(s): TSH, FREET4 in the last 168 hours.  BNP Recent Labs  Lab 04/22/24 0010  BNP 1,365.8*    DDimer No results for input(s): DDIMER in the last 168 hours.  Radiology/Studies:  ECHOCARDIOGRAM COMPLETE Result Date: 04/22/2024  ECHOCARDIOGRAM REPORT   Patient Name:   Devon Vega Date of Exam: 04/22/2024 Medical Rec #:  [id number]          Height:       65.0 in Accession #:    [id number]         Weight:       165.0 lb Date of Birth:  09/06/1955          BSA:          1.823 m Patient Age:    68 years           BP:           165/90 mmHg Patient Gender: M                  HR:           112 bpm. Exam Location:  Inpatient Procedure: 2D Echo and Intracardiac Opacification Agent (Both Spectral and Color            Flow Doppler were utilized during procedure). Indications:    CHF  History:        Patient has prior history of Echocardiogram examinations. CHF.  Sonographer:    Norleen Amour Referring Phys: [phone] JULIE ANNE WILLIAMS IMPRESSIONS  1. Left ventricular ejection fraction, by estimation, is 20 to 25%. The left ventricle has severely decreased function. The left ventricle demonstrates global hypokinesis. The left ventricular internal cavity size was moderately dilated.  Indeterminate diastolic filling due to E-A fusion.  2. Right ventricular systolic function is normal. The right ventricular size is normal. There is mildly elevated pulmonary artery systolic pressure.  3. Left atrial size was moderately dilated.  4. The mitral valve is normal in structure. Mild mitral valve regurgitation. No evidence of mitral stenosis.  5. The aortic valve is tricuspid. There is mild calcification of the aortic valve. Aortic valve regurgitation is mild. No aortic stenosis is present.  6. The inferior vena cava is normal in size with <50% respiratory variability, suggesting right atrial pressure of 8 mmHg. Comparison(s): Changes from prior study are noted. LVEF now severely reduced. Conclusion(s)/Recommendation(s): No left ventricular mural or apical thrombus/thrombi. Severely reduced LVEF. Findings communicated to ordering provider. FINDINGS  Left Ventricle: Left ventricular ejection fraction, by estimation, is 20 to 25%. The left ventricle has severely decreased function. The left ventricle demonstrates global hypokinesis. Definity contrast agent was given IV to delineate the left ventricular endocardial borders. The left ventricular internal cavity size was moderately dilated. There is no left ventricular hypertrophy. Indeterminate diastolic filling due to E-A fusion. Right Ventricle: The right ventricular size is normal. No increase in right ventricular wall thickness. Right ventricular systolic function is normal. There is mildly elevated pulmonary artery systolic pressure. The tricuspid regurgitant velocity is 2.66  m/s, and with an assumed right atrial pressure of 8 mmHg, the estimated right ventricular systolic pressure is 36.3 mmHg. Left Atrium: Left atrial size was moderately dilated. Right Atrium: Right atrial size was normal in size. Pericardium: There is no evidence of pericardial effusion. Mitral Valve: The mitral valve is normal in structure. Mild mitral valve regurgitation. No  evidence of mitral valve stenosis. Tricuspid Valve: The tricuspid valve is normal in structure. Tricuspid valve regurgitation is mild . No evidence of tricuspid stenosis. Aortic Valve: The aortic valve is tricuspid. There is mild calcification of the aortic valve. Aortic valve regurgitation is mild. Aortic regurgitation PHT measures 577 msec. No aortic stenosis is present. Pulmonic Valve: The pulmonic valve  was grossly normal. Pulmonic valve regurgitation is mild. No evidence of pulmonic stenosis. Aorta: The aortic root and ascending aorta are structurally normal, with no evidence of dilitation. Venous: The inferior vena cava is normal in size with less than 50% respiratory variability, suggesting right atrial pressure of 8 mmHg. IAS/Shunts: The atrial septum is grossly normal.  LEFT VENTRICLE PLAX 2D LVIDd:         6.00 cm      Diastology LVIDs:         5.30 cm      LV e' medial:    5.22 cm/s LV PW:         1.10 cm      LV E/e' medial:  19.9 LV IVS:        0.80 cm      LV e' lateral:   8.05 cm/s LVOT diam:     2.10 cm      LV E/e' lateral: 12.9 LV SV:         42 LV SV Index:   23 LVOT Area:     3.46 cm  LV Volumes (MOD) LV vol d, MOD A2C: 173.0 ml LV vol d, MOD A4C: 179.0 ml LV vol s, MOD A2C: 134.0 ml LV vol s, MOD A4C: 108.0 ml LV SV MOD A2C:     39.0 ml LV SV MOD A4C:     179.0 ml LV SV MOD BP:      45.9 ml RIGHT VENTRICLE            IVC RV Basal diam:  3.00 cm    IVC diam: 1.40 cm RV S prime:     9.46 cm/s TAPSE (M-mode): 1.7 cm LEFT ATRIUM             Index        RIGHT ATRIUM          Index LA diam:        4.00 cm 2.19 cm/m   RA Area:     8.86 cm LA Vol (A2C):   65.5 ml 35.93 ml/m  RA Volume:   18.30 ml 10.04 ml/m LA Vol (A4C):   59.9 ml 32.86 ml/m LA Biplane Vol: 66.0 ml 36.21 ml/m  AORTIC VALVE             PULMONIC VALVE LVOT Vmax:   99.00 cm/s  PR End Diast Vel: 17.81 msec LVOT Vmean:  60.100 cm/s LVOT VTI:    0.121 m AI PHT:      577 msec  AORTA Ao Root diam: 3.00 cm Ao Asc diam:  2.70 cm MITRAL  VALVE                TRICUSPID VALVE MV Area (PHT): 9.14 cm     TR Peak grad:   28.3 mmHg MV Decel Time: 83 msec      TR Vmax:        266.00 cm/s MR Vena Contracta: 0.30 cm MV E velocity: 104.00 cm/s  SHUNTS                             Systemic VTI:  0.12 m                             Systemic Diam: 2.10 cm Shelda Bruckner MD Electronically signed by Shelda Bruckner MD Signature Date/Time: 04/22/2024/5:18:23 PM    Final  DG Chest Port 1 View Result Date: 04/22/2024 CLINICAL DATA:  sob EXAM: PORTABLE CHEST 1 VIEW COMPARISON:  Chest x-ray 03/18/2018 FINDINGS: The heart and mediastinal contours are unchanged. No focal consolidation. Pulmonary edema. No pleural effusion. No pneumothorax. No acute osseous abnormality. IMPRESSION: Cardiomegaly with pulmonary edema. Electronically Signed   By: Morgane  Naveau M.D.   On: 04/22/2024 00:28     Assessment and Plan:  Acute HFrEF  Hypertensive emergency - Patient previously found to have EF of 30-35% in 2018.  At that time showed normal coronary arteries.  Suspected his cardiomyopathy was due to uncontrolled blood pressure.  EF improved to 60-65% in 03/2019 - Patient tells me that he stopped taking his medications about 6 months ago.  Presented to the ED 10/10 with shortness of breath.  BP significantly elevated to 218/102 - Echo this admission showed EF 20-25%, normal RV systolic function, mild MR, mild AI. - Patient denies chest pain.  He has continued to smoke cigarettes.   - Consider repeat right/left heart catheterization given reduced the EF.  Will discuss with MD.  Very possible that drop in EF is again due to uncontrolled hypertension as patient has been off his medications for several months. Could also consider outpatient ischemic evaluation  - Patient appears euvolemic on exam today - Continue Entresto  24-26 mg twice daily, amlodipine  10 mg daily, spironolactone  12.5 mg daily  - Start jardiance 10 mg daily  - No evidence of shock and  BP continues to be elevated. Start metoprolol succinate 25 mg daily. May need carvedilol  for BP control, but he has quite a bit of wheezing on exam today   Otherwise per primary  - Prediabetes   Risk Assessment/Risk Scores:   For questions or updates, please contact [facility] Please consult [email or web address] for contact info under   Signed, Rollo FABIENE Louder, PA-C  04/23/2024 11:37 AM

## 2024-04-24 ENCOUNTER — Other Ambulatory Visit (HOSPITAL_COMMUNITY): Payer: Self-pay

## 2024-04-24 DIAGNOSIS — I161 Hypertensive emergency: Secondary | ICD-10-CM | POA: Diagnosis not present

## 2024-04-24 DIAGNOSIS — E1169 Type 2 diabetes mellitus with other specified complication: Secondary | ICD-10-CM

## 2024-04-24 DIAGNOSIS — Z7984 Long term (current) use of oral hypoglycemic drugs: Secondary | ICD-10-CM

## 2024-04-24 DIAGNOSIS — E782 Mixed hyperlipidemia: Secondary | ICD-10-CM | POA: Diagnosis not present

## 2024-04-24 DIAGNOSIS — N179 Acute kidney failure, unspecified: Secondary | ICD-10-CM | POA: Diagnosis not present

## 2024-04-24 DIAGNOSIS — J810 Acute pulmonary edema: Secondary | ICD-10-CM | POA: Diagnosis not present

## 2024-04-24 DIAGNOSIS — E119 Type 2 diabetes mellitus without complications: Secondary | ICD-10-CM

## 2024-04-24 DIAGNOSIS — I5030 Unspecified diastolic (congestive) heart failure: Secondary | ICD-10-CM | POA: Diagnosis not present

## 2024-04-24 DIAGNOSIS — I5021 Acute systolic (congestive) heart failure: Secondary | ICD-10-CM | POA: Diagnosis not present

## 2024-04-24 DIAGNOSIS — Z7982 Long term (current) use of aspirin: Secondary | ICD-10-CM

## 2024-04-24 LAB — CBC
HCT: 53.6 % — ABNORMAL HIGH (ref 39.0–52.0)
Hemoglobin: 17.3 g/dL — ABNORMAL HIGH (ref 13.0–17.0)
MCH: 28.6 pg (ref 26.0–34.0)
MCHC: 32.3 g/dL (ref 30.0–36.0)
MCV: 88.7 fL (ref 80.0–100.0)
Platelets: 314 K/uL (ref 150–400)
RBC: 6.04 MIL/uL — ABNORMAL HIGH (ref 4.22–5.81)
RDW: 14.6 % (ref 11.5–15.5)
WBC: 9.4 K/uL (ref 4.0–10.5)
nRBC: 0 % (ref 0.0–0.2)

## 2024-04-24 LAB — BASIC METABOLIC PANEL WITH GFR
Anion gap: 12 (ref 5–15)
BUN: 15 mg/dL (ref 8–23)
CO2: 24 mmol/L (ref 22–32)
Calcium: 9.3 mg/dL (ref 8.9–10.3)
Chloride: 101 mmol/L (ref 98–111)
Creatinine, Ser: 1.26 mg/dL — ABNORMAL HIGH (ref 0.61–1.24)
GFR, Estimated: >60 mL/min (ref >60)
Glucose, Bld: 106 mg/dL — ABNORMAL HIGH (ref 70–99)
Potassium: 4 mmol/L (ref 3.5–5.1)
Sodium: 137 mmol/L (ref 135–145)

## 2024-04-24 LAB — MAGNESIUM: Magnesium: 2.5 mg/dL — ABNORMAL HIGH (ref 1.7–2.4)

## 2024-04-24 LAB — LIPID PANEL
Cholesterol: 189 mg/dL (ref 0–200)
HDL: 43 mg/dL (ref >40)
LDL Cholesterol: 120 mg/dL — ABNORMAL HIGH (ref 0–99)
Total CHOL/HDL Ratio: 4.4 ratio
Triglycerides: 130 mg/dL (ref <150)
VLDL: 26 mg/dL (ref 0–40)

## 2024-04-24 LAB — GLUCOSE, CAPILLARY: Glucose-Capillary: 140 mg/dL — ABNORMAL HIGH (ref 70–99)

## 2024-04-24 MED ORDER — ALBUTEROL SULFATE HFA 108 (90 BASE) MCG/ACT IN AERS: 2.0000 | INHALATION_SPRAY | Freq: Four times a day (QID) | RESPIRATORY_TRACT | 0 refills | Status: AC | PRN

## 2024-04-24 MED ORDER — SACUBITRIL-VALSARTAN 24-26 MG PO TABS
1.0000 | ORAL_TABLET | Freq: Two times a day (BID) | ORAL | 0 refills | Status: DC
  Filled 2024-04-24: qty 60, 30d supply, fill #0

## 2024-04-24 MED ORDER — ASPIRIN 81 MG PO TBEC: 81.0000 mg | DELAYED_RELEASE_TABLET | Freq: Every day | ORAL | 0 refills | Status: DC

## 2024-04-24 MED ORDER — SACUBITRIL-VALSARTAN 24-26 MG PO TABS: 1.0000 | ORAL_TABLET | Freq: Two times a day (BID) | ORAL | 0 refills | Status: DC

## 2024-04-24 MED ORDER — ROSUVASTATIN CALCIUM 20 MG PO TABS: 20.0000 mg | ORAL_TABLET | Freq: Every day | ORAL | 0 refills | Status: DC

## 2024-04-24 MED ORDER — AMLODIPINE BESYLATE 10 MG PO TABS
10.0000 mg | ORAL_TABLET | Freq: Every day | ORAL | 0 refills | Status: DC
  Filled 2024-04-24: qty 30, 30d supply, fill #0

## 2024-04-24 MED ORDER — NICOTINE 14 MG/24HR TD PT24
14.0000 mg | MEDICATED_PATCH | Freq: Every day | TRANSDERMAL | 0 refills | Status: DC
  Filled 2024-04-24: qty 28, 28d supply, fill #0

## 2024-04-24 MED ORDER — METOPROLOL SUCCINATE ER 25 MG PO TB24
25.0000 mg | ORAL_TABLET | Freq: Every day | ORAL | 0 refills | Status: DC
  Filled 2024-04-24: qty 30, 30d supply, fill #0

## 2024-04-24 MED ORDER — EMPAGLIFLOZIN 10 MG PO TABS: 10.0000 mg | ORAL_TABLET | Freq: Every day | ORAL | 0 refills | Status: DC

## 2024-04-24 MED ORDER — EMPAGLIFLOZIN 10 MG PO TABS
10.0000 mg | ORAL_TABLET | Freq: Every day | ORAL | 0 refills | Status: DC
  Filled 2024-04-24: qty 30, 30d supply, fill #0

## 2024-04-24 MED ORDER — ROSUVASTATIN CALCIUM 20 MG PO TABS
20.0000 mg | ORAL_TABLET | Freq: Every day | ORAL | 0 refills | Status: DC
  Filled 2024-04-24: qty 30, 30d supply, fill #0

## 2024-04-24 MED ORDER — SPIRONOLACTONE 25 MG PO TABS: 12.5000 mg | ORAL_TABLET | Freq: Every day | ORAL | 0 refills | Status: DC

## 2024-04-24 MED ORDER — METOPROLOL SUCCINATE ER 25 MG PO TB24: 25.0000 mg | ORAL_TABLET | Freq: Every day | ORAL | 0 refills | Status: DC

## 2024-04-24 MED ORDER — ASPIRIN 81 MG PO TBEC
81.0000 mg | DELAYED_RELEASE_TABLET | Freq: Every day | ORAL | 0 refills | Status: DC
  Filled 2024-04-24: qty 30, 30d supply, fill #0

## 2024-04-24 MED ORDER — SPIRONOLACTONE 25 MG PO TABS
12.5000 mg | ORAL_TABLET | Freq: Every day | ORAL | 0 refills | Status: DC
  Filled 2024-04-24: qty 30, 60d supply, fill #0

## 2024-04-24 MED ORDER — NICOTINE 14 MG/24HR TD PT24: 14.0000 mg | MEDICATED_PATCH | Freq: Every day | TRANSDERMAL | 0 refills | Status: DC

## 2024-04-24 MED ORDER — AMLODIPINE BESYLATE 10 MG PO TABS: 10.0000 mg | ORAL_TABLET | Freq: Every day | ORAL | 0 refills | Status: DC

## 2024-04-24 NOTE — Discharge Instructions (Signed)
 You came to the hospital for shortness of breath.  We treated you with medications and restarted your blood pressure medications.  Please start taking the following medications:  - Amlodipine  10 mg, take 1 tablet by mouth daily - Aspirin  81 mg, take 1 tablet by mouth daily - Jardiance 10 mg, take 1 tablet by mouth daily - Metoprolol succinate 25 mg, take 1 tablet by mouth daily - Crestor  20 mg, take 1 tablet by mouth daily - Entresto  24/26 mg, take 1 tab by mouth 2 times daily - Spironolactone  25 mg, take half a tablet by mouth daily   Please make sure you schedule an appointment with the heart doctor.  If you have any of these following symptoms, please call us  or seek care at an emergency department: -Chest Pain -Difficulty Breathing -Worsening abdominal pain -Syncope (passing out) -Drooping of face -Slurred speech -Sudden weakness in your leg or arm -Fever -Chills -blood in the stool -dark black, sticky stool  If you have any questions or concerns, call our clinic at [phone], [phone] or after hours call [phone] and ask for the internal medicine resident on call.  I am glad you are feeling better. It was a pleasure taking care for you. I wish a good recovery and good health!   Dr. Toma Edwards

## 2024-04-24 NOTE — Care Management (Signed)
 Spoke w patient and visitor at bedside. Provided with 30 day coupons for Entresto  and Jardiance.

## 2024-04-24 NOTE — Discharge Summary (Signed)
 Name: Devon Vega MRN: [id number] DOB: 26-Jul-1955 68 y.o. PCP: Karna Fellows, MD  Date of Admission: 04/22/2024 12:04 AM Date of Discharge:  04/24/2024 Attending Physician: Dr. Eben  DISCHARGE DIAGNOSIS:  Primary Problem: Hypertensive emergency   Hospital Problems: Principal Problem:   Hypertensive emergency Active Problems:   Current smoker   Essential hypertension   NICM (nonischemic cardiomyopathy) (HCC)   Prediabetes   Flash pulmonary edema (HCC)    DISCHARGE MEDICATIONS:   Allergies as of 04/24/2024       Reactions   Codeine Cough   Lisinopril Cough        Medication List     TAKE these medications    albuterol 108 (90 Base) MCG/ACT inhaler Commonly known as: VENTOLIN HFA Inhale 2 puffs into the lungs every 6 (six) hours as needed for wheezing or shortness of breath.   amLODipine  10 MG tablet Commonly known as: NORVASC  Take 1 tablet (10 mg total) by mouth daily.   aspirin  EC 81 MG tablet Take 1 tablet (81 mg total) by mouth daily. Swallow whole.   empagliflozin 10 MG Tabs tablet Commonly known as: JARDIANCE Take 1 tablet (10 mg total) by mouth daily.   metoprolol succinate 25 MG 24 hr tablet Commonly known as: TOPROL-XL Take 1 tablet (25 mg total) by mouth daily.   nicotine  14 mg/24hr patch Commonly known as: NICODERM CQ  - dosed in mg/24 hours Place 1 patch (14 mg total) onto the skin daily.   rosuvastatin  20 MG tablet Commonly known as: Crestor  Take 1 tablet (20 mg total) by mouth daily.   sacubitril -valsartan  24-26 MG Commonly known as: ENTRESTO  Take 1 tablet by mouth 2 (two) times daily.   spironolactone  25 MG tablet Commonly known as: ALDACTONE  Take 0.5 tablets (12.5 mg total) by mouth daily.        DISPOSITION AND FOLLOW-UP:  [name] was discharged from [facility] in Good condition. At the hospital follow up visit please address:  Acute HFrEF w/ hx of NICM:  - Discharged on GDMT  medications. Please Titrate GDMT and stop amlodipine  in the outpatient setting. Consider Titrating Entresto  first.   Primary hypertension presenting with hypertensive emergency in the setting of medication noncompliance:  - Ensure adherence to medications.  Mixed hyperlipidemia: LDL not at goal: Discharged on Crestor  20 mg daily.  Ensure tobacco cessation  Ensure alcohol cessation.   Follow-up Recommendations: Consults: None Labs: Basic Metabolic Profile and CBC Studies: F/u ECHO in 3-6 months  Medications:  New Medications:  - Amlodipine  10 mg, take 1 tablet by mouth daily - Aspirin  81 mg, take 1 tablet by mouth daily - Jardiance 10 mg, take 1 tablet by mouth daily - Metoprolol succinate 25 mg, take 1 tablet by mouth daily - Crestor  20 mg, take 1 tablet by mouth daily - Entresto  24/26 mg, take 1 tab by mouth 2 times daily - Spironolactone  25 mg, take half a tablet by mouth daily   Follow-up Appointments:  Follow-up Information     D'Mello, Rosalyn, DO Follow up on 04/29/2024.   Specialty: Internal Medicine Why: 10:15 AM Contact information: [address] [place] [address] [phone]                 HOSPITAL COURSE:  Patient Summary:  Flash pulmonary edema 2/2 medication nonadherence and hypertensive emergency Medication non-adherence HFrEF (EF 20 to 25%) Presented to the ED with shortness of breath and without chest pain.  Stated that he  stopped taking his blood pressure medications 6 months ago.  He received 2 g Mg, 125 mg Solu-Medrol , and CPAP by EMS.  In the ED he received albuterol and nitroglycerin  50 mg infusion.  Blood pressure elevated with systolics >210 and diastolic >100.  Chest x-ray showed cardiomegaly with pulmonary edema.  Flash pulmonary edema likely secondary to medication nonadherence and hypertensive emergency.  When meeting this patient for admission, his symptoms had completely resolved.  He completed nitroglycerin   infusion taper and was started on amlodipine  10 mg. On physical exam cackles present on bilateral bases.  Echo ordered and showed EF 20 to 25%, LV severely decreased function.  LV demonstrates global hypokinesis.  LV moderately dilated.  RV systolic function normal.  Mildly elevated pulmonary artery systolic pressure.  Mild calcification of aortic valve.  Aortic valve regurg mild.  Had previous EF of 30 to 35% in 2018 with normal coronary arteries. Cardiology was consulted and patient was started Lasix  20 mg IV one-time dose, Entresto  24-26 mg twice daily, amlodipine  10 mg daily, metoprolol succinate 25 mg daily, spironolactone  12.5 mg daily, Jardiance 10 mg daily. Will need to optimize GDMT outpatient setting.   AKI Creatinine on admission 1.5, decreased to 1.26.  Baseline appears around 1.0.  Likely prerenal 2/2 uncontrolled blood pressure.  Creatinine on discharge 1.26.    COPD Tobacco use disorder 27-year, 10 cigarettes/day smoker.  Counseled on smoking cessation.  Patient understood he needed to quit and wanted to be started on nicotine  patches.  Nicotine  patches ordered during hospitalization and will discharge with nicotine  patches.    Type 2 diabetes A1c 5.9 on admission.  Denied any episodes of hypoglycemia prior to admission.  Received SSI.  Started on SGLT2i during hospitalization.    HLD LDL 120, PTA Crestor  10, now Crestor  20   DISCHARGE INSTRUCTIONS:   Discharge Instructions     Diet - low sodium heart healthy   Complete by: As directed    Discharge instructions   Complete by: As directed    You came to the hospital for shortness of breath.  We treated you with medications and restarted your blood pressure medications.  Please start taking the following medications:  - Amlodipine  10 mg, take 1 tablet by mouth daily - Aspirin  81 mg, take 1 tablet by mouth daily - Jardiance 10 mg, take 1 tablet by mouth daily - Metoprolol succinate 25 mg, take 1 tablet by mouth daily -  Crestor  20 mg, take 1 tablet by mouth daily - Entresto  24/26 mg, take 1 tab by mouth 2 times daily - Spironolactone  25 mg, take half a tablet by mouth daily   Please make sure you schedule an appointment with the heart doctor.  If you have any of these following symptoms, please call us  or seek care at an emergency department: -Chest Pain -Difficulty Breathing -Worsening abdominal pain -Syncope (passing out) -Drooping of face -Slurred speech -Sudden weakness in your leg or arm -Fever -Chills -blood in the stool -dark black, sticky stool  If you have any questions or concerns, call our clinic at [phone], [phone] or after hours call [phone] and ask for the internal medicine resident on call.  I am glad you are feeling better. It was a pleasure taking care for you. I wish a good recovery and good health!   Dr. Toma Edwards   Increase activity slowly   Complete by: As directed        SUBJECTIVE:  Patient is evaluated bedside.  He denied SOB,  CP, H/A or any new concern today, he was able to walk without any problem and also had normal urination and bowl movement.    Discharge Vitals:   BP (!) 144/90 (BP Location: Left Arm)   Pulse 93   Temp 97.7 F (36.5 C) (Oral)   Resp 20   Ht 5' 5 (1.651 m)   Wt 74.8 kg   SpO2 96%   BMI 27.46 kg/m   OBJECTIVE:  Physical Exam  General: Laying in bed, no apparent distress Cardiovascular: Regular rate, no murmurs appreciated Pulmonary: Breathing comfortably, no wheezing or crackles Abdomen: Soft, nontender, nondistended MSK: No lower extremity edema bilaterally  Pertinent Labs, Studies, and Procedures:     Latest Ref Rng & Units 04/24/2024    2:09 AM 04/23/2024    2:41 AM 04/22/2024    5:33 AM  CBC  WBC 4.0 - 10.5 K/uL 9.4  12.8  11.8   Hemoglobin 13.0 - 17.0 g/dL 82.6  85.9  85.1   Hematocrit 39.0 - 52.0 % 53.6  43.0  47.1   Platelets 150 - 400 K/uL 314  198  239        Latest Ref Rng & Units  04/24/2024    2:09 AM 04/23/2024    2:42 AM 04/23/2024    2:41 AM  CMP  Glucose 70 - 99 mg/dL 893  97  97   BUN 8 - 23 mg/dL 15  15  16    Creatinine 0.61 - 1.24 mg/dL 8.73  8.85  8.79   Sodium 135 - 145 mmol/L 137  137  138   Potassium 3.5 - 5.1 mmol/L 4.0  4.0  4.5   Chloride 98 - 111 mmol/L 101  103  105   CO2 22 - 32 mmol/L 24  23  22    Calcium  8.9 - 10.3 mg/dL 9.3  8.9  8.9     ECHOCARDIOGRAM COMPLETE Result Date: 04/22/2024    ECHOCARDIOGRAM REPORT   Patient Name:   MARQUI FORMBY Date of Exam: 04/22/2024 Medical Rec #:  [id number]          Height:       65.0 in Accession #:    [id number]         Weight:       165.0 lb Date of Birth:  05/11/1956          BSA:          1.823 m Patient Age:    68 years           BP:           165/90 mmHg Patient Gender: M                  HR:           112 bpm. Exam Location:  Inpatient Procedure: 2D Echo and Intracardiac Opacification Agent (Both Spectral and Color            Flow Doppler were utilized during procedure). Indications:    CHF  History:        Patient has prior history of Echocardiogram examinations. CHF.  Sonographer:    Norleen Amour Referring Phys: [phone] JULIE ANNE WILLIAMS IMPRESSIONS  1. Left ventricular ejection fraction, by estimation, is 20 to 25%. The left ventricle has severely decreased function. The left ventricle demonstrates global hypokinesis. The left ventricular internal cavity size was moderately dilated. Indeterminate diastolic filling due to E-A fusion.  2. Right ventricular systolic function is normal.  The right ventricular size is normal. There is mildly elevated pulmonary artery systolic pressure.  3. Left atrial size was moderately dilated.  4. The mitral valve is normal in structure. Mild mitral valve regurgitation. No evidence of mitral stenosis.  5. The aortic valve is tricuspid. There is mild calcification of the aortic valve. Aortic valve regurgitation is mild. No aortic stenosis is present.  6. The inferior vena cava  is normal in size with <50% respiratory variability, suggesting right atrial pressure of 8 mmHg. Comparison(s): Changes from prior study are noted. LVEF now severely reduced. Conclusion(s)/Recommendation(s): No left ventricular mural or apical thrombus/thrombi. Severely reduced LVEF. Findings communicated to ordering provider. FINDINGS  Left Ventricle: Left ventricular ejection fraction, by estimation, is 20 to 25%. The left ventricle has severely decreased function. The left ventricle demonstrates global hypokinesis. Definity contrast agent was given IV to delineate the left ventricular endocardial borders. The left ventricular internal cavity size was moderately dilated. There is no left ventricular hypertrophy. Indeterminate diastolic filling due to E-A fusion. Right Ventricle: The right ventricular size is normal. No increase in right ventricular wall thickness. Right ventricular systolic function is normal. There is mildly elevated pulmonary artery systolic pressure. The tricuspid regurgitant velocity is 2.66  m/s, and with an assumed right atrial pressure of 8 mmHg, the estimated right ventricular systolic pressure is 36.3 mmHg. Left Atrium: Left atrial size was moderately dilated. Right Atrium: Right atrial size was normal in size. Pericardium: There is no evidence of pericardial effusion. Mitral Valve: The mitral valve is normal in structure. Mild mitral valve regurgitation. No evidence of mitral valve stenosis. Tricuspid Valve: The tricuspid valve is normal in structure. Tricuspid valve regurgitation is mild . No evidence of tricuspid stenosis. Aortic Valve: The aortic valve is tricuspid. There is mild calcification of the aortic valve. Aortic valve regurgitation is mild. Aortic regurgitation PHT measures 577 msec. No aortic stenosis is present. Pulmonic Valve: The pulmonic valve was grossly normal. Pulmonic valve regurgitation is mild. No evidence of pulmonic stenosis. Aorta: The aortic root and ascending  aorta are structurally normal, with no evidence of dilitation. Venous: The inferior vena cava is normal in size with less than 50% respiratory variability, suggesting right atrial pressure of 8 mmHg. IAS/Shunts: The atrial septum is grossly normal.  LEFT VENTRICLE PLAX 2D LVIDd:         6.00 cm      Diastology LVIDs:         5.30 cm      LV e' medial:    5.22 cm/s LV PW:         1.10 cm      LV E/e' medial:  19.9 LV IVS:        0.80 cm      LV e' lateral:   8.05 cm/s LVOT diam:     2.10 cm      LV E/e' lateral: 12.9 LV SV:         42 LV SV Index:   23 LVOT Area:     3.46 cm  LV Volumes (MOD) LV vol d, MOD A2C: 173.0 ml LV vol d, MOD A4C: 179.0 ml LV vol s, MOD A2C: 134.0 ml LV vol s, MOD A4C: 108.0 ml LV SV MOD A2C:     39.0 ml LV SV MOD A4C:     179.0 ml LV SV MOD BP:      45.9 ml RIGHT VENTRICLE            IVC  RV Basal diam:  3.00 cm    IVC diam: 1.40 cm RV S prime:     9.46 cm/s TAPSE (M-mode): 1.7 cm LEFT ATRIUM             Index        RIGHT ATRIUM          Index LA diam:        4.00 cm 2.19 cm/m   RA Area:     8.86 cm LA Vol (A2C):   65.5 ml 35.93 ml/m  RA Volume:   18.30 ml 10.04 ml/m LA Vol (A4C):   59.9 ml 32.86 ml/m LA Biplane Vol: 66.0 ml 36.21 ml/m  AORTIC VALVE             PULMONIC VALVE LVOT Vmax:   99.00 cm/s  PR End Diast Vel: 17.81 msec LVOT Vmean:  60.100 cm/s LVOT VTI:    0.121 m AI PHT:      577 msec  AORTA Ao Root diam: 3.00 cm Ao Asc diam:  2.70 cm MITRAL VALVE                TRICUSPID VALVE MV Area (PHT): 9.14 cm     TR Peak grad:   28.3 mmHg MV Decel Time: 83 msec      TR Vmax:        266.00 cm/s MR Vena Contracta: 0.30 cm MV E velocity: 104.00 cm/s  SHUNTS                             Systemic VTI:  0.12 m                             Systemic Diam: 2.10 cm Shelda Bruckner MD Electronically signed by Shelda Bruckner MD Signature Date/Time: 04/22/2024/5:18:23 PM    Final    DG Chest Port 1 View Result Date: 04/22/2024 CLINICAL DATA:  sob EXAM: PORTABLE CHEST 1 VIEW  COMPARISON:  Chest x-ray 03/18/2018 FINDINGS: The heart and mediastinal contours are unchanged. No focal consolidation. Pulmonary edema. No pleural effusion. No pneumothorax. No acute osseous abnormality. IMPRESSION: Cardiomegaly with pulmonary edema. Electronically Signed   By: Morgane  Naveau M.D.   On: 04/22/2024 00:28     Signed: Toma Edwards, D.O.  Internal Medicine Resident, PGY-2 [facility]  Pager: [phone] 10:01 AM, 04/24/2024

## 2024-04-24 NOTE — Progress Notes (Signed)
 Progress Note  Patient Name: Devon Vega Date of Encounter: 04/24/2024  Primary Cardiologist:  Dr. Bensimhon (2020)  Interval Summary  Chart reviewed.  Patient states that he feels better, no shortness of breath or chest pain.  Net urine output approximate 2900 cc last 24 hours.  Creatinine up to 1.26.  Vital Signs  Vitals:   04/23/24 2059 04/23/24 2100 04/23/24 2345 04/24/24 0711  BP: 126/87 126/87 119/83 (!) 144/90  Pulse: 92 91 89 93  Resp: 20  20 20   Temp: 98.6 F (37 C)  98.6 F (37 C) 97.7 F (36.5 C)  TempSrc: Oral  Oral Oral  SpO2: 98% 98% 98% 96%  Weight:      Height:        Intake/Output Summary (Last 24 hours) at 04/24/2024 0848 Last data filed at 04/23/2024 2345 Gross per 24 hour  Intake 480 ml  Output 3425 ml  Net -2945 ml   Filed Weights   04/22/24 0250  Weight: 74.8 kg    Physical Exam  GEN: No acute distress.   Neck: No JVD. Cardiac: Indistinct PMI, RRR without gallop.  Respiratory: Nonlabored. Clear to auscultation bilaterally. GI: Soft, nontender, bowel sounds present. MS: No edema. Neuro:  Nonfocal. Psych: Alert and oriented x 3. Normal affect.  ECG/Telemetry  Telemetry reviewed showing sinus rhythm.  Labs  Chemistry Recent Labs  Lab 04/22/24 0010 04/22/24 0052 04/23/24 0241 04/23/24 0242 04/24/24 0209  NA 138   < > 138 137 137  K 4.0   < > 4.5 4.0 4.0  CL 105   < > 105 103 101  CO2 18*   < > 22 23 24   GLUCOSE 243*   < > 97 97 106*  BUN 13   < > 16 15 15   CREATININE 1.50*   < > 1.20 1.14 1.26*  CALCIUM  8.9   < > 8.9 8.9 9.3  PROT 7.3  --   --   --   --   ALBUMIN 3.8  --   --  3.3*  --   AST 41  --   --   --   --   ALT 31  --   --   --   --   ALKPHOS 90  --   --   --   --   BILITOT 0.7  --   --   --   --   GFRNONAA 50*   < > >60 >60 >60  ANIONGAP 15   < > 11 11 12    < > = values in this interval not displayed.    Hematology Recent Labs  Lab 04/22/24 0533 04/23/24 0241 04/24/24 0209  WBC 11.8* 12.8*  9.4  RBC 5.11 4.84 6.04*  HGB 14.8 14.0 17.3*  HCT 47.1 43.0 53.6*  MCV 92.2 88.8 88.7  MCH 29.0 28.9 28.6  MCHC 31.4 32.6 32.3  RDW 15.0 15.0 14.6  PLT 239 198 314   Cardiac Enzymes Recent Labs  Lab 04/22/24 0010 04/22/24 0215  TROPONINIHS 20* 27*   Lipid Panel     Component Value Date/Time   CHOL 189 04/24/2024 0209   CHOL 128 06/09/2022 0946   TRIG 130 04/24/2024 0209   HDL 43 04/24/2024 0209   HDL 44 06/09/2022 0946   CHOLHDL 4.4 04/24/2024 0209   VLDL 26 04/24/2024 0209   LDLCALC 120 (H) 04/24/2024 0209   LDLCALC 56 06/09/2022 0946   LABVLDL 28 06/09/2022 0946    Cardiac Studies  Echocardiogram 04/22/2024:  1. Left ventricular ejection fraction, by estimation, is 20 to 25%. The  left ventricle has severely decreased function. The left ventricle  demonstrates global hypokinesis. The left ventricular internal cavity size  was moderately dilated. Indeterminate  diastolic filling due to E-A fusion.   2. Right ventricular systolic function is normal. The right ventricular  size is normal. There is mildly elevated pulmonary artery systolic  pressure.   3. Left atrial size was moderately dilated.   4. The mitral valve is normal in structure. Mild mitral valve  regurgitation. No evidence of mitral stenosis.   5. The aortic valve is tricuspid. There is mild calcification of the  aortic valve. Aortic valve regurgitation is mild. No aortic stenosis is  present.   6. The inferior vena cava is normal in size with <50% respiratory  variability, suggesting right atrial pressure of 8 mmHg.   Assessment & Plan  1.  Acute HFrEF with history of nonischemic cardiomyopathy.  LVEF 20 to 25% range with global hypokinesis and normal RV contraction.  Patient has had no regular cardiology follow-up since 2020 and off all medications for the last 6 months presenting with hypertensive emergency.  Cardiac catheterization 2018 showed normal coronaries and it was felt that his  cardiomyopathy was likely secondary to hypertension at that time.   2.  Primary hypertension presenting with hypertensive emergency in the setting of medication noncompliance.   3.  Prediabetes, hemoglobin A1c 5.9%.   4.  Mixed hyperlipidemia, LDL 56 in 2023 on Crestor .  LDL now up to 120 in the absence of medication.   5.  Tobacco abuse.  Smoking cessation indicated.   6.  Alcohol use, drinks 2-3 beers daily by report.  Clinically improved and appears euvolemic at this time.  Would have him ambulate and potentially consider discharge home today with close TOC follow-up in the heart failure clinic (saw Dr. Bensimhon in 2020).  Discharge regimen should include Norvasc  10 mg daily, Jardiance 10 mg daily, Toprol-XL 25 mg daily, Entresto  24/26 mg twice daily, Aldactone  12.5 mg daily, and Crestor  10 mg daily.  Can give Lasix  20 mg tablets to take as needed leg swelling or weight gain of 2 pounds in 24 hours.  For questions or updates, please contact [facility] Please consult [email or web address] for contact info under   Signed, Jayson Sierras, MD  04/24/2024, 8:48 AM

## 2024-04-25 ENCOUNTER — Other Ambulatory Visit (HOSPITAL_COMMUNITY): Payer: Self-pay

## 2024-04-25 ENCOUNTER — Telehealth: Payer: Self-pay

## 2024-04-25 ENCOUNTER — Telehealth: Payer: Self-pay | Admitting: *Deleted

## 2024-04-25 ENCOUNTER — Other Ambulatory Visit: Payer: Self-pay

## 2024-04-25 NOTE — Telephone Encounter (Signed)
  Call to patient given number to cal; the Financial Counselor to see if patient may qualify for Medicaid.   Will also send to [facility] to see if any of the Patient's medication have patient assistance.                                    Copied from CRM [id number]. Topic: Clinical - Prescription Issue >> Apr 25, 2024  9:01 AM Marda MATSU wrote: Reason for CRM: Patient went to pharmacy   [facility] [phone] - [place], [place] - [address] AT [facility] [address] [place] [address] Phone: [phone] Fax: [phone]  and was told his insurance was dropped and he would have to pay over a thousand dollars for the following medications:   albuterol (VENTOLIN HFA) 108 (90 Base) MCG/ACT inhaler amLODipine  (NORVASC ) 10 MG tablet aspirin  EC 81 MG tablet empagliflozin (JARDIANCE) 10 MG TABS tablet metoprolol succinate (TOPROL-XL) 25 MG 24 hr tablet nicotine  (NICODERM CQ  - DOSED IN MG/24 HOURS) 14 mg/24hr patch rosuvastatin  (CRESTOR ) 20 MG tablet sacubitril -valsartan  (ENTRESTO ) 24-26 MG spironolactone  (ALDACTONE ) 25 MG tablet  He states he can not afford these medication and would like to know what else he can do.

## 2024-04-25 NOTE — Transitions of Care (Post Inpatient/ED Visit) (Signed)
   04/25/2024  Name: Devon Vega MRN: [id number] DOB: [birth year]  Today's TOC FU Call Status: Today's TOC FU Call Status:: Successful TOC FU Call Completed TOC FU Call Complete Date: 04/25/24 Patient's Name and Date of Birth confirmed.  Transition Care Management Follow-up Telephone Call Date of Discharge: 04/24/24 Discharge Facility: [facility] Type of Discharge: Inpatient Admission Primary Inpatient Discharge Diagnosis:: pulmonary edema How have you been since you were released from the hospital?: Better Any questions or concerns?: No  Items Reviewed: Did you receive and understand the discharge instructions provided?: Yes Medications obtained,verified, and reconciled?: Yes (Medications Reviewed) Any new allergies since your discharge?: No Dietary orders reviewed?: Yes Do you have support at home?: No  Medications Reviewed Today: Medications Reviewed Today     Reviewed by Emmitt Pan, LPN (Licensed Practical Nurse) on 04/25/24 at [phone]  Med List Status: <None>   Medication Order Taking? Sig Documenting Provider Last Dose Status Informant  albuterol (VENTOLIN HFA) 108 (90 Base) MCG/ACT inhaler [id number] Yes Inhale 2 puffs into the lungs every 6 (six) hours as needed for wheezing or shortness of breath. Tawkaliyar, Roya, DO  Active   amLODipine  (NORVASC ) 10 MG tablet [id number] Yes Take 1 tablet (10 mg total) by mouth daily. Heddy Barren, DO  Active   aspirin  EC 81 MG tablet [id number] Yes Take 1 tablet (81 mg total) by mouth daily. Swallow whole. Heddy Barren, DO  Active   empagliflozin (JARDIANCE) 10 MG TABS tablet [id number] Yes Take 1 tablet (10 mg total) by mouth daily. Tawkaliyar, Roya, DO  Active   metoprolol succinate (TOPROL-XL) 25 MG 24 hr tablet [id number] Yes Take 1 tablet (25 mg total) by mouth daily. Tawkaliyar, Roya, DO  Active   nicotine  (NICODERM CQ  - DOSED IN MG/24 HOURS) 14 mg/24hr patch [id number] Yes Place 1 patch (14 mg total) onto the skin  daily. Tawkaliyar, Roya, DO  Active   rosuvastatin  (CRESTOR ) 20 MG tablet [id number] Yes Take 1 tablet (20 mg total) by mouth daily. Heddy Barren, DO  Active   sacubitril -valsartan  (ENTRESTO ) 24-26 MG [id number] Yes Take 1 tablet by mouth 2 (two) times daily. Tawkaliyar, Roya, DO  Active   spironolactone  (ALDACTONE ) 25 MG tablet [id number] Yes Take 0.5 tablets (12.5 mg total) by mouth daily. Heddy Barren, DO  Active             Home Care and Equipment/Supplies: Were Home Health Services Ordered?: NA Any new equipment or medical supplies ordered?: NA  Functional Questionnaire: Do you need assistance with bathing/showering or dressing?: No Do you need assistance with meal preparation?: No Do you need assistance with eating?: No Do you have difficulty maintaining continence: No Do you need assistance with getting out of bed/getting out of a chair/moving?: No Do you have difficulty managing or taking your medications?: No  Follow up appointments reviewed: PCP Follow-up appointment confirmed?: Yes Date of PCP follow-up appointment?: 04/29/24 Follow-up Provider: [facility] Specialist Hospital Follow-up appointment confirmed?: No Reason Specialist Follow-Up Not Confirmed: Patient has Specialist Provider Number and will Call for Appointment Do you need transportation to your follow-up appointment?: No Do you understand care options if your condition(s) worsen?: Yes-patient verbalized understanding    SIGNATURE Pan Emmitt, LPN [facility] Nurse Health Advisor Direct Dial [phone]

## 2024-04-26 ENCOUNTER — Other Ambulatory Visit: Payer: Self-pay | Admitting: Internal Medicine

## 2024-04-26 ENCOUNTER — Other Ambulatory Visit: Payer: Self-pay

## 2024-04-26 MED ORDER — AMLODIPINE BESYLATE 10 MG PO TABS
10.0000 mg | ORAL_TABLET | Freq: Every day | ORAL | 0 refills | Status: DC
  Filled 2024-04-26: qty 30, 30d supply, fill #0

## 2024-04-26 MED ORDER — SPIRONOLACTONE 25 MG PO TABS
12.5000 mg | ORAL_TABLET | Freq: Every day | ORAL | 0 refills | Status: DC
  Filled 2024-04-26: qty 30, 60d supply, fill #0

## 2024-04-26 MED ORDER — ROSUVASTATIN CALCIUM 20 MG PO TABS
20.0000 mg | ORAL_TABLET | Freq: Every day | ORAL | 0 refills | Status: DC
  Filled 2024-04-26: qty 30, 30d supply, fill #0

## 2024-04-26 MED ORDER — LOSARTAN POTASSIUM 50 MG PO TABS
50.0000 mg | ORAL_TABLET | Freq: Every day | ORAL | 0 refills | Status: DC
  Filled 2024-04-26: qty 30, 30d supply, fill #0

## 2024-04-26 MED ORDER — METOPROLOL TARTRATE 25 MG PO TABS
12.5000 mg | ORAL_TABLET | Freq: Two times a day (BID) | ORAL | 0 refills | Status: DC
  Filled 2024-04-26: qty 60, 60d supply, fill #0

## 2024-04-26 NOTE — Telephone Encounter (Signed)
 RTC to patient.  Informed me today that he has not heard from the Patient Counselor her in the building.  Patient also said that his [employer] for his medications will take effect on 05/14/2024.  Will send to C. Guillermina to see available options.

## 2024-04-26 NOTE — Progress Notes (Addendum)
 Patient came by Clinics today.  Picked up Jardiance 10 mg #14 tablets per ok of Dr. Lovie.  Lot #  [id number]  NDC- [phone] EXP 06/2025.

## 2024-04-26 NOTE — Telephone Encounter (Signed)
 Patient is calling to check status of his medications, he would like a callback to discuss his options.

## 2024-04-26 NOTE — Progress Notes (Signed)
 Patient was discharged on Sunday 10/12 with meds sent to [facility], but unfortunately his insurance for medications had lapsed, and he won't have med coverage until 05/14/2025.   He can get free medicines from [facility] list at [facility].  He's taking aspirin  81mg  daily from OTC.  Meds to pick up at [facility] today: - amlodipine  10mg  daily - metoprolol tartrate 12.5mg  BID (don't have XL on formulary) - rosuvastatin  20mg  daily - spironolactone  12.5mg  daily - losartan  50mg  daily (we don't have entresto )   He has follow up with Dr Kem later this week.   At that appointment, would consider decreasing amlodipine  & increasing GDMT, but I didn't make this change over the phone today.

## 2024-04-29 ENCOUNTER — Other Ambulatory Visit: Payer: Self-pay

## 2024-04-29 ENCOUNTER — Ambulatory Visit

## 2024-04-29 VITALS — BP 147/82 | HR 88 | Temp 97.4°F | Ht 65.0 in | Wt 161.8 lb

## 2024-04-29 DIAGNOSIS — Z0000 Encounter for general adult medical examination without abnormal findings: Secondary | ICD-10-CM

## 2024-04-29 DIAGNOSIS — F17200 Nicotine dependence, unspecified, uncomplicated: Secondary | ICD-10-CM

## 2024-04-29 DIAGNOSIS — I428 Other cardiomyopathies: Secondary | ICD-10-CM | POA: Diagnosis present

## 2024-04-29 DIAGNOSIS — F17210 Nicotine dependence, cigarettes, uncomplicated: Secondary | ICD-10-CM

## 2024-04-29 MED ORDER — CARVEDILOL 3.125 MG PO TABS
3.1250 mg | ORAL_TABLET | Freq: Two times a day (BID) | ORAL | 11 refills | Status: DC
  Filled 2024-04-29: qty 60, 30d supply, fill #0
  Filled 2024-05-24: qty 60, 30d supply, fill #1

## 2024-04-29 NOTE — Assessment & Plan Note (Addendum)
 Patient reports he is smoking 4 cigarettes a day. Did discuss getting nicotine  patches with patient and he is motivated to quit. Patient was also supposed to get CT chest last year but did not hear about scheduling. May need to send new order for this or give him the number to call and schedule at next visit once he has insurance

## 2024-04-29 NOTE — Progress Notes (Signed)
 Established Patient Office Visit  Subjective   Patient ID: Devon Vega, male    DOB: 03/29/1956  Age: 68 y.o. MRN: [id number]  Chief Complaint  Patient presents with   Hospitalization Follow-up   Medication Refill    Entresto - patients insurance elapsed he wants to know if we can send in a rx at which he can afford oop    HPI Devon Vega is a 68 year old male with Past Medical history of prediabetes, COPD, nonischemic cardiomyopathy, hypertension, hyperlipidemia, tobacco use disorder that presented with shortness of breath and was admitted for flash pulmonary edema secondary to acute heart failure exacerbation that presents today for hospital follow up.   Follow up Hospitalization  Patient was admitted to [facility] on 10/10 and discharged on 10/12. He was treated for Flash pulmonary edema with Acute HFrEF. Treatment for this included Nitroglycerin  infusion taper. Telephone follow up was done on 10/13 He reports good compliance with treatment. He reports this condition is resolved.  ----------------------------------------------------------------------------------------- -    ROS As noted in HPI and problem based assessment    Objective:     BP (!) 147/82 (BP Location: Left Arm, Patient Position: Sitting, Cuff Size: Normal)   Pulse 88   Temp (!) 97.4 F (36.3 C) (Oral)   Ht 5' 5 (1.651 m)   Wt 161 lb 12.8 oz (73.4 kg)   SpO2 98%   BMI 26.92 kg/m  BP Readings from Last 3 Encounters:  04/29/24 (!) 147/82  04/24/24 (!) 144/90  06/09/23 (!) 168/102   Wt Readings from Last 3 Encounters:  04/29/24 161 lb 12.8 oz (73.4 kg)  04/22/24 165 lb (74.8 kg)  06/09/23 174 lb 11.2 oz (79.2 kg)      Physical Exam Constitution:alert, no acute distress Heart: Regular rate and rhythm, no murmurs heard Lungs: No respiratory distress, clear to auscultation Neck: No JVD noted Lower extremities: no lower extremity edema bilaterally   No results found for  any visits on 04/29/24.    The 10-year ASCVD risk score (Arnett DK, et al., 2019) is: 60.7%    Assessment & Plan:   Problem List Items Addressed This Visit     Current smoker (Chronic)   Patient reports he is smoking 4 cigarettes a day. Did discuss getting nicotine  patches with patient and he is motivated to quit. Patient was also supposed to get CT chest last year but did not hear about scheduling. May need to send new order for this or give him the number to call and schedule at next visit once he has insurance       Healthcare maintenance (Chronic)   Will get flu shot at [facility] pharmacy as he does not have insurance coverage at this time       NICM (nonischemic cardiomyopathy) (HCC) - Primary (Chronic)   Non ischemic cardiomyopathy with EF of 20 to 25%. Trying to titrate GDMT at this time. Patient does not have insurance coverage until Nov 1. Was discharged on amlodipine  10, Jaridance 10 mg, Metoprolol succinate 12.5 mg BID, Entresto  twice daily, spironolactone  12.5 mg. Patient reports taking spironolactone  as 25 mg. He was also not able to get Entresto  due to insurance issues.   Plan: Gave patient number to set up appt with [facility] as he has not heard from them. Needs follow up with them and likely repeat echo Continue Jardiance 10 mg, Spironolactone  25 mg Will decrease amlodipine  to 5 mg  Also switched metoprolol succinate to carvedilol   3.125 twice daily  Patient has some home Entresto  dose of 97-103 mg so will take this until follow up with HF team  Will get BMP today       Relevant Medications   carvedilol  (COREG ) 3.125 MG tablet   Other Relevant Orders   Basic metabolic panel with GFR      Return in about 4 weeks (around 05/27/2024) for Heart failure, HTN .    Hagan Maltz D'Mello, DO Patient seen with Dr. Francesco

## 2024-04-29 NOTE — Assessment & Plan Note (Signed)
 Will get flu shot at [facility] pharmacy as he does not have insurance coverage at this time

## 2024-04-29 NOTE — Patient Instructions (Addendum)
 Today we discussed the following medical conditions and plan:   For your heart: you will take  Jardiance 10 mg Entresto  97-103 mg  Carvedilol  3.125 mg twice a day  Spironolactone  25 mg  Amlodipine  5 mg ( 1/2 a tablet)   [phone] Number for Heart Failure Clinic Let them know you are supposed to see Dr. Bensimhon and havent heard from anyone  Let us  know if you do not hear anything in a week   We look forward to seeing you next time. Please call our clinic at [phone] if you have any questions or concerns. The best time to call is Monday-Friday from 9am-4pm, but there is someone available 24/7. If you need medication refills, please notify your pharmacy one week in advance and they will send us  a request.   Thank you for trusting me with your care. Wishing you the best!   Tevis Conger D'Mello, DO  [facility]

## 2024-04-29 NOTE — Assessment & Plan Note (Addendum)
 Non ischemic cardiomyopathy with EF of 20 to 25%. Trying to titrate GDMT at this time. Patient does not have insurance coverage until Nov 1. Was discharged on amlodipine  10, Jaridance 10 mg, Metoprolol succinate 12.5 mg BID, Entresto  twice daily, spironolactone  12.5 mg. Patient reports taking spironolactone  as 25 mg. He was also not able to get Entresto  due to insurance issues.   Plan: Gave patient number to set up appt with [facility] as he has not heard from them. Needs follow up with them and likely repeat echo Continue Jardiance 10 mg, Spironolactone  25 mg Will decrease amlodipine  to 5 mg  Also switched metoprolol succinate to carvedilol  3.125 twice daily  Patient has some home Entresto  dose of 97-103 mg so will take this until follow up with HF team  Will get BMP today

## 2024-04-30 LAB — BASIC METABOLIC PANEL WITH GFR
BUN/Creatinine Ratio: 13 (ref 10–24)
BUN: 18 mg/dL (ref 8–27)
CO2: 23 mmol/L (ref 20–29)
Calcium: 9.9 mg/dL (ref 8.6–10.2)
Chloride: 101 mmol/L (ref 96–106)
Creatinine, Ser: 1.38 mg/dL — ABNORMAL HIGH (ref 0.76–1.27)
Glucose: 143 mg/dL — ABNORMAL HIGH (ref 70–99)
Potassium: 5 mmol/L (ref 3.5–5.2)
Sodium: 138 mmol/L (ref 134–144)
eGFR: 55 mL/min/1.73 — ABNORMAL LOW (ref >59)

## 2024-05-02 NOTE — Progress Notes (Signed)
 Internal Medicine Clinic Attending  I was physically present during the key portions of the resident provided service and participated in the medical decision making of patient's management care. I reviewed pertinent patient test results.  The assessment, diagnosis, and plan were formulated together and I agree with the documentation in the resident's note.  Francesco Elsie NOVAK, MD

## 2024-05-09 ENCOUNTER — Ambulatory Visit: Payer: Self-pay

## 2024-05-16 ENCOUNTER — Telehealth: Payer: Self-pay | Admitting: *Deleted

## 2024-05-16 DIAGNOSIS — I428 Other cardiomyopathies: Secondary | ICD-10-CM

## 2024-05-16 NOTE — Telephone Encounter (Signed)
 Copied from CRM [id number]. Topic: Referral - Request for Referral >> May 13, 2024  1:09 PM Diannia H wrote: Did the patient discuss referral with their provider in the last year? Yes (If No - schedule appointment) (If Yes - send message)  Appointment offered? No  Type of order/referral and detailed reason for visit: Cardiologist  Preference of office, provider, location: [facility] Dr. Cherrie [address] [place], [place] [address] [phone]  If referral order, have you been seen by this specialty before? Yes (If Yes, this issue or another issue? When? Where?  Can we respond through MyChart? Yes

## 2024-05-16 NOTE — Telephone Encounter (Signed)
 Pt stated he has the telephone # and he will call.

## 2024-05-20 ENCOUNTER — Other Ambulatory Visit (HOSPITAL_COMMUNITY): Payer: Self-pay

## 2024-05-20 NOTE — Telephone Encounter (Signed)
 Test claims as of 05/20/24:  Brand Ventolin $53.90 (generic Albuterol $23.21) Amlodipine  10mg  $0 - 30 day supply Aspirin  OTC Jardiance (filled 05/18/24, unable to run claim) Metoprolol $0 - 30 day supply Nicotine  patches not covered Rosuvastatin  $0 - 30 day supply Entresto  $75.30 - 30 day supply Spironolactone  $0 - 30 day supply  There are currently no assistance programs for any of these meds except the Jardiance.

## 2024-05-24 ENCOUNTER — Ambulatory Visit: Payer: Self-pay

## 2024-05-24 ENCOUNTER — Other Ambulatory Visit: Payer: Self-pay | Admitting: Internal Medicine

## 2024-05-24 ENCOUNTER — Other Ambulatory Visit: Payer: Self-pay

## 2024-05-24 NOTE — Telephone Encounter (Unsigned)
 Copied from CRM [id number]. Topic: Clinical - Medication Refill >> May 24, 2024 12:28 PM Suzette B wrote: Medication: carvedilol  (COREG ) 3.125 MG tablet  Has the patient contacted their pharmacy? Yes: Patient was standing at the pharmacy counter stating he needed his medication filled while he was there. I advised the patient of the time frame protocol he stated he needed his medication now. I informed him that I would send over to the nurse, and the nurse can communicate the information to the provider, but the provider has to sign off on it.   This is the patient's preferred pharmacy:  [facility] [phone] - [place], [place] - [address] AT [facility] [address] [place] [address] Phone: [phone] Fax: [phone]    Is this the correct pharmacy for this prescription? Yes If no, delete pharmacy and type the correct one.   Has the prescription been filled recently? Yes  Is the patient out of the medication? Yes  Has the patient been seen for an appointment in the last year OR does the patient have an upcoming appointment? Yes  Can we respond through MyChart? No  Agent: Please be advised that Rx refills may take up to 3 business days. We ask that you follow-up with your pharmacy.

## 2024-05-27 ENCOUNTER — Telehealth: Payer: Self-pay | Admitting: Pharmacy Technician

## 2024-05-27 ENCOUNTER — Other Ambulatory Visit: Payer: Self-pay

## 2024-05-27 ENCOUNTER — Ambulatory Visit (INDEPENDENT_AMBULATORY_CARE_PROVIDER_SITE_OTHER): Payer: Self-pay

## 2024-05-27 ENCOUNTER — Other Ambulatory Visit (HOSPITAL_COMMUNITY): Payer: Self-pay

## 2024-05-27 VITALS — BP 159/83 | HR 83 | Temp 97.9°F | Ht 65.0 in | Wt 158.6 lb

## 2024-05-27 DIAGNOSIS — Z87891 Personal history of nicotine dependence: Secondary | ICD-10-CM

## 2024-05-27 DIAGNOSIS — F17210 Nicotine dependence, cigarettes, uncomplicated: Secondary | ICD-10-CM | POA: Diagnosis not present

## 2024-05-27 DIAGNOSIS — Z7984 Long term (current) use of oral hypoglycemic drugs: Secondary | ICD-10-CM | POA: Diagnosis not present

## 2024-05-27 DIAGNOSIS — I5020 Unspecified systolic (congestive) heart failure: Secondary | ICD-10-CM | POA: Diagnosis not present

## 2024-05-27 DIAGNOSIS — E119 Type 2 diabetes mellitus without complications: Secondary | ICD-10-CM

## 2024-05-27 DIAGNOSIS — F17200 Nicotine dependence, unspecified, uncomplicated: Secondary | ICD-10-CM

## 2024-05-27 DIAGNOSIS — Z0000 Encounter for general adult medical examination without abnormal findings: Secondary | ICD-10-CM

## 2024-05-27 DIAGNOSIS — I428 Other cardiomyopathies: Secondary | ICD-10-CM | POA: Diagnosis not present

## 2024-05-27 DIAGNOSIS — Z79899 Other long term (current) drug therapy: Secondary | ICD-10-CM | POA: Diagnosis not present

## 2024-05-27 MED ORDER — AMLODIPINE BESYLATE 10 MG PO TABS: 10.0000 mg | ORAL_TABLET | Freq: Every day | ORAL | 6 refills | Status: DC

## 2024-05-27 MED ORDER — EMPAGLIFLOZIN 10 MG PO TABS: 10.0000 mg | ORAL_TABLET | Freq: Every day | ORAL | 3 refills | Status: AC

## 2024-05-27 MED ORDER — SACUBITRIL-VALSARTAN 97-103 MG PO TABS: 1.0000 | ORAL_TABLET | Freq: Two times a day (BID) | ORAL | 10 refills | Status: AC

## 2024-05-27 MED ORDER — SPIRONOLACTONE 25 MG PO TABS: 25.0000 mg | ORAL_TABLET | Freq: Every day | ORAL | 6 refills | Status: AC

## 2024-05-27 MED ORDER — ROSUVASTATIN CALCIUM 20 MG PO TABS: 20.0000 mg | ORAL_TABLET | Freq: Every day | ORAL | 6 refills | Status: AC

## 2024-05-27 MED ORDER — NICOTINE 21 MG/24HR TD PT24: 21.0000 mg | MEDICATED_PATCH | TRANSDERMAL | 1 refills | Status: AC

## 2024-05-27 MED ORDER — CARVEDILOL 3.125 MG PO TABS: 3.1250 mg | ORAL_TABLET | Freq: Two times a day (BID) | ORAL | 11 refills | Status: DC

## 2024-05-27 MED ORDER — CARVEDILOL 12.5 MG PO TABS: 12.5000 mg | ORAL_TABLET | Freq: Two times a day (BID) | ORAL | 11 refills | Status: AC

## 2024-05-27 NOTE — Assessment & Plan Note (Addendum)
 Echo 04/22/2024: EF 20-25%.  LV severely decreased function.  LV global hypokinesis.  RV normal.  LA moderately dilated.  Mild mitral valve regurgitation.  Mild aortic valve calcification.  Mild aortic valve regurgitation. Medication management includes: Jardiance 10 mg, spironolactone  25 mg, amlodipine  10 mg, carvedilol  3.125 mg BID, Entresto  97-103 mg BID.  Medication per patient: patient had not been taking Entresto  97-103 mg BID, he had only been taking once daily BMP 04/29/2024: Creatinine 1.38, potassium 5, sodium 138 Cardiology Appt: Discussed with patient importance of following up with cardiology. Unfortunately, patient had not gotten a call back to schedule with cardiologist but stated he would call again today and if he was still having problems he would call clinic next week.  Blood Pressure at home: has not taken blood pressure at home in 2 weeks. Discussed importance of keeping a log, especially while making adjustments to medications. Patient brought blood pressure cuff to clinic but unfortunately it was not working. Instructed him to get new BP cuff.  Vitals:   05/27/24 0915 05/27/24 1009  BP: (!) 167/79 (!) 159/83    Current Blood pressures remain elevated and there is no baseline from home. He is not on optimized dosing of GDMT yet and he has been taking Entresto  only once daily. Will make optimizations, but consider switching amlodipine  to thiazide or adding BiDil for further BP management. Will follow up on BMP.  Patient had additional concerns about cost of Jardiance. Out of pocket costs is $300 after insurance. I spoke with Dr. Brinda with pharmacy and she is sending in application for heart failure heathwell grant to help cover cost of medications, in the meantime he will have to meet his deductible or pause taking this medication due to cost-prohibition.    - encouraged f/u with cardiology  - instructed to buy new BP cuff and keep a log - GDMT - Entresto  97-103 mg BID - increased  Carvedilol  to 12.5 mg BID (previously 3.125 mg daily) - Jardiance 10 mg daily  - Spironolactone  25 mg daily  - amlodipine  10 mg daily  - BMP ordered

## 2024-05-27 NOTE — Progress Notes (Signed)
 Patient reported difficulty affording HF GDMT on new Medicare plan. Enrolled in [employer] Cardiomyopathy grant today. Will communicate copay information to preferred pharmacy to cover the cost of Jardiance, Entresto , carvedilol , and spironolactone .  BIN: [id number] PCN: [id number] GRP: [id number] ID: [id number]  Enrolled through 04/26/25    Devon Vega, PharmD [facility] [phone]

## 2024-05-27 NOTE — Assessment & Plan Note (Deleted)
 Lab Results  Component Value Date   HGBA1C 5.9 (H) 04/22/2024   HGBA1C 6.6 (A) 06/09/2023   HGBA1C 6.0 (H) 06/09/2022       Latest Ref Rng & Units 04/29/2024   11:01 AM 04/24/2024    2:09 AM 04/23/2024    2:42 AM  BMP  Glucose 70 - 99 mg/dL 856  893  97   BUN 8 - 27 mg/dL 18  15  15    Creatinine 0.76 - 1.27 mg/dL 8.61  8.73  8.85   BUN/Creat Ratio 10 - 24 13     Sodium 134 - 144 mmol/L 138  137  137   Potassium 3.5 - 5.2 mmol/L 5.0  4.0  4.0   Chloride 96 - 106 mmol/L 101  101  103   CO2 20 - 29 mmol/L 23  24  23    Calcium  8.6 - 10.2 mg/dL 9.9  9.3  8.9     Medications: jardiance 10 mg Opthalmology:  Foot Exam: deferred to tomorrow  Diabetic Kidney Evaluation:

## 2024-05-27 NOTE — Assessment & Plan Note (Signed)
-  Provided patient with phone number for Opthomologist -instructed patient to go to pharmacy for flu shot  -referral for colonoscopy sent  -low dose CT chest for lung cancer screening given smoking history sent  -Microalbumin/Cr ratio ordered

## 2024-05-27 NOTE — Progress Notes (Signed)
   05/27/2024 Name: Devon Vega MRN: [id number] DOB: 03/11/1956  Patient is appearing for a follow-up visit with the population health pharmacy technician. Last engaged with the clinical pharmacist to discuss medication access on 05/27/2024. Contacted pharmacy today to discuss applying coupon card.   Plan from last clinical pharmacist appointment:  Patient reported difficulty affording HF GDMT on new Medicare plan. Enrolled in [employer] grant today. Will communicate copay information to preferred pharmacy to cover the cost of Jardiance, Entresto , carvedilol , and spironolactone .   BIN: [id number] PCN: [id number] GRP: [id number] ID: [id number]   Enrolled through 04/26/25  (copy/paste from last note)   Medication Adherence Barriers Identified:  Received the following message from PharmD: [name] This afternoon or Mon morning can you call his pharmacy ([facility] [address]) to apply this healthwell grant information to his prescriptions for Jardiance and Entresto . I have asked PCP to place a new order for Entresto  when she is able. Let me know if you have any questions or concerns! Thank you. Lorain Baseman, PharmD  Access issues with any new medication or testing device: Yes Jardiance and Entresto    Medication Adherence Barriers Addressed/Actions Taken:  Medication Access for Jardiance and Entresto  Contacted pharmacy regarding new prescriptions [address] and spoke to [place]. Provided Shada the billing information for Entresto  and Bernadine Lama informs Bernadine is too soon as it was filled and picked up for 20 tablets on 05/18/24. She informs she would put in copay card to bill as secondary The generic Entresto  was around $70.00 but after processing the copay card as secondary the cost went do $0. She informs she received 2 prescriptions today for Carvedilol  and is inquiring about the dose.  Per the note from MD today, the Carvedilol  3.125mg  was discontinued and patient is  now on 12.5mg  twice a day.   Next clinical pharmacist appointment is scheduled for: TBD  [name], CPhT [facility] Office: [phone] Email: [email or web address]

## 2024-05-27 NOTE — Progress Notes (Signed)
 CC: 4-week follow-up  HPI:  Devon Vega is a 68 y.o. male with pertinent past medical history of prediabetes, COPD, nonischemic cardiomyopathy, hypertension, hyperlipidemia, tobacco use disorder (further medical history stated below) and presents today for 4-week follow-up. Please see problem based assessment and plan for additional details.  Last clinic appointment: 04/29/2024: Hospital follow-up for flash pulmonary edema with acute HFrEF (04/22/2024).  Was discharged from the hospital amlodipine  10 mg, Jardiance 10 mg, metoprolol succinate 12.5 mg twice daily, Entresto  twice daily, spironolactone  12.5 mg.  He was not able to get the Entresto  due to insurance issues.  Last Pertinent Labs Documented:     Latest Ref Rng & Units 04/29/2024   11:01 AM 04/24/2024    2:09 AM 04/23/2024    2:42 AM  BMP  Glucose 70 - 99 mg/dL 856  893  97   BUN 8 - 27 mg/dL 18  15  15    Creatinine 0.76 - 1.27 mg/dL 8.61  8.73  8.85   BUN/Creat Ratio 10 - 24 13     Sodium 134 - 144 mmol/L 138  137  137   Potassium 3.5 - 5.2 mmol/L 5.0  4.0  4.0   Chloride 96 - 106 mmol/L 101  101  103   CO2 20 - 29 mmol/L 23  24  23    Calcium  8.6 - 10.2 mg/dL 9.9  9.3  8.9        Latest Ref Rng & Units 04/24/2024    2:09 AM 04/23/2024    2:41 AM 04/22/2024    5:33 AM  CBC  WBC 4.0 - 10.5 K/uL 9.4  12.8  11.8   Hemoglobin 13.0 - 17.0 g/dL 82.6  85.9  85.1   Hematocrit 39.0 - 52.0 % 53.6  43.0  47.1   Platelets 150 - 400 K/uL 314  198  239     Lab Results  Component Value Date   HGBA1C 5.9 (H) 04/22/2024   HGBA1C 6.6 (A) 06/09/2023   HGBA1C 6.0 (H) 06/09/2022     Past Medical History:  Diagnosis Date   Alcohol abuse    hx of , sober since 1997   CHF (congestive heart failure) (HCC) 03/02/2017   Chronic cough 03/10/2017   COPD (chronic obstructive pulmonary disease) (HCC)    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   Nerve compression 12/09/2018   Prediabetes 03/10/2022   Right shoulder pain 03/25/2018   Sinus arrhythmia 05/24/2014   Tobacco abuse    failed zyban   Weight gain     Current Outpatient Medications on File Prior to Visit  Medication Sig Dispense Refill   albuterol (VENTOLIN HFA) 108 (90 Base) MCG/ACT inhaler Inhale 2 puffs into the lungs every 6 (six) hours as needed for wheezing or shortness of breath. 8 g 0   aspirin  EC 81 MG tablet Take 1 tablet (81 mg total) by mouth daily. Swallow whole. 30 tablet 0   empagliflozin (JARDIANCE) 10 MG TABS tablet Take 1 tablet (10 mg total) by mouth daily. 30 tablet 0   nicotine  (NICODERM CQ  - DOSED IN MG/24 HOURS) 14 mg/24hr patch Place 1  patch (14 mg total) onto the skin daily. 28 patch 0   No current facility-administered medications on file prior to visit.    Family History  Problem Relation Age of Onset   Cancer Father        Lung   Parkinson's disease Father    Renal Disease Brother        On Dialysis   Heart failure Brother    Hypertension Brother    Hypertension Mother    Stroke Brother     Social History   Socioeconomic History   Marital status: Divorced    Spouse name: Not on file   Number of children: 0   Years of education: Not on file   Highest education level: Not on file  Occupational History   Occupation: Retired    Comment: Counsellor at [employer]  Tobacco Use   Smoking status: Every Day    Current packs/day: 0.50    Average packs/day: 0.5 packs/day for 52.0 years (26.0 ttl pk-yrs)    Types: Cigarettes   Smokeless tobacco: Never   Tobacco comments:    0.5 PPD  Vaping Use   Vaping status: Never Used  Substance and Sexual Activity   Alcohol use: Not Currently     Comment: 2 beers/week   Drug use: Yes    Types: Marijuana   Sexual activity: Not on file  Other Topics Concern   Not on file  Social History Narrative   Current smoker-  0.5 ppd x ~ 45 yrs   Alcohol use- ~2 beers/day; 6 pk beer over a weekend   Divorced, married 17 yrs- no children      Current Social History 11/15/2020        Patient lives alone in a one level home with 4 outside steps with handrails       Patient's method of transportation is personal vehicle.      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       Pets : Male cat, Nutritional Therapist / Fun: Work in hydrographic surveyor garden, ride bike, walk       Current Stressors: No stress       Religious / Personal Beliefs: Baptist       L. Ducatte, BSN, RN-BC       Social Drivers of Health   Financial Resource Strain: Low Risk  (03/04/2023)   Overall Financial Resource Strain (CARDIA)    Difficulty of Paying Living Expenses: Not very hard  Food Insecurity: No Food Insecurity (04/22/2024)   Hunger Vital Sign    Worried About Running Out of Food in the Last Year: Never true    Ran Out of Food in the Last Year: Never true  Transportation Needs: No Transportation Needs (04/22/2024)   PRAPARE - Administrator, Civil Service (Medical): No    Lack of Transportation (Non-Medical): No  Physical Activity: Insufficiently Active (03/04/2023)   Exercise Vital Sign    Days of Exercise per Week: 2 days    Minutes of Exercise per Session: 40 min  Stress: No Stress Concern Present (12/13/2021)   [employer] of Occupational Health - Occupational Stress Questionnaire    Feeling of Stress : Not at all  Social Connections: Unknown (04/22/2024)   Social Connection and Isolation Panel  Frequency of Communication with Friends and Family: Three times a week    Frequency  of Social Gatherings with Friends and Family: Not on file    Attends Religious Services: Not on file    Active Member of Clubs or Organizations: Not on file    Attends Banker Meetings: Not on file    Marital Status: Not on file  Intimate Partner Violence: Unknown (04/22/2024)   Humiliation, Afraid, Rape, and Kick questionnaire    Fear of Current or Ex-Partner: No    Emotionally Abused: No    Physically Abused: Not on file    Sexually Abused: Not on file    Review of Systems: Review of Systems  Respiratory:  Negative for cough and shortness of breath.   Cardiovascular:  Negative for chest pain, palpitations, orthopnea and leg swelling.     Vitals:   05/27/24 0915 05/27/24 1009  BP: (!) 167/79 (!) 159/83  Pulse: 92   Temp: 97.9 F (36.6 C)   TempSrc: Oral   SpO2: 100%   Weight: 158 lb 9.6 oz (71.9 kg)   Height: 5' 5 (1.651 m)     Physical Exam: Physical Exam Constitutional:      General: He is not in acute distress.    Appearance: He is not ill-appearing or toxic-appearing.  Cardiovascular:     Rate and Rhythm: Normal rate and regular rhythm.     Heart sounds: Normal heart sounds. No murmur heard.    No friction rub. No gallop.  Pulmonary:     Effort: Pulmonary effort is normal.     Breath sounds: Normal breath sounds. No stridor. No wheezing, rhonchi or rales.  Abdominal:     Tenderness: There is no abdominal tenderness.  Musculoskeletal:     Right lower leg: No edema.     Left lower leg: No edema.  Skin:    General: Skin is warm and dry.  Neurological:     Mental Status: He is alert.      Assessment & Plan:   Patient seen with Dr. Shawn Assessment & Plan HFrEF (heart failure with reduced ejection fraction) (HCC) NICM (nonischemic cardiomyopathy) (HCC) Echo 04/22/2024: EF 20-25%.  LV severely decreased function.  LV global hypokinesis.  RV normal.  LA moderately dilated.  Mild mitral valve regurgitation.  Mild aortic valve calcification.  Mild  aortic valve regurgitation. Medication management includes: Jardiance 10 mg, spironolactone  25 mg, amlodipine  10 mg, carvedilol  3.125 mg BID, Entresto  97-103 mg BID.  Medication per patient: patient had not been taking Entresto  97-103 mg BID, he had only been taking once daily BMP 04/29/2024: Creatinine 1.38, potassium 5, sodium 138 Cardiology Appt: Discussed with patient importance of following up with cardiology. Unfortunately, patient had not gotten a call back to schedule with cardiologist but stated he would call again today and if he was still having problems he would call clinic next week.  Blood Pressure at home: has not taken blood pressure at home in 2 weeks. Discussed importance of keeping a log, especially while making adjustments to medications. Patient brought blood pressure cuff to clinic but unfortunately it was not working. Instructed him to get new BP cuff.  Vitals:   05/27/24 0915 05/27/24 1009  BP: (!) 167/79 (!) 159/83    Current Blood pressures remain elevated and there is no baseline from home. He is not on optimized dosing of GDMT yet and he has been taking Entresto  only once daily. Will make optimizations, but consider switching amlodipine  to thiazide  or adding BiDil for further BP management. Will follow up on BMP.  Patient had additional concerns about cost of Jardiance. Out of pocket costs is $300 after insurance. I spoke with Dr. Brinda with pharmacy and she is sending in application for heart failure heathwell grant to help cover cost of medications, in the meantime he will have to meet his deductible or pause taking this medication due to cost-prohibition.    - encouraged f/u with cardiology  - instructed to buy new BP cuff and keep a log - GDMT - Entresto  97-103 mg BID - increased Carvedilol  to 12.5 mg BID (previously 3.125 mg daily) - Jardiance 10 mg daily  - Spironolactone  25 mg daily  - amlodipine  10 mg daily  - BMP ordered   Healthcare maintenance -Provided  patient with phone number for Opthomologist -instructed patient to go to pharmacy for flu shot  -referral for colonoscopy sent  -low dose CT chest for lung cancer screening given smoking history sent  -Microalbumin/Cr ratio ordered    Orders Placed This Encounter  Procedures   Basic metabolic panel with GFR   Microalbumin / Creatinine Urine Ratio   Ambulatory referral to Gastroenterology    Referral Priority:   Routine    Referral Type:   Consultation    Referral Reason:   Specialty Services Required    Number of Visits Requested:   1     Deleon Passe, D.O. [facility] Internal Medicine, PGY-1 Date 05/27/2024 Time 10:13 AM

## 2024-05-27 NOTE — Patient Instructions (Addendum)
 Today we discussed the following medical conditions and plan:   - Follow up with the cardiologist   - get new Blood Pressure machine  -Take your blood pressure twice daily, ideally 1 time 2 hours after you take your medication.  Keep a log of your blood pressure. Keep both feet on the floor with legs uncrossed, keep your arm at the level of your heart, make sure you are relaxed before taking the blood pressure reading.   - take entresto  TWICE daily (every 12 hours)  - take carvidolol ** new dose ** 12.5 mg TWICE daily (every 12 hours)  - take all your other medications ONCE DAILY (1 tablet of each once daily)  - stop drinking soda at night and you should stop peeing so much at night  - follow up with the eye doctor (referral sent) Phone: [phone]  - referral sent for colonoscopy  - sent in for CT chest for lung cancer   - we are working with pharmacist to help make your medications more affordable. Lets follow up in 2 weeks.   We look forward to seeing you next time. Please call our clinic at [phone] if you have any questions or concerns. The best time to call is Monday-Friday from 9am-4pm, but there is someone available 24/7. If you need medication refills, please notify your pharmacy one week in advance and they will send us  a request.   Thank you for trusting me with your care. Wishing you the best!   Sallyanne Primas, DO  [facility]

## 2024-05-28 LAB — BASIC METABOLIC PANEL WITH GFR
BUN/Creatinine Ratio: 11 (ref 10–24)
BUN: 13 mg/dL (ref 8–27)
Calcium: 10.6 mg/dL — ABNORMAL HIGH (ref 8.6–10.2)
Chloride: 105 mmol/L (ref 96–106)
Creatinine, Ser: 1.18 mg/dL (ref 0.76–1.27)
Glucose: 111 mg/dL — ABNORMAL HIGH (ref 70–99)
Potassium: 4.7 mmol/L (ref 3.5–5.2)
Sodium: 143 mmol/L (ref 134–144)
eGFR: 66 mL/min/1.73 (ref >59)

## 2024-05-28 LAB — MICROALBUMIN / CREATININE URINE RATIO
Creatinine, Urine: 138.3 mg/dL
Microalb/Creat Ratio: 77 mg/g{creat} — ABNORMAL HIGH (ref 0–29)
Microalbumin, Urine: 106.4 ug/mL

## 2024-05-30 ENCOUNTER — Ambulatory Visit: Payer: Self-pay

## 2024-05-30 NOTE — Telephone Encounter (Signed)
 Discussed with patient on the phone 05/30/2024 results of BMP and ACR.  Patient stated that he has had no concerns taking his medications as discussed at the last clinic appointment.  Stated that overall he is feeling well.  Patient had no questions at this time.

## 2024-05-30 NOTE — Progress Notes (Signed)
 Internal Medicine Clinic Attending  I was physically present during the key portions of the resident provided service and participated in the medical decision making of patient's management care. I reviewed pertinent patient test results.  The assessment, diagnosis, and plan were formulated together and I agree with the documentation in the resident's note.  Shawn Sick, MD

## 2024-06-08 ENCOUNTER — Ambulatory Visit (HOSPITAL_COMMUNITY)

## 2024-06-24 ENCOUNTER — Other Ambulatory Visit: Payer: Self-pay | Admitting: Student

## 2024-06-24 ENCOUNTER — Ambulatory Visit (HOSPITAL_COMMUNITY)
Admission: RE | Admit: 2024-06-24 | Discharge: 2024-06-24 | Disposition: A | Source: Ambulatory Visit | Attending: Internal Medicine | Admitting: Internal Medicine

## 2024-06-24 ENCOUNTER — Encounter (HOSPITAL_COMMUNITY): Payer: Self-pay | Admitting: Internal Medicine

## 2024-06-24 VITALS — BP 150/80 | HR 76 | Wt 164.0 lb

## 2024-06-24 DIAGNOSIS — Z720 Tobacco use: Secondary | ICD-10-CM

## 2024-06-24 DIAGNOSIS — I5022 Chronic systolic (congestive) heart failure: Secondary | ICD-10-CM | POA: Diagnosis not present

## 2024-06-24 DIAGNOSIS — I10 Essential (primary) hypertension: Secondary | ICD-10-CM | POA: Diagnosis not present

## 2024-06-24 LAB — BASIC METABOLIC PANEL WITH GFR
Anion gap: 8 (ref 5–15)
BUN: 12 mg/dL (ref 8–23)
CO2: 25 mmol/L (ref 22–32)
Calcium: 9.3 mg/dL (ref 8.9–10.3)
Chloride: 103 mmol/L (ref 98–111)
Creatinine, Ser: 1.06 mg/dL (ref 0.61–1.24)
GFR, Estimated: >60 mL/min (ref >60)
Glucose, Bld: 126 mg/dL — ABNORMAL HIGH (ref 70–99)
Potassium: 4.4 mmol/L (ref 3.5–5.1)
Sodium: 136 mmol/L (ref 135–145)

## 2024-06-24 LAB — BRAIN NATRIURETIC PEPTIDE: B Natriuretic Peptide: 57.3 pg/mL (ref 0.0–100.0)

## 2024-06-24 MED ORDER — ISOSORBIDE MONONITRATE ER 30 MG PO TB24: 30.0000 mg | ORAL_TABLET | Freq: Every day | ORAL | 5 refills | Status: AC

## 2024-06-24 MED ORDER — FUROSEMIDE 20 MG PO TABS: 20.0000 mg | ORAL_TABLET | Freq: Every day | ORAL | 5 refills | Status: AC | PRN

## 2024-06-24 MED ORDER — HYDRALAZINE HCL 25 MG PO TABS: 25.0000 mg | ORAL_TABLET | Freq: Two times a day (BID) | ORAL | 5 refills | Status: AC

## 2024-06-24 NOTE — Progress Notes (Signed)
 ReDS Vest / Clip - 06/24/24 1000       ReDS Vest / Clip   Station Marker A    Ruler Value 27    ReDS Value Range Moderate volume overload    ReDS Actual Value 36

## 2024-06-24 NOTE — Patient Instructions (Signed)
 Medication Changes:  START HYDRALAZINE  25MG  TWICE DAIY   START ISOSORBIDE  30MG  ONCE DAILY   START LASIX  (FUROSEMIDE ) 20MG  ONCE DAILY AS NEEDED FOR SWELLING OR WEIGHT GAIN OF 3 POUNDS OVERNIGHT OR 5 POUNDS IN 1 WEEK   Lab Work:  Labs done today, your results will be available in MyChart, we will contact you for abnormal readings.  Follow-Up in: PHARMACY IN 1 MONTH AND THEN 3 MONTHS WITH APP WITH AN ECHO AS SCHEDULED   At the [facility], you and your health needs are our priority. We have a designated team specialized in the treatment of Heart Failure. This Care Team includes your primary Heart Failure Specialized Cardiologist (physician), Advanced Practice Providers (APPs- Physician Assistants and Nurse Practitioners), and Pharmacist who all work together to provide you with the care you need, when you need it.   You may see any of the following providers on your designated Care Team at your next follow up:  Dr. Toribio Fuel Dr. Ezra Shuck Dr. Odis Brownie Greig Mosses, NP Caffie Shed, [place] [facility] [place], [place] Beckey Coe, NP Jordan Lee, NP Tinnie Redman, PharmD   Please be sure to bring in all your medications bottles to every appointment.   Need to Contact Us :  If you have any questions or concerns before your next appointment please send us  a message through [place] or call our office at [phone].    TO LEAVE A MESSAGE FOR THE NURSE SELECT OPTION 2, PLEASE LEAVE A MESSAGE INCLUDING: YOUR NAME DATE OF BIRTH CALL BACK NUMBER REASON FOR CALL**this is important as we prioritize the call backs  YOU WILL RECEIVE A CALL BACK THE SAME DAY AS LONG AS YOU CALL BEFORE 4:00 PM

## 2024-06-24 NOTE — Addendum Note (Signed)
 Encounter addended by: Dempsy Damiano M, RN on: 06/24/2024 10:01 AM  Actions taken: Flowsheet accepted, Clinical Note Signed

## 2024-06-24 NOTE — Addendum Note (Signed)
 Encounter addended by: Robinette Esters M, RN on: 06/24/2024 10:03 AM  Actions taken: Order list changed, Diagnosis association updated, Charge Capture section accepted

## 2024-06-24 NOTE — Progress Notes (Addendum)
 [facility] Consult  Note    Date:  06/24/2024   ID:  ELWARD NOCERA, DOB 11-17-1955, MRN [id number]  Location: Home  Provider location: [place] [facility] Type of Visit: Established patient   PCP:  Karna Fellows, MD  Cardiologist:  None Primary HF: Dr Cherrie   Chief Complaint: Heart Failure   History of Present Illness: Devon Vega is a 68 y.o. male with a history of with systolic CHF due to NICM, poorly controlled HTN, HLD, COPD, tobacco abuse, and alcohol abuse. Referred to Dr. Debera   Admitted 8/17-8/20/18 with acute dyspnea. Found to have marked hypertension in 180s and tachycardia in 110s. Medications adjusted with symptomatic improvement.  Echo with depressed EF as below.  Cath 03/02/17 with No CAD and low filling pressures. SABRA   ECHO 11/11/2017 which showed EF improving 40-45% and mod AI.   Echo 2020 EF 60-65%  Mild to moderate AI   Lost to f/u for several years.   Admitted in 10/25 with recurrent and HTN crisis after stopping meds for at least 6 months. Echo EF 20-25% Diuresed and meds restarted.   Says he feels pretty good. Smoking 1/2 ppd. No CP, edema, orthopnea or PND. Compliant meds.    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   Nerve compression 12/09/2018   Prediabetes 03/10/2022   Right shoulder pain 03/25/2018   Sinus arrhythmia 05/24/2014   Tobacco abuse    failed  zyban   Weight gain    Past Surgical History:  Procedure Laterality Date   COLONOSCOPY     EXCISION METACARPAL MASS Right 02/03/2013   Procedure: WIDE RESECTION OF GRANULAR CELL TUMOR;  Surgeon: Lamar LULLA Leonor Mickey., MD;  Location: [facility];  Service: Orthopedics;  Laterality: Right;   LYMPH NODE BIOPSY  2011   lt axilla-negative   MASS EXCISION Right 01/21/2013   Procedure: BIOPSY OF MASS RIGHT RING FINGER;  Surgeon: Lamar LULLA Leonor Mickey., MD;  Location: [facility];  Service: Orthopedics;  Laterality: Right;   RIGHT/LEFT HEART CATH AND CORONARY ANGIOGRAPHY N/A 03/02/2017   Procedure: RIGHT/LEFT HEART CATH AND CORONARY ANGIOGRAPHY;  Surgeon: Cherrie Toribio SAUNDERS, MD;  Location: [facility];  Service: Cardiovascular;  Laterality: N/A;   SKIN FULL THICKNESS GRAFT Right 02/03/2013   Procedure: SKIN GRAFT FULL THICKNESS RIGHT RING FINGER;  Surgeon: Lamar LULLA Leonor Mickey., MD;  Location: [facility];  Service: Orthopedics;  Laterality: Right;     Current Outpatient Medications  Medication Sig Dispense Refill   albuterol  (VENTOLIN  HFA) 108 (90 Base) MCG/ACT inhaler Inhale 2 puffs into the lungs every 6 (six) hours as needed for wheezing or shortness of breath. 8 g 0   amLODipine  (NORVASC ) 10 MG tablet Take 1 tablet (10 mg total) by mouth daily. 30 tablet 6   aspirin  EC 81  MG tablet Take 1 tablet (81 mg total) by mouth daily. Swallow whole. 30 tablet 0   carvedilol  (COREG ) 12.5 MG tablet Take 1 tablet (12.5 mg total) by mouth 2 (two) times daily. 60 tablet 11   empagliflozin  (JARDIANCE ) 10 MG TABS tablet Take 1 tablet (10 mg total) by mouth daily. 30 tablet 3   rosuvastatin  (CRESTOR ) 20 MG tablet Take 1 tablet (20 mg total) by mouth daily. 30 tablet 6   sacubitril -valsartan  (ENTRESTO ) 97-103 MG Take 1 tablet by mouth 2 (two) times daily. 60 tablet 10   spironolactone  (ALDACTONE ) 25 MG tablet Take 1 tablet (25 mg total) by mouth daily. 30 tablet 6    nicotine  (NICODERM CQ  - DOSED IN MG/24 HOURS) 21 mg/24hr patch Place 1 patch (21 mg total) onto the skin daily. (Patient not taking: Reported on 06/24/2024) 30 patch 1   No current facility-administered medications for this encounter.    Allergies:   Codeine and Lisinopril   Social History:  The patient  reports that he has been smoking cigarettes. He has a 26 pack-year smoking history. He has never used smokeless tobacco. He reports that he does not currently use alcohol. He reports current drug use. Drug: Marijuana.   Family History:  The patient's family history includes Cancer in his father; Heart failure in his brother; Hypertension in his brother and mother; Parkinson's disease in his father; Renal Disease in his brother; Stroke in his brother.   ROS:  Please see the history of present illness.   All other systems are personally reviewed and negative.   Exam:  General:  Sitting up. No resp difficulty HEENT: normal Neck: supple. no JVD.  Cor: Regular rate & rhythm. No rubs, gallops or murmurs. Lungs: clear Abdomen: soft, nontender, nondistended.Good bowel sounds. Extremities: no cyanosis, clubbing, rash, edema Neuro: alert & orientedx3, cranial nerves grossly intact. moves all 4 extremities w/o difficulty. Affect pleasant    Recent Labs: 04/22/2024: ALT 31; B Natriuretic Peptide 1,365.8 04/24/2024: Hemoglobin 17.3; Magnesium 2.5; Platelets 314 05/27/2024: BUN 13; Creatinine, Ser 1.18; Potassium 4.7; Sodium 143  Personally reviewed   Wt Readings from Last 3 Encounters:  06/24/24 74.4 kg (164 lb)  05/27/24 71.9 kg (158 lb 9.6 oz)  04/29/24 73.4 kg (161 lb 12.8 oz)      ASSESSMENT AND PLAN:  1. Chronic systolic HF. NICM-.suspect HTN CM  - Echo 11/11/17: EF 40-45%, mod AI - R/LHC 03/02/17 with normal coronaries and low filling pressures.  - Echo 11/11/17: EF 40-45%, mod AI. - Echo 03/28/19: EF 60-65% mild-mod AI - Echo 10/25 EF 20-25% mild AI/MR - NYHA I-II Volume ok ReDS 36%   - Continue Entresto  97/103 bid - Continue Jardiance  10  - Continue carvedilol  12.5 bid - Continue carvedilol  12.5 bid - Start hydral 25 bid and imdur  30 daily - Will give lasix  20mg  prn    2. Hypertension - BP remains a bit high  3. Tobacco use - Smoking 1/2 ppd - Encouraged cessation    4. ETOH abuse - Drinks 2 Heinekens per day - suggested cutting back     Signed, Toribio Fuel, MD  06/24/2024 9:11 AM  [facility] [address] [facility] [place] [address] [phone] (office) [phone] (fax)

## 2024-06-29 ENCOUNTER — Ambulatory Visit (HOSPITAL_COMMUNITY)

## 2024-07-06 ENCOUNTER — Ambulatory Visit

## 2024-07-06 VITALS — Ht 65.0 in | Wt 165.0 lb

## 2024-07-06 DIAGNOSIS — Z0000 Encounter for general adult medical examination without abnormal findings: Secondary | ICD-10-CM | POA: Diagnosis not present

## 2024-07-06 NOTE — Patient Instructions (Signed)
 Devon Vega,  Thank you for taking the time for your Medicare Wellness Visit. I appreciate your continued commitment to your health goals. Please review the care plan we discussed, and feel free to reach out if I can assist you further.  Please note that Annual Wellness Visits do not include a physical exam. Some assessments may be limited, especially if the visit was conducted virtually. If needed, we may recommend an in-person follow-up with your provider.  Ongoing Care Seeing your primary care provider every 3 to 6 months helps us  monitor your health and provide consistent, personalized care.   Referrals If a referral was made during today's visit and you haven't received any updates within two weeks, please contact the referred provider directly to check on the status.  Recommended Screenings:  Health Maintenance  Topic Date Due   Complete foot exam   Never done   Eye exam for diabetics  Never done   Zoster (Shingles) Vaccine (1 of 2) Never done   Stool Blood Test  Never done   Screening for Lung Cancer  Never done   Colon Cancer Screening  03/09/2019   Medicare Annual Wellness Visit  03/03/2024   COVID-19 Vaccine (4 - 2025-26 season) 03/14/2024   DTaP/Tdap/Td vaccine (5 - Td or Tdap) 05/06/2024   Flu Shot  10/11/2024*   Hemoglobin A1C  10/21/2024   Yearly kidney health urinalysis for diabetes  05/27/2025   Yearly kidney function blood test for diabetes  06/24/2025   Pneumococcal Vaccine for age over 26  Completed   Hepatitis C Screening  Completed   Meningitis B Vaccine  Aged Out  *Topic was postponed. The date shown is not the original due date.       07/06/2024   11:17 AM  Advanced Directives  Does Patient Have a Medical Advance Directive? Yes  Type of Advance Directive Living will    Vision: Annual vision screenings are recommended for early detection of glaucoma, cataracts, and diabetic retinopathy. These exams can also reveal signs of chronic conditions such as  diabetes and high blood pressure.  Dental: Annual dental screenings help detect early signs of oral cancer, gum disease, and other conditions linked to overall health, including heart disease and diabetes.  Please see the attached documents for additional preventive care recommendations.

## 2024-07-06 NOTE — Progress Notes (Signed)
 "  Chief Complaint  Patient presents with   Medicare Wellness    SUBSEQUENT     Subjective:   Devon Vega is a 68 y.o. male who presents for a Medicare Annual Wellness Visit.  Visit info / Clinical Intake: Medicare Wellness Visit Type:: Subsequent Annual Wellness Visit Persons participating in visit and providing information:: patient Medicare Wellness Visit Mode:: Telephone If telephone:: video declined Since this visit was completed virtually, some vitals may be partially provided or unavailable. Missing vitals are due to the limitations of the virtual format.: Documented vitals are patient reported If Telephone or Video please confirm:: I connected with patient using audio/video enable telemedicine. I verified patient identity with two identifiers, discussed telehealth limitations, and patient agreed to proceed. Patient Location:: HOME Provider Location:: HOME OFFICE Interpreter Needed?: No Pre-visit prep was completed: yes AWV questionnaire completed by patient prior to visit?: no Living arrangements:: (!) lives alone Patient's Overall Health Status Rating: good Typical amount of pain: none Does pain affect daily life?: no Are you currently prescribed opioids?: no  Dietary Habits and Nutritional Risks How many meals a day?: 2 Eats fruit and vegetables daily?: yes Most meals are obtained by: preparing own meals In the last 2 weeks, have you had any of the following?: none Diabetic:: (!) yes Any non-healing wounds?: no How often do you check your BS?: 0 (MEDICATION: JARDIANCE ) Would you like to be referred to a Nutritionist or for Diabetic Management? : no  Functional Status Activities of Daily Living (to include ambulation/medication): Independent Ambulation: Independent with device- listed below Home Assistive Devices/Equipment: Eyeglasses Medication Administration: Independent Home Management (perform basic housework or laundry): Independent Manage your own  finances?: yes Primary transportation is: driving Concerns about vision?: no *vision screening is required for WTM* Concerns about hearing?: no  Fall Screening Falls in the past year?: 0 Number of falls in past year: 0 Was there an injury with Fall?: 0 Fall Risk Category Calculator: 0 Patient Fall Risk Level: Low Fall Risk  Fall Risk Patient at Risk for Falls Due to: No Fall Risks Fall risk Follow up: Falls evaluation completed; Education provided  Home and Transportation Safety: All rugs have non-skid backing?: N/A, no rugs All stairs or steps have railings?: N/A, no stairs Grab bars in the bathtub or shower?: (!) no Have non-skid surface in bathtub or shower?: yes Good home lighting?: yes Regular seat belt use?: yes Hospital stays in the last year:: (!) yes How many hospital stays:: 1 Reason: Pulmonary Edema  Cognitive Assessment Difficulty concentrating, remembering, or making decisions? : no (NAMES) Will 6CIT or Mini Cog be Completed: yes What year is it?: 0 points What month is it?: 0 points Give patient an address phrase to remember (5 components): [place] [address] About what time is it?: 0 points Count backwards from 20 to 1: 0 points Say the months of the year in reverse: 0 points Repeat the address phrase from earlier: 0 points 6 CIT Score: 0 points  Advance Directives (For Healthcare) Does Patient Have a Medical Advance Directive?: Yes Type of Advance Directive: Living will Copy of Living Will in Chart?: No - copy requested  Reviewed/Updated  Reviewed/Updated: Reviewed All (Medical, Surgical, Family, Medications, Allergies, Care Teams, Patient Goals)    Allergies (verified) Codeine and Lisinopril   Current Medications (verified) Outpatient Encounter Medications as of 07/06/2024  Medication Sig   albuterol  (VENTOLIN  HFA) 108 (90 Base) MCG/ACT inhaler Inhale 2 puffs into the lungs every 6 (six) hours as  needed for wheezing or shortness of  breath.   amLODipine  (NORVASC ) 10 MG tablet TAKE 1 TABLET(10 MG) BY MOUTH DAILY   aspirin  EC 81 MG tablet Take 1 tablet (81 mg total) by mouth daily. Swallow whole.   carvedilol  (COREG ) 12.5 MG tablet Take 1 tablet (12.5 mg total) by mouth 2 (two) times daily.   empagliflozin  (JARDIANCE ) 10 MG TABS tablet Take 1 tablet (10 mg total) by mouth daily.   furosemide  (LASIX ) 20 MG tablet Take 1 tablet (20 mg total) by mouth daily as needed for fluid or edema.   hydrALAZINE  (APRESOLINE ) 25 MG tablet Take 1 tablet (25 mg total) by mouth 2 (two) times daily.   isosorbide  mononitrate (IMDUR ) 30 MG 24 hr tablet Take 1 tablet (30 mg total) by mouth daily.   nicotine  (NICODERM CQ  - DOSED IN MG/24 HOURS) 21 mg/24hr patch Place 1 patch (21 mg total) onto the skin daily. (Patient not taking: Reported on 06/24/2024)   rosuvastatin  (CRESTOR ) 20 MG tablet Take 1 tablet (20 mg total) by mouth daily.   sacubitril -valsartan  (ENTRESTO ) 97-103 MG Take 1 tablet by mouth 2 (two) times daily.   spironolactone  (ALDACTONE ) 25 MG tablet Take 1 tablet (25 mg total) by mouth daily.   No facility-administered encounter medications on file as of 07/06/2024.    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   Nerve compression 12/09/2018   Prediabetes 03/10/2022   Right shoulder pain 03/25/2018   Sinus arrhythmia 05/24/2014   Tobacco abuse    failed zyban   Weight gain     Past Surgical History:  Procedure Laterality Date   COLONOSCOPY     EXCISION METACARPAL MASS Right 02/03/2013   Procedure: WIDE RESECTION OF GRANULAR CELL TUMOR;  Surgeon: Lamar LULLA Leonor Mickey., MD;  Location: [facility];  Service: Orthopedics;  Laterality: Right;   LYMPH NODE BIOPSY  2011   lt axilla-negative   MASS EXCISION Right 01/21/2013   Procedure: BIOPSY OF MASS RIGHT RING FINGER;  Surgeon: Lamar LULLA Leonor Mickey., MD;  Location: [facility];  Service: Orthopedics;  Laterality: Right;   RIGHT/LEFT HEART CATH AND CORONARY ANGIOGRAPHY N/A 03/02/2017   Procedure: RIGHT/LEFT HEART CATH AND CORONARY ANGIOGRAPHY;  Surgeon: Cherrie Toribio SAUNDERS, MD;  Location: [facility];  Service: Cardiovascular;  Laterality: N/A;   SKIN FULL THICKNESS GRAFT Right 02/03/2013   Procedure: SKIN GRAFT FULL THICKNESS RIGHT RING FINGER;  Surgeon: Lamar LULLA Leonor Mickey., MD;  Location:  SURGERY CENTER;  Service: Orthopedics;  Laterality: Right;   Family History  Problem Relation Age of Onset   Cancer Father        Lung   Parkinson's disease Father    Renal Disease Brother        On Dialysis   Heart failure Brother    Hypertension Brother    Hypertension Mother  Stroke Brother    Social History   Occupational History   Occupation: Retired    Comment: Counsellor at [employer]  Tobacco Use   Smoking status: Every Day    Current packs/day: 0.50    Average packs/day: 0.5 packs/day for 52.0 years (26.0 ttl pk-yrs)    Types: Cigarettes   Smokeless tobacco: Never   Tobacco comments:    0.5 PPD  Vaping Use   Vaping status: Never Used  Substance and Sexual Activity   Alcohol use: Not Currently    Comment: 2 beers/week   Drug use: Yes    Types: Marijuana   Sexual activity: Not on file   Tobacco Counseling Ready to quit: Not Answered Counseling given: Not Answered Tobacco comments: 0.5 PPD  SDOH Screenings   Food Insecurity: No Food Insecurity (07/06/2024)   Housing: Unknown (07/06/2024)  Transportation Needs: No Transportation Needs (07/06/2024)  Utilities: Not At Risk (07/06/2024)  Alcohol Screen: Low Risk (07/06/2024)  Depression (PHQ2-9): Low Risk (07/06/2024)  Financial Resource Strain: Low Risk (07/06/2024)  Physical Activity: Sufficiently Active (07/06/2024)  Social Connections: Moderately Isolated (07/06/2024)  Stress: No Stress Concern Present (07/06/2024)  Tobacco Use: High Risk (07/06/2024)  Health Literacy: Adequate Health Literacy (07/06/2024)   See flowsheets for full screening details  Depression Screen PHQ 2 & 9 Depression Scale- Over the past 2 weeks, how often have you been bothered by any of the following problems? Little interest or pleasure in doing things: 0 Feeling down, depressed, or hopeless (PHQ Adolescent also includes...irritable): 0 PHQ-2 Total Score: 0 Trouble falling or staying asleep, or sleeping too much: 0 Feeling tired or having little energy: 0 Poor appetite or overeating (PHQ Adolescent also includes...weight loss): 0 Feeling bad about yourself - or that you are a failure or have let yourself or your family down: 0 Trouble concentrating on things, such as reading the newspaper or watching television (PHQ Adolescent also includes...like school work): 0 Moving or speaking so slowly that other people could have noticed. Or the opposite - being so fidgety or restless that you have been moving around a lot more than usual: 0 Thoughts that you would be better off dead, or of hurting yourself in some way: 0 PHQ-9 Total Score: 0 If you checked off any problems, how difficult have these problems made it for you to do your work, take care of things at home, or get along with other people?: Not difficult at all  Depression Treatment Depression Interventions/Treatment : EYV7-0 Score <4 Follow-up Not Indicated     Goals Addressed             This Visit's Progress    07/06/2024: Take care of myself and  maintain my health.               Objective:    Today's Vitals   07/06/24 1115  Weight: 165 lb (74.8 kg)  Height: 5' 5 (1.651 m)  PainSc: 0-No pain   Body mass index is 27.46 kg/m.  Hearing/Vision screen Hearing Screening - Comments:: Denies hearing difficulties.  Vision Screening - Comments:: Wears rx glasses - not -up to date with routine eye exams.  Immunizations and Health Maintenance Health Maintenance  Topic Date Due   FOOT EXAM  Never done   OPHTHALMOLOGY EXAM  Never done   Zoster Vaccines- Shingrix (1 of 2) Never done   COLON CANCER SCREENING ANNUAL FOBT  Never done   Lung Cancer Screening  Never done   Colonoscopy  03/09/2019  COVID-19 Vaccine (4 - 2025-26 season) 03/14/2024   DTaP/Tdap/Td (5 - Td or Tdap) 05/06/2024   Influenza Vaccine  10/11/2024 (Originally 02/12/2024)   HEMOGLOBIN A1C  10/21/2024   Diabetic kidney evaluation - Urine ACR  05/27/2025   Diabetic kidney evaluation - eGFR measurement  06/24/2025   Medicare Annual Wellness (AWV)  07/06/2025   Pneumococcal Vaccine: 50+ Years  Completed   Hepatitis C Screening  Completed   Meningococcal B Vaccine  Aged Out        Assessment/Plan:  This is a routine wellness examination for Jakell.  Patient Care Team: Karna Fellows, MD as PCP - General (Internal Medicine) Bensimhon, Toribio SAUNDERS, MD as Consulting Physician (Cardiology)  I have personally reviewed and noted the following in the patients chart:   Medical and social history Use of alcohol, tobacco or illicit drugs  Current medications and supplements including opioid prescriptions. Functional ability and status Nutritional status Physical activity Advanced directives List of other physicians Hospitalizations, surgeries, and ER visits in previous 12 months Vitals Screenings to include cognitive, depression, and falls Referrals and appointments  No orders of the defined types were placed in this encounter.  In addition, I have reviewed  and discussed with patient certain preventive protocols, quality metrics, and best practice recommendations. A written personalized care plan for preventive services as well as general preventive health recommendations were provided to patient.   Roz LOISE Fuller, LPN   [id number]   Return in about 1 year (around 07/06/2025) for Medicare wellness.  After Visit Summary: (MyChart) Due to this being a telephonic visit, the after visit summary with patients personalized plan was offered to patient via MyChart   HM Addressed: Vaccines Due: Covid-19, Shingrix Patient is overdue for Diabetic Eye Exa, Colon Cancer Screening (FOBT) and Diabetic Foot Exam.  "

## 2024-07-20 ENCOUNTER — Ambulatory Visit (HOSPITAL_COMMUNITY)

## 2024-07-22 ENCOUNTER — Other Ambulatory Visit (HOSPITAL_COMMUNITY): Payer: Self-pay

## 2024-07-22 NOTE — Progress Notes (Signed)
 "   Advanced Heart Failure Clinic Note   PCP:  Karna Fellows, MD             Cardiologist:  None Primary HF: Dr Cherrie   HPI:  Devon Vega is a 69 y.o. male with a history of with systolic CHF due to NICM, poorly controlled HTN, HLD, COPD, tobacco abuse, and alcohol abuse. Referred to Dr. Debera   Admitted 8/17-8/20/18 with acute dyspnea. Found to have marked hypertension in 180s and tachycardia in 110s. Medications adjusted with symptomatic improvement.  Echo with depressed EF.  Cath 03/02/17 with No CAD and low filling pressures. SABRA    ECHO 11/11/2017 which showed EF improving 40-45% and mod AI.   Echo 2020 EF 60-65%  Mild to moderate AI    Lost to f/u for several years.    Admitted in 04/2024 with recurrent and HTN crisis after stopping meds for at least 6 months. Echo EF 20-25% Diuresed and meds restarted.   Presented to [facility] Clinic 06/24/24 with Dr. Cherrie. Stated he felt pretty good. Reported smoking 1/2 ppd. No CP, edema, orthopnea or PND. Compliant with meds. ReDS 36%.  Today he returns to HF clinic for pharmacist medication titration. At last visit with MD, hydralazine  25 mg BID and Imdur  30 mg daily were initiated. Additionally, he was provided PRN Lasix . Overall he is doing well today. 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 No dizziness, lightheadedness, CP or palpitations. No SOB/DOE. Tries to stay active, gets out every day to socialize and walk to the store. Able to walk 0.5-1.0 mile before needing to rest. Does not weigh himself at home. Has not used any PRN Lasix . No LEE, PND or orthopnea. Appetite is good. Complete medication history completed in clinic today. He brought all his medication bottles to clinic. He had all of his medications; however, the last fill dates were 06/24/24 for most medications, 05/27/24 for Entresto  and 08/01/24 for Jardiance . Confirmed fill dates on bottles matched dispense history in Epic. At first he did not endorse missing any medications  and was able to correctly identify how he should take all of his medications. Confirmed he did not have extra bottles of any medications that he finished before using the most recent pill bottles except Entresto . Notes he thought he may have had an old bottle of Entresto  he was using (but fill history lists 04/25/2022 as the last fill before 05/27/24). After continued discussion, he states he does take his medications every day in the morning but will forget the evening dose frequently. He does not use a pillbox or an alarm. No issues getting or affording medications, his pharmacy is using a healthwell grant for his expensive medications. BP 162/70 today and states he took his medications this morning. Says BP at home is 130s/70.   HF Medications: - carvedilol  12.5 mg BID - Entresto  97/103 mg BID - spironolactone  25 mg daily - Jardiance  10 mg daily - hydralazine  25 mg BID and Imdur  30 mg daily - Lasix  20 mg PRN  Has the patient been experiencing any side effects to the medications prescribed?  no  Does the patient have any problems obtaining medications due to transportation or finances?   No; has [employer].  Understanding of regimen: fair Understanding of indications: fair Potential of compliance: fair Patient understands to avoid NSAIDs. Patient understands to avoid decongestants.    Pertinent Lab Values: Serum creatinine 1.06, BUN 12, Potassium 4.4, Sodium 136, BNP 57.3 Vital Signs:  Weight: 165.6 lbs (last clinic weight: 164 lbs) Blood pressure: 162/70  Heart rate: 73   Assessment/Plan: 1. Chronic systolic HF. NICM-.suspect HTN CM  - Echo 11/11/17: EF 40-45%, mod AI - R/LHC 03/02/17 with normal coronaries and low filling pressures.  - Echo 11/11/17: EF 40-45%, mod AI. - Echo 03/28/19: EF 60-65% mild-mod AI - Echo 04/2024 EF 20-25% mild AI/MR - NYHA I-II. Euvolemic on exam. - Continue Lasix  20 mg PRN only, has not needed - Continue carvedilol  12.5 mg  BID - Continue Entresto  97/103 mg BID - Continue spironolactone  25 mg daily - Continue Jardiance  10 mg daily - Continue hydralazine  25 mg BID and Imdur  30 mg daily - BP remains high but concerned he is missing the evening doses of his medications and likely full days based off dispense history and reviewing medication bottles he brought to clinic today. Will not increase any medications today due to compliance concerns. Provided him with pillbox today and we discussed setting an alarm, at least for his evening medications. He will try to take his medications consistently and correctly for the next two weeks. Will bring him back to pharmacy clinic and reassess at that time. Plan to increase hydralazine  and Imdur  if appropriate based on vitals next visit.    2. Hypertension - BP elevated. As above, concerned he is not taking his medications correctly and likely missing more days than he is reporting (or possibly unaware of how much he is missing). -Continue Entresto , carvedilol , hydralazine  and amlodipine . - As above, provided pillbox and discussed setting an alarm to improve adherence. Come back in two weeks to reassess. He checks his BP 2-3 times a week at home and I asked him to bring these values to his clinic visit, along with his medications.   3. Tobacco use - Smoking 1/2 ppd - Encouraged cessation    4. ETOH abuse - Drinks 2 Heinekens per day - suggested cutting back   Follow up 2 weeks with pharmacy clinic.   Tinnie Redman, PharmD, BCPS, BCCP, CPP Heart Failure Clinic Pharmacist [phone]   "

## 2024-07-28 ENCOUNTER — Other Ambulatory Visit (HOSPITAL_COMMUNITY): Payer: Self-pay

## 2024-08-03 ENCOUNTER — Ambulatory Visit (HOSPITAL_COMMUNITY)
Admission: RE | Admit: 2024-08-03 | Discharge: 2024-08-03 | Disposition: A | Discharge status: Home / Self Care | Source: Ambulatory Visit | Attending: Cardiology | Admitting: Cardiology

## 2024-08-03 VITALS — BP 162/70 | HR 73 | Wt 165.6 lb

## 2024-08-03 DIAGNOSIS — Z91148 Patient's other noncompliance with medication regimen for other reason: Secondary | ICD-10-CM | POA: Insufficient documentation

## 2024-08-03 DIAGNOSIS — I5022 Chronic systolic (congestive) heart failure: Secondary | ICD-10-CM

## 2024-08-03 DIAGNOSIS — M19049 Primary osteoarthritis, unspecified hand: Secondary | ICD-10-CM | POA: Diagnosis not present

## 2024-08-03 DIAGNOSIS — T465X6A Underdosing of other antihypertensive drugs, initial encounter: Secondary | ICD-10-CM | POA: Diagnosis not present

## 2024-08-03 DIAGNOSIS — J449 Chronic obstructive pulmonary disease, unspecified: Secondary | ICD-10-CM | POA: Diagnosis not present

## 2024-08-03 DIAGNOSIS — F17210 Nicotine dependence, cigarettes, uncomplicated: Secondary | ICD-10-CM | POA: Diagnosis not present

## 2024-08-03 DIAGNOSIS — F1010 Alcohol abuse, uncomplicated: Secondary | ICD-10-CM | POA: Insufficient documentation

## 2024-08-03 DIAGNOSIS — T461X6A Underdosing of calcium-channel blockers, initial encounter: Secondary | ICD-10-CM | POA: Insufficient documentation

## 2024-08-03 DIAGNOSIS — I110 Hypertensive heart disease with heart failure: Secondary | ICD-10-CM | POA: Insufficient documentation

## 2024-08-03 DIAGNOSIS — E785 Hyperlipidemia, unspecified: Secondary | ICD-10-CM | POA: Diagnosis not present

## 2024-08-03 DIAGNOSIS — T447X6A Underdosing of beta-adrenoreceptor antagonists, initial encounter: Secondary | ICD-10-CM | POA: Insufficient documentation

## 2024-08-03 DIAGNOSIS — I428 Other cardiomyopathies: Secondary | ICD-10-CM | POA: Diagnosis not present

## 2024-08-03 NOTE — Patient Instructions (Signed)
 It was a pleasure seeing you today!  MEDICATIONS: -Start taking Entresto , carvedilol  and hydralazine  twice daily -Call if you have questions about your medications.   NEXT APPOINTMENT: Return to clinic in 2 weeks with Pharmacy Clinic. Please bring your medications and your blood pressure log from home to the visit.   In general, to take care of your heart failure: -Limit your fluid intake to 2 Liters (half-gallon) per day.   -Limit your salt intake to ideally 2-3 grams (2000-3000 mg) per day. -Weigh yourself daily and record, and bring that weight diary to your next appointment.  (Weight gain of 2-3 pounds in 1 day typically means fluid weight.) -The medications for your heart are to help your heart and help you live longer.   -Please contact us  before stopping any of your heart medications.  Call the clinic at [phone] with questions or to reschedule future appointments.

## 2024-08-15 ENCOUNTER — Ambulatory Visit (HOSPITAL_COMMUNITY): Admission: RE | Admit: 2024-08-15 | Source: Ambulatory Visit

## 2024-08-23 ENCOUNTER — Ambulatory Visit (HOSPITAL_COMMUNITY)

## 2024-09-22 ENCOUNTER — Ambulatory Visit (HOSPITAL_COMMUNITY)

## 2024-09-22 ENCOUNTER — Other Ambulatory Visit (HOSPITAL_COMMUNITY)
